# Patient Record
Sex: Male | Born: 1965
Health system: Southern US, Community
[De-identification: ages and names within clinical notes are randomized; demographics above are authoritative.]

## PROBLEM LIST (undated history)

## (undated) DIAGNOSIS — I10 Essential (primary) hypertension: Secondary | ICD-10-CM

## (undated) DIAGNOSIS — E785 Hyperlipidemia, unspecified: Secondary | ICD-10-CM

## (undated) DIAGNOSIS — I2510 Atherosclerotic heart disease of native coronary artery without angina pectoris: Secondary | ICD-10-CM

## (undated) HISTORY — PX: RETINAL DETACHMENT SURGERY: SHX105

## (undated) HISTORY — DX: Essential (primary) hypertension: I10

## (undated) HISTORY — DX: Hyperlipidemia, unspecified: E78.5

## (undated) HISTORY — PX: OTHER SURGICAL HISTORY: SHX169

## (undated) HISTORY — PX: RHINOPLASTY: SUR1284

## (undated) HISTORY — PX: TONSILLECTOMY AND ADENOIDECTOMY: SHX28

---

## 1999-02-22 ENCOUNTER — Other Ambulatory Visit: Admission: RE | Admit: 1999-02-22 | Discharge: 1999-02-22 | Payer: Self-pay | Admitting: Gastroenterology

## 2001-07-31 ENCOUNTER — Emergency Department (HOSPITAL_COMMUNITY): Admission: EM | Admit: 2001-07-31 | Discharge: 2001-07-31 | Payer: Self-pay | Admitting: Emergency Medicine

## 2001-07-31 ENCOUNTER — Encounter: Payer: Self-pay | Admitting: Emergency Medicine

## 2004-05-22 ENCOUNTER — Ambulatory Visit: Payer: Self-pay | Admitting: Pulmonary Disease

## 2004-05-31 ENCOUNTER — Ambulatory Visit: Payer: Self-pay | Admitting: Pulmonary Disease

## 2004-06-25 ENCOUNTER — Ambulatory Visit: Payer: Self-pay | Admitting: Pulmonary Disease

## 2004-08-21 ENCOUNTER — Ambulatory Visit: Payer: Self-pay | Admitting: Pulmonary Disease

## 2004-09-26 ENCOUNTER — Ambulatory Visit: Payer: Self-pay | Admitting: Pulmonary Disease

## 2004-10-16 ENCOUNTER — Ambulatory Visit: Payer: Self-pay | Admitting: Cardiology

## 2004-10-22 ENCOUNTER — Ambulatory Visit: Payer: Self-pay | Admitting: Cardiology

## 2005-01-08 ENCOUNTER — Ambulatory Visit: Payer: Self-pay | Admitting: Internal Medicine

## 2005-01-17 ENCOUNTER — Ambulatory Visit: Payer: Self-pay | Admitting: Cardiology

## 2005-04-26 ENCOUNTER — Ambulatory Visit: Payer: Self-pay | Admitting: Internal Medicine

## 2005-05-29 ENCOUNTER — Ambulatory Visit: Payer: Self-pay | Admitting: Pulmonary Disease

## 2005-10-03 ENCOUNTER — Ambulatory Visit: Payer: Self-pay | Admitting: Pulmonary Disease

## 2005-10-08 ENCOUNTER — Ambulatory Visit: Payer: Self-pay | Admitting: Pulmonary Disease

## 2005-11-07 ENCOUNTER — Ambulatory Visit: Payer: Self-pay | Admitting: Gastroenterology

## 2005-11-13 ENCOUNTER — Ambulatory Visit: Payer: Self-pay | Admitting: Gastroenterology

## 2005-11-19 ENCOUNTER — Ambulatory Visit: Payer: Self-pay | Admitting: Gastroenterology

## 2006-04-24 ENCOUNTER — Ambulatory Visit: Payer: Self-pay | Admitting: Pulmonary Disease

## 2006-04-24 LAB — CONVERTED CEMR LAB
ALT: 46 units/L — ABNORMAL HIGH (ref 0–40)
AST: 41 units/L — ABNORMAL HIGH (ref 0–37)
Albumin: 4.2 g/dL (ref 3.5–5.2)
Alkaline Phosphatase: 68 units/L (ref 39–117)
BUN: 5 mg/dL — ABNORMAL LOW (ref 6–23)
Basophils Absolute: 0 10*3/uL (ref 0.0–0.1)
Basophils Relative: 0.1 % (ref 0.0–1.0)
Bilirubin, Direct: 0.2 mg/dL (ref 0.0–0.3)
CO2: 30 meq/L (ref 19–32)
Calcium: 8.7 mg/dL (ref 8.4–10.5)
Chloride: 105 meq/L (ref 96–112)
Cholesterol: 171 mg/dL (ref 0–200)
Creatinine, Ser: 0.7 mg/dL (ref 0.4–1.5)
Direct LDL: 97.9 mg/dL
Eosinophils Absolute: 0.1 10*3/uL (ref 0.0–0.6)
Eosinophils Relative: 1 % (ref 0.0–5.0)
GFR calc Af Amer: 162 mL/min
GFR calc non Af Amer: 134 mL/min
Glucose, Bld: 97 mg/dL (ref 70–99)
HCT: 44.6 % (ref 39.0–52.0)
HDL: 38.6 mg/dL — ABNORMAL LOW (ref >39.0)
Hemoglobin: 15.9 g/dL (ref 13.0–17.0)
Lymphocytes Relative: 26.1 % (ref 12.0–46.0)
MCHC: 35.7 g/dL (ref 30.0–36.0)
MCV: 92.6 fL (ref 78.0–100.0)
Monocytes Absolute: 0.2 10*3/uL (ref 0.2–0.7)
Monocytes Relative: 4.8 % (ref 3.0–11.0)
Neutro Abs: 3.5 10*3/uL (ref 1.4–7.7)
Neutrophils Relative %: 68 % (ref 43.0–77.0)
Platelets: 142 10*3/uL — ABNORMAL LOW (ref 150–400)
Potassium: 4 meq/L (ref 3.5–5.1)
RBC: 4.81 M/uL (ref 4.22–5.81)
RDW: 11.6 % (ref 11.5–14.6)
Sodium: 141 meq/L (ref 135–145)
TSH: 0.87 microintl units/mL (ref 0.35–5.50)
Total Bilirubin: 0.7 mg/dL (ref 0.3–1.2)
Total CHOL/HDL Ratio: 4.4
Total Protein: 6.9 g/dL (ref 6.0–8.3)
Triglycerides: 367 mg/dL (ref 0–149)
VLDL: 73 mg/dL — ABNORMAL HIGH (ref 0–40)
WBC: 5.2 10*3/uL (ref 4.5–10.5)

## 2006-05-07 ENCOUNTER — Ambulatory Visit: Payer: Self-pay | Admitting: Gastroenterology

## 2006-11-03 ENCOUNTER — Ambulatory Visit: Payer: Self-pay | Admitting: Gastroenterology

## 2006-11-14 ENCOUNTER — Encounter: Payer: Self-pay | Admitting: Gastroenterology

## 2006-11-14 ENCOUNTER — Ambulatory Visit: Payer: Self-pay | Admitting: Pulmonary Disease

## 2006-11-14 ENCOUNTER — Ambulatory Visit: Payer: Self-pay | Admitting: Gastroenterology

## 2006-11-14 LAB — CONVERTED CEMR LAB
ALT: 47 units/L (ref 0–53)
AST: 36 units/L (ref 0–37)
Albumin: 4.6 g/dL (ref 3.5–5.2)
Alkaline Phosphatase: 65 units/L (ref 39–117)
Bilirubin, Direct: 0.2 mg/dL (ref 0.0–0.3)
Cholesterol: 206 mg/dL (ref 0–200)
Direct LDL: 167.2 mg/dL
HDL: 30.7 mg/dL — ABNORMAL LOW (ref >39.0)
Total Bilirubin: 1.1 mg/dL (ref 0.3–1.2)
Total CHOL/HDL Ratio: 6.7
Total Protein: 7.8 g/dL (ref 6.0–8.3)
Triglycerides: 215 mg/dL (ref 0–149)
VLDL: 43 mg/dL — ABNORMAL HIGH (ref 0–40)

## 2007-11-06 ENCOUNTER — Telehealth: Payer: Self-pay | Admitting: Pulmonary Disease

## 2007-11-11 DIAGNOSIS — I10 Essential (primary) hypertension: Secondary | ICD-10-CM | POA: Insufficient documentation

## 2007-11-11 DIAGNOSIS — E785 Hyperlipidemia, unspecified: Secondary | ICD-10-CM | POA: Insufficient documentation

## 2007-11-11 DIAGNOSIS — K219 Gastro-esophageal reflux disease without esophagitis: Secondary | ICD-10-CM | POA: Insufficient documentation

## 2007-11-11 DIAGNOSIS — F419 Anxiety disorder, unspecified: Secondary | ICD-10-CM | POA: Insufficient documentation

## 2007-11-11 DIAGNOSIS — J209 Acute bronchitis, unspecified: Secondary | ICD-10-CM | POA: Insufficient documentation

## 2007-11-11 DIAGNOSIS — F411 Generalized anxiety disorder: Secondary | ICD-10-CM | POA: Insufficient documentation

## 2007-11-12 ENCOUNTER — Ambulatory Visit: Payer: Self-pay | Admitting: Pulmonary Disease

## 2007-11-15 DIAGNOSIS — M542 Cervicalgia: Secondary | ICD-10-CM | POA: Insufficient documentation

## 2007-11-15 DIAGNOSIS — N4889 Other specified disorders of penis: Secondary | ICD-10-CM | POA: Insufficient documentation

## 2007-11-15 DIAGNOSIS — N486 Induration penis plastica: Secondary | ICD-10-CM | POA: Insufficient documentation

## 2007-11-15 DIAGNOSIS — Z86010 Personal history of colonic polyps: Secondary | ICD-10-CM | POA: Insufficient documentation

## 2007-11-15 DIAGNOSIS — D126 Benign neoplasm of colon, unspecified: Secondary | ICD-10-CM | POA: Insufficient documentation

## 2007-11-16 LAB — CONVERTED CEMR LAB
ALT: 60 units/L — ABNORMAL HIGH (ref 0–53)
AST: 36 units/L (ref 0–37)
Albumin: 4.7 g/dL (ref 3.5–5.2)
Alkaline Phosphatase: 32 units/L — ABNORMAL LOW (ref 39–117)
BUN: 9 mg/dL (ref 6–23)
Basophils Absolute: 0.1 10*3/uL (ref 0.0–0.1)
Basophils Relative: 1.4 % (ref 0.0–3.0)
Bilirubin, Direct: 0.1 mg/dL (ref 0.0–0.3)
CO2: 29 meq/L (ref 19–32)
Calcium: 9.5 mg/dL (ref 8.4–10.5)
Chloride: 104 meq/L (ref 96–112)
Cholesterol: 293 mg/dL (ref 0–200)
Creatinine, Ser: 1.1 mg/dL (ref 0.4–1.5)
Direct LDL: 187.9 mg/dL
Eosinophils Absolute: 0.1 10*3/uL (ref 0.0–0.7)
Eosinophils Relative: 1.3 % (ref 0.0–5.0)
GFR calc Af Amer: 95 mL/min
GFR calc non Af Amer: 79 mL/min
Glucose, Bld: 102 mg/dL — ABNORMAL HIGH (ref 70–99)
HCT: 45.6 % (ref 39.0–52.0)
HDL: 36.1 mg/dL — ABNORMAL LOW (ref >39.0)
Hemoglobin: 16.1 g/dL (ref 13.0–17.0)
Lymphocytes Relative: 18.5 % (ref 12.0–46.0)
MCHC: 35.3 g/dL (ref 30.0–36.0)
MCV: 96 fL (ref 78.0–100.0)
Monocytes Absolute: 0.4 10*3/uL (ref 0.1–1.0)
Monocytes Relative: 7.5 % (ref 3.0–12.0)
Neutro Abs: 4 10*3/uL (ref 1.4–7.7)
Neutrophils Relative %: 71.3 % (ref 43.0–77.0)
Platelets: 142 10*3/uL — ABNORMAL LOW (ref 150–400)
Potassium: 4.1 meq/L (ref 3.5–5.1)
RBC: 4.75 M/uL (ref 4.22–5.81)
RDW: 11.2 % — ABNORMAL LOW (ref 11.5–14.6)
Sodium: 139 meq/L (ref 135–145)
TSH: 1.14 microintl units/mL (ref 0.35–5.50)
Total Bilirubin: 1.4 mg/dL — ABNORMAL HIGH (ref 0.3–1.2)
Total CHOL/HDL Ratio: 8.1
Total Protein: 7.6 g/dL (ref 6.0–8.3)
Triglycerides: 280 mg/dL (ref 0–149)
VLDL: 56 mg/dL — ABNORMAL HIGH (ref 0–40)
WBC: 5.7 10*3/uL (ref 4.5–10.5)

## 2007-12-16 ENCOUNTER — Telehealth: Payer: Self-pay | Admitting: Pulmonary Disease

## 2009-02-01 ENCOUNTER — Telehealth: Payer: Self-pay | Admitting: Pulmonary Disease

## 2009-05-03 ENCOUNTER — Ambulatory Visit: Payer: Self-pay | Admitting: Pulmonary Disease

## 2009-05-04 ENCOUNTER — Ambulatory Visit: Payer: Self-pay | Admitting: Pulmonary Disease

## 2009-05-08 LAB — CONVERTED CEMR LAB
ALT: 41 units/L (ref 0–53)
AST: 30 units/L (ref 0–37)
Albumin: 4.3 g/dL (ref 3.5–5.2)
Alkaline Phosphatase: 57 units/L (ref 39–117)
BUN: 5 mg/dL — ABNORMAL LOW (ref 6–23)
Basophils Absolute: 0 10*3/uL (ref 0.0–0.1)
Basophils Relative: 0.8 % (ref 0.0–3.0)
Bilirubin Urine: NEGATIVE
Bilirubin, Direct: 0.2 mg/dL (ref 0.0–0.3)
CO2: 31 meq/L (ref 19–32)
Calcium: 9 mg/dL (ref 8.4–10.5)
Chloride: 105 meq/L (ref 96–112)
Cholesterol: 167 mg/dL (ref 0–200)
Creatinine, Ser: 0.8 mg/dL (ref 0.4–1.5)
Direct LDL: 86.9 mg/dL
Eosinophils Absolute: 0.1 10*3/uL (ref 0.0–0.7)
Eosinophils Relative: 1.6 % (ref 0.0–5.0)
GFR calc non Af Amer: 112.87 mL/min (ref >60)
Glucose, Bld: 97 mg/dL (ref 70–99)
HCT: 45.1 % (ref 39.0–52.0)
HDL: 39.7 mg/dL (ref >39.00)
Hemoglobin, Urine: NEGATIVE
Hemoglobin: 15.4 g/dL (ref 13.0–17.0)
Ketones, ur: NEGATIVE mg/dL
Leukocytes, UA: NEGATIVE
Lymphocytes Relative: 31 % (ref 12.0–46.0)
Lymphs Abs: 1.3 10*3/uL (ref 0.7–4.0)
MCHC: 34.1 g/dL (ref 30.0–36.0)
MCV: 97.5 fL (ref 78.0–100.0)
Monocytes Absolute: 0.4 10*3/uL (ref 0.1–1.0)
Monocytes Relative: 8.4 % (ref 3.0–12.0)
Neutro Abs: 2.5 10*3/uL (ref 1.4–7.7)
Neutrophils Relative %: 58.2 % (ref 43.0–77.0)
Nitrite: NEGATIVE
Platelets: 166 10*3/uL (ref 150.0–400.0)
Potassium: 4 meq/L (ref 3.5–5.1)
RBC: 4.62 M/uL (ref 4.22–5.81)
RDW: 11.4 % — ABNORMAL LOW (ref 11.5–14.6)
Sodium: 143 meq/L (ref 135–145)
Specific Gravity, Urine: 1.02 (ref 1.000–1.030)
TSH: 1.43 microintl units/mL (ref 0.35–5.50)
Total Bilirubin: 0.8 mg/dL (ref 0.3–1.2)
Total CHOL/HDL Ratio: 4
Total Protein, Urine: NEGATIVE mg/dL
Total Protein: 7.1 g/dL (ref 6.0–8.3)
Triglycerides: 410 mg/dL — ABNORMAL HIGH (ref 0.0–149.0)
Urine Glucose: NEGATIVE mg/dL
Urobilinogen, UA: 0.2 (ref 0.0–1.0)
VLDL: 82 mg/dL — ABNORMAL HIGH (ref 0.0–40.0)
WBC: 4.3 10*3/uL — ABNORMAL LOW (ref 4.5–10.5)
pH: 5.5 (ref 5.0–8.0)

## 2010-04-17 NOTE — Assessment & Plan Note (Signed)
Summary: cpx/fasting/apc   CC:  18 month ROV & CPX....  History of Present Illness: 45 y/o WM here for a follow up visit and CPX... he states that he is feeling well, and has no new complaints or concerns today... he does not take Flu shots & requests refills for 2011.    Current Problem List:  ASTHMATIC BRONCHITIS, ACUTE (ICD-466.0) - no recent exas and doing well- denies cough, sputum, hemoptysis, worsening dyspnea,  wheezing, chest pains, snoring, daytime hypersomnolence, etc...  HYPERTENSION (ICD-401.9) - on ASA 81mg /d,  TOPROL XL 50mg /d... BP today= 120/82, home BPs are <140 he says... denies HA, fatigue, visual changes, CP, palipit, dizziness, syncope, dyspnea, edema, etc... we discussed taking med daily & continue home monitoring...  HYPERLIPIDEMIA (ICD-272.4) - on PRAVACHOL 80mg /d now... [facility].  ~  FLP 8/08 ?on Prav40 & Antara130 showed TChol 206, TG 215, HDL 31, LDL 167  ~  FLP 8/09 ?on Prav40 showed TChol 293, TG 280, HDL 36, LDL 188... rec> incr Prav80.  ~  FLP 2/11 on Prav80 showed TChol 167, TG 410, HDL 40, LDL 87... rec> add Fenofib160.  GERD (ICD-530.81) - on PROTONIX 40mg /d...  ~  last EGD 9/07 by Dr Arlyce Dice was WNL...  COLONIC POLYPS (ICD-211.3) - colonoscopy 8/08 by Dorris Singh showed several 5mm polyps, otherw neg... bx= adenomatous w/ f/u planned 53yrs.  PEYRONIE'S DISEASE (ICD-607.89) - he saw DrDahlstedt in 1998 for poss early peyronies...  Hx of NECK PAIN (ICD-723.1) - eval by DrWeymann in 2005 for paresthesias... ?MRI's done- we never received f/u note.  ANXIETY (ICD-300.00) - on ALPRAZOLAM 0.5mg  Prn...  Health Maintenance:    ~  GI:  had colon 8/08 by Dorris Singh w/ 5mm polyp, f/u planned 29yrs...  ~  GU:  prostate exam= norm, 2+ no nodules, stool heme neg...  ~  Immunizations:  ?last tetanus shot, he refuses Flu vaccines...   Allergies: 1)  ! Codeine 2)  ! Penicillin 3)  ! Crestor (Rosuvastatin Calcium) 4)  ! Niaspan (Niacin  (Antihyperlipidemic))  Comments:  Nurse/Medical Assistant: The patient's medications and allergies were reviewed with the patient and were updated in the Medication and Allergy Lists.  Past History:  Past Medical History:  ASTHMATIC BRONCHITIS, ACUTE (ICD-466.0) HYPERTENSION (ICD-401.9) HYPERLIPIDEMIA (ICD-272.4) GERD (ICD-530.81) COLONIC POLYPS (ICD-211.3) PEYRONIE'S DISEASE (ICD-607.89) Hx of NECK PAIN (ICD-723.1) ANXIETY (ICD-300.00)  Past Surgical History: S/P vasectomy by DrTannenbaum  Family History: Reviewed history from 11/12/2007 and no changes required. mother alive age 35 father alive age 43 1 sibling alive age 60  Social History: Reviewed history from 11/12/2007 and no changes required. employed at [employer] non smoker - exposed to second hand smoke exercises 3 times per week no caffeine use etoh---social married 3 children  Review of Systems  The patient denies fever, chills, sweats, anorexia, fatigue, weakness, malaise, weight loss, sleep disorder, blurring, diplopia, eye irritation, eye discharge, vision loss, eye pain, photophobia, earache, ear discharge, tinnitus, decreased hearing, nasal congestion, nosebleeds, sore throat, hoarseness, chest pain, palpitations, syncope, dyspnea on exertion, orthopnea, PND, peripheral edema, cough, dyspnea at rest, excessive sputum, hemoptysis, wheezing, pleurisy, nausea, vomiting, diarrhea, constipation, change in bowel habits, abdominal pain, melena, hematochezia, jaundice, gas/bloating, indigestion/heartburn, dysphagia, odynophagia, dysuria, hematuria, urinary frequency, urinary hesitancy, nocturia, incontinence, back pain, joint pain, joint swelling, muscle cramps, muscle weakness, stiffness, arthritis, sciatica, restless legs, leg pain at night, leg pain with exertion, rash, itching, dryness, suspicious lesions, paralysis, paresthesias, seizures, tremors, vertigo, transient blindness, frequent falls, frequent  headaches, difficulty walking, depression,  anxiety, memory loss, confusion, cold intolerance, heat intolerance, polydipsia, polyphagia, polyuria, unusual weight change, abnormal bruising, bleeding, enlarged lymph nodes, urticaria, allergic rash, hay fever, and recurrent infections.    Vital Signs:  Patient profile:   45 year old male Height:      71 inches Weight:      175 pounds BMI:     24.50 O2 Sat:      97 % on Room air Temp:     99.1 degrees F oral Pulse rate:   88 / minute BP sitting:   120 / 82  (right arm) Cuff size:   regular  Vitals Entered By: Randell Loop CMA (May 03, 2009 9:36 AM)  O2 Sat at Rest %:  97 O2 Flow:  Room air CC: 18 month ROV & CPX... Is Patient Diabetic? No Pain Assessment Patient in pain? no      Comments no changes in meds today   Physical Exam  Additional Exam:  WD, WN, 45 y/o WM in NAD... GENERAL:  Alert & oriented; pleasant & cooperative... HEENT:  [place]/AT, EOM-wnl, PERRLA, Fundi-benign, EACs-clear, TMs-wnl, NOSE-clear, THROAT-clear & wnl. NECK:  Supple w/ full ROM; no JVD; normal carotid impulses w/o bruits; no thyromegaly or nodules palpated; no lymphadenopathy. CHEST:  Clear to P & A; without wheezes/ rales/ or rhonchi. HEART:  Regular Rhythm; without murmurs/ rubs/ or gallops. ABDOMEN:  Soft & nontender; normal bowel sounds; no organomegaly or masses detected. RECTAL:  Neg - prostate 2+ & nontender w/o nodules; stool hematest neg. EXT: without deformities or arthritic changes; no varicose veins/ venous insuffic/ or edema. NEURO:  CN's intact; motor testing normal; sensory testing normal; gait normal & balance OK. DERM:  No lesions noted; no rash etc...    CXR  Procedure date:  05/03/2009  Findings:      CHEST - 2 VIEW Comparison: 11/12/2007   Findings:  The heart size and mediastinal contours are within normal limits.  Both lungs are clear.  The visualized skeletal structures are unremarkable.   IMPRESSION: No active  cardiopulmonary disease.   Read By:  Sigurd Sos.,  M.D.   EKG  Procedure date:  05/03/2009  Findings:      Normal sinus rhythm with rate of:  82/min... Tracing is WNL w/ minor NSSTTWA... NAD... SN   MISC. Report  Procedure date:  05/04/2009  Findings:      BMP (METABOL)   Sodium                    143 mEq/L                   135-145   Potassium                 4.0 mEq/L                   3.5-5.1   Chloride                  105 mEq/L                   96-112   Carbon Dioxide            31 mEq/L                    19-32   Glucose                   97 mg/dL  70-99   BUN                  [L]  5 mg/dL                     1-61   Creatinine                0.8 mg/dL                   0.9-6.0   Calcium                   9.0 mg/dL                   4.5-40.9   GFR                       112.87 mL/min               >60  Lipid Panel (LIPID)   Cholesterol               167 mg/dL                   8-119   Triglycerides        [H]  410.0 mg/dL                 1.4-782.9   HDL                       56.21 mg/dL                 >30.86 Cholesterol LDL - Direct                             86.9 mg/dL  CBC Platelet w/Diff (CBCD)   White Cell Count     [L]  4.3 K/uL                    4.5-10.5   Red Cell Count            4.62 Mil/uL                 4.22-5.81   Hemoglobin                15.4 g/dL                   57.8-46.9   Hematocrit                45.1 %                      39.0-52.0   MCV                       97.5 fl                     78.0-100.0   Platelet Count            166.0 K/uL                  150.0-400.0   Neutrophil %              58.2 %                      43.0-77.0  Lymphocyte %              31.0 %                      12.0-46.0   Monocyte %                8.4 %                       3.0-12.0   Eosinophils%              1.6 %                       0.0-5.0   Basophils %               0.8 %    Comments:      Hepatic/Liver Function Panel (HEPATIC)    Total Bilirubin           0.8 mg/dL                   2.1-3.0   Direct Bilirubin          0.2 mg/dL                   8.6-5.7   Alkaline Phosphatase      57 U/L                      39-117   AST                       30 U/L                      0-37   ALT                       41 U/L                      0-53   Total Protein             7.1 g/dL                    8.4-6.9   Albumin                   4.3 g/dL                    6.2-9.5  TSH (TSH)   FastTSH                   1.43 uIU/mL                 0.35-5.50  UDip Only (UDIP)   Color                     YELLOW   Clarity                   CLEAR                       Clear   Specific Gravity          1.020                       1.000 - 1.030   Urine Ph  5.5                         5.0-8.0   Protein                   NEGATIVE                    Negative   Urine Glucose             NEGATIVE                    Negative   Ketones                   NEGATIVE                    Negative   Urine Bilirubin           NEGATIVE                    Negative   Blood                     NEGATIVE                    Negative   Urobilinogen              0.2                         0.0 - 1.0   Leukocyte Esterace        NEGATIVE                    Negative   Nitrite                   NEGATIVE                    Negative   Impression & Recommendations:  Problem # 1:  PHYSICAL EXAMINATION (ICD-V70.0)  Orders: EKG w/ Interpretation (93000) T-2 View CXR (71020TC) He will ret for Fasting labs...  Problem # 2:  HYPERTENSION (ICD-401.9) Controlled-  same meds. His updated medication list for this problem includes:    Toprol Xl 50 Mg Xr24h-tab (Metoprolol succinate) .Marland Kitchen... Take 1 by mouth once daily  Problem # 3:  HYPERLIPIDEMIA (ICD-272.4) Improved on Prav80 + diet-  continue same. His updated medication list for this problem includes:    Pravachol 80 Mg Tabs (Pravastatin sodium) .Marland Kitchen... Take one tablet by mouth at bedtime    Fenofibrate  160 Mg Tabs (Fenofibrate) .Marland Kitchen... Take 1 tablet by mouth once a day  Problem # 4:  GERD (ICD-530.81) GI is stable on the Protonix... His updated medication list for this problem includes:    Protonix 40 Mg Tbec (Pantoprazole sodium) .Marland Kitchen... Take 1 tablet by mouth once a day  Problem # 5:  COLONIC POLYPS (ICD-211.3) F/u colon due 2013...  Problem # 6:  OTHER MEDICAL PROBLEMS AS NOTED>>>  Complete Medication List: 1)  Toprol Xl 50 Mg Xr24h-tab (Metoprolol succinate) .... Take 1 by mouth once daily 2)  Pravachol 80 Mg Tabs (Pravastatin sodium) .... Take one tablet by mouth at bedtime 3)  Protonix 40 Mg Tbec (Pantoprazole sodium) .... Take 1 tablet by mouth once a day 4)  Multivitamins Tabs (Multiple vitamin) .... Take 1 tablet by mouth once a day 5)  Alprazolam 0.5  Mg Tabs (Alprazolam) .... Take 1/2 to 1 tablet three times a day as needed 6)  Fenofibrate 160 Mg Tabs (Fenofibrate) .... Take 1 tablet by mouth once a day  Other Orders: Prescription Created Electronically [id number])  Patient Instructions: 1)  Today we updated your med list- see below.... 2)  We refilled your meds for 2011... 3)  Today we did your follow up CXR & EKG... 4)  Please return to our lab in the AM for your FASTING blood work... 5)  then please call the "phone tree" in a few days for your lab results.Marland KitchenMarland Kitchen 6)  Be sure to follow that low cholesterol/ low fat diet.Marland KitchenMarland Kitchen 7)  Call for any questions... 8)  Please schedule a follow-up appointment in 1 year. Prescriptions: ALPRAZOLAM 0.5 MG TABS (ALPRAZOLAM) Take 1/2 to 1 tablet three times a day as needed  #100 x prn   Entered and Authorized by:   Michele Mcalpine MD   Signed by:   Michele Mcalpine MD on 05/03/2009   Method used:   Print then Give to Patient   RxID:   [id number] PROTONIX 40 MG TBEC (PANTOPRAZOLE SODIUM) Take 1 tablet by mouth once a day  #30 x prn   Entered and Authorized by:   Michele Mcalpine MD   Signed by:   Michele Mcalpine MD on 05/03/2009   Method used:    Print then Give to Patient   RxID:   [id number] PRAVACHOL 80 MG TABS (PRAVASTATIN SODIUM) take one tablet by mouth at bedtime  #30 x prn   Entered and Authorized by:   Michele Mcalpine MD   Signed by:   Michele Mcalpine MD on 05/03/2009   Method used:   Print then Give to Patient   RxID:   [id number] TOPROL XL 50 MG XR24H-TAB (METOPROLOL SUCCINATE) take 1 by mouth once daily  #30 x prn   Entered and Authorized by:   Michele Mcalpine MD   Signed by:   Michele Mcalpine MD on 05/03/2009   Method used:   Print then Give to Patient   RxID:   [id number]     CardioPerfect ECG  ID: [id number] Patient: Kathe Becton A DOB: June 19, 1965 Age: 45 Years Old Sex: Male Race: White Physician: Minnie Shi Technician: Randell Loop CMA Height: 71 Weight: 175 Status: Unconfirmed Past Medical History:   ASTHMATIC BRONCHITIS, ACUTE (ICD-466.0) HYPERTENSION (ICD-401.9) HYPERLIPIDEMIA (ICD-272.4) GERD (ICD-530.81) COLONIC POLYPS (ICD-211.3) PEYRONIE'S DISEASE (ICD-607.89) Hx of NECK PAIN (ICD-723.1) ANXIETY (ICD-300.00)   Recorded: 05/03/2009 09:55 AM P/PR: 112 ms / 155 ms - Heart rate (maximum exercise) QRS: 85 QT/QTc/QTd: 363 ms / 402 ms / 47 ms - Heart rate (maximum exercise)  P/QRS/T axis: 50 deg / 34 deg / 44 deg - Heart rate (maximum exercise)  Heartrate: 82 bpm  Interpretation:  Normal sinus rhythm with rate of:  82/min... Tracing is WNL w/ minor NSSTTWA... NAD... SN

## 2010-05-23 ENCOUNTER — Other Ambulatory Visit: Payer: Self-pay | Admitting: Pulmonary Disease

## 2010-05-23 ENCOUNTER — Encounter: Payer: Self-pay | Admitting: Pulmonary Disease

## 2010-05-23 ENCOUNTER — Encounter (INDEPENDENT_AMBULATORY_CARE_PROVIDER_SITE_OTHER): Payer: Private Health Insurance - Indemnity | Admitting: Pulmonary Disease

## 2010-05-23 ENCOUNTER — Other Ambulatory Visit: Payer: Private Health Insurance - Indemnity

## 2010-05-23 ENCOUNTER — Ambulatory Visit (INDEPENDENT_AMBULATORY_CARE_PROVIDER_SITE_OTHER)
Admission: RE | Admit: 2010-05-23 | Discharge: 2010-05-23 | Disposition: A | Discharge status: Home / Self Care | Payer: Private Health Insurance - Indemnity | Source: Ambulatory Visit | Attending: Pulmonary Disease | Admitting: Pulmonary Disease

## 2010-05-23 DIAGNOSIS — H349 Unspecified retinal vascular occlusion: Secondary | ICD-10-CM | POA: Insufficient documentation

## 2010-05-23 DIAGNOSIS — Z0000 Encounter for general adult medical examination without abnormal findings: Secondary | ICD-10-CM

## 2010-05-23 DIAGNOSIS — E785 Hyperlipidemia, unspecified: Secondary | ICD-10-CM

## 2010-05-23 LAB — CBC WITH DIFFERENTIAL/PLATELET
Basophils Absolute: 0 10*3/uL (ref 0.0–0.1)
Eosinophils Relative: 0.7 % (ref 0.0–5.0)
Lymphs Abs: 1.1 10*3/uL (ref 0.7–4.0)
Monocytes Absolute: 0.6 10*3/uL (ref 0.1–1.0)
Monocytes Relative: 8.6 % (ref 3.0–12.0)
Neutrophils Relative %: 74.3 % (ref 43.0–77.0)
Platelets: 172 10*3/uL (ref 150.0–400.0)
RDW: 11.8 % (ref 11.5–14.6)
WBC: 7 10*3/uL (ref 4.5–10.5)

## 2010-05-23 LAB — LDL CHOLESTEROL, DIRECT: Direct LDL: 149.6 mg/dL

## 2010-05-23 LAB — LIPID PANEL
Cholesterol: 211 mg/dL — ABNORMAL HIGH (ref 0–200)
Total CHOL/HDL Ratio: 7
Triglycerides: 313 mg/dL — ABNORMAL HIGH (ref 0.0–149.0)
VLDL: 62.6 mg/dL — ABNORMAL HIGH (ref 0.0–40.0)

## 2010-05-23 LAB — URINALYSIS, ROUTINE W REFLEX MICROSCOPIC
Hgb urine dipstick: NEGATIVE
Nitrite: NEGATIVE
Total Protein, Urine: NEGATIVE
Urine Glucose: NEGATIVE
pH: 6.5 (ref 5.0–8.0)

## 2010-05-23 LAB — HEPATIC FUNCTION PANEL
ALT: 73 U/L — ABNORMAL HIGH (ref 0–53)
AST: 85 U/L — ABNORMAL HIGH (ref 0–37)
Albumin: 4.7 g/dL (ref 3.5–5.2)
Alkaline Phosphatase: 41 U/L (ref 39–117)

## 2010-05-23 LAB — BASIC METABOLIC PANEL
BUN: 8 mg/dL (ref 6–23)
CO2: 27 mEq/L (ref 19–32)
Chloride: 101 mEq/L (ref 96–112)
Glucose, Bld: 139 mg/dL — ABNORMAL HIGH (ref 70–99)
Potassium: 4.7 mEq/L (ref 3.5–5.1)
Sodium: 138 mEq/L (ref 135–145)

## 2010-05-28 ENCOUNTER — Other Ambulatory Visit: Payer: Self-pay | Admitting: Pulmonary Disease

## 2010-05-28 ENCOUNTER — Encounter: Payer: Self-pay | Admitting: Pulmonary Disease

## 2010-05-28 DIAGNOSIS — R911 Solitary pulmonary nodule: Secondary | ICD-10-CM

## 2010-05-31 ENCOUNTER — Ambulatory Visit (INDEPENDENT_AMBULATORY_CARE_PROVIDER_SITE_OTHER)
Admission: RE | Admit: 2010-05-31 | Discharge: 2010-05-31 | Disposition: A | Discharge status: Home / Self Care | Payer: Private Health Insurance - Indemnity | Source: Ambulatory Visit | Attending: Pulmonary Disease | Admitting: Pulmonary Disease

## 2010-05-31 DIAGNOSIS — R911 Solitary pulmonary nodule: Secondary | ICD-10-CM

## 2010-05-31 DIAGNOSIS — J984 Other disorders of lung: Secondary | ICD-10-CM

## 2010-05-31 MED ORDER — IOHEXOL 300 MG/ML  SOLN
80.0000 mL | Freq: Once | INTRAMUSCULAR | Status: AC | PRN
  Administered 2010-05-31: 80 mL via INTRAVENOUS

## 2010-06-04 ENCOUNTER — Telehealth: Payer: Self-pay | Admitting: Pulmonary Disease

## 2010-06-04 ENCOUNTER — Other Ambulatory Visit: Payer: Self-pay | Admitting: Pulmonary Disease

## 2010-06-04 DIAGNOSIS — R911 Solitary pulmonary nodule: Secondary | ICD-10-CM

## 2010-06-05 NOTE — Assessment & Plan Note (Signed)
Summary: CPX//SH   CC:  Yearly ROV & CPX....  History of Present Illness: 45 y/o WM here for a follow up visit and CPX...   ~  May 23, 2010:  Carlos Montgomery reports an episode of blurry vision involving his right eye in 2011; he went to see his Optometrist [name] who saw a "bl clot behind my eye" & [name] who dx a ?central retinal vein occlusion (we do not have records);  treated initially w/ series of 3 injections (no benefit), then laser surg & it has stabilized; pt says "it's from HBP" but his BP has been mild & easily treated w/ simple med (on ToprolXL50 & BP last yr 120/82, this yr 132/84, & his hi reading at home was 138/90);  Omran does not have prev hx blood clots, no known hypercoag state, he is not obese, not a known diabetic (FBS=97 last yr), & an ex-smoker (quit 2000)- neg FamHx for blood clots or similar problems...  he does have a hx of a mixed hyperlipidemia on PRAV80 & FENOFIB160> while his Chol numbers have been good (TChol=167, LDL=87 last yr), his TG's have been elevated (TG 2/11 = 410) & he has struggled w/ low fat diet...  f/u fasting labs ==> pending   Current Problem List  RETINAL VEIN OCCLUSION, Right Eye (ICD-362.30) >> SEE ABOVE  ASTHMATIC BRONCHITIS, ACUTE (ICD-466.0) - no recent exac and doing well- denies cough, sputum, hemoptysis, worsening dyspnea,  wheezing, chest pains, snoring, daytime hypersomnolence, etc...  HYPERTENSION (ICD-401.9) - on ASA 81mg /d,  TOPROL XL 50mg /d... BP today= 132/84, home BPs are <140 he says... denies HA, fatigue, CP, palipit, dizziness, syncope, dyspnea, edema, etc... we discussed taking med daily & continue home monitoring...  HYPERLIPIDEMIA (ICD-272.4) - on PRAVACHOL 80mg /d & FENOFIBRATE 160mg /d... prev [facility] & INTOL to Crestor & Niaspan...  ~  FLP 8/08 ?on Prav40 & Antara130 showed TChol 206, TG 215, HDL 31, LDL 167  ~  FLP 8/09 ?on Prav40 showed TChol 293, TG 280, HDL 36, LDL 188... rec>  incr Prav80.  ~  FLP 2/11 on Prav80 showed TChol 167, TG 410, HDL 40, LDL 87... rec> add Fenofib160.  ~  FLP 3/12 on Prav80+Feno160 showed ==> pending  GERD (ICD-530.81) - on PROTONIX 40mg /d...  ~  last EGD 9/07 by Dr Arlyce Dice was WNL...  COLONIC POLYPS (ICD-211.3) - colonoscopy 8/08 by Dorris Singh showed several 5mm polyps, otherw neg... bx= adenomatous w/ f/u planned 84yrs.  PEYRONIE'S DISEASE (ICD-607.89) - he saw DrDahlstedt in 1998 for poss early peyronies...  Hx of NECK PAIN (ICD-723.1) - eval by DrWeymann in 2005 for paresthesias... ?MRI's done- we never received f/u note.  ANXIETY (ICD-300.00) - on ALPRAZOLAM 0.5mg  Prn...  Health Maintenance:    ~  GI:  had colon 8/08 by Dorris Singh w/ 5mm polyp, f/u planned 10yrs...  ~  GU:  prostate exam= norm, 2+ no nodules, stool heme neg...  ~  Immunizations:  ?last tetanus shot, he refuses Flu vaccines...   Preventive Screening-Counseling & Management  Alcohol-Tobacco     Smoking Status: quit     Year Quit: 2000  Allergies: 1)  ! Codeine 2)  ! Penicillin 3)  ! Crestor (Rosuvastatin Calcium) 4)  ! Niaspan (Niacin (Antihyperlipidemic))  Comments:  Nurse/Medical Assistant: The patient's medications and allergies were reviewed with the patient and were updated in the Medication and Allergy Lists.  Past History:  Past Medical History: RETINAL VEIN OCCLUSION (ICD-362.30) ASTHMATIC BRONCHITIS, ACUTE (ICD-466.0) HYPERTENSION (  ICD-401.9) HYPERLIPIDEMIA (ICD-272.4) GERD (ICD-530.81) COLONIC POLYPS (ICD-211.3) PEYRONIE'S DISEASE (ICD-607.89) Hx of NECK PAIN (ICD-723.1) ANXIETY (ICD-300.00)  Family History: Reviewed history from 11/12/2007 and no changes required. mother alive age 87 father alive age 15 1 sibling alive age 55  Social History: Reviewed history from 11/12/2007 and no changes required. employed at [employer] non smoker - exposed to second hand smoke exercises 3 times per week no caffeine  use etoh---social married 3 children  Review of Systems       The patient complains of blurring, vision loss, and anxiety.  The patient denies fever, chills, sweats, anorexia, fatigue, weakness, malaise, weight loss, sleep disorder, diplopia, eye irritation, eye discharge, eye pain, photophobia, earache, ear discharge, tinnitus, decreased hearing, nasal congestion, nosebleeds, sore throat, hoarseness, chest pain, palpitations, syncope, dyspnea on exertion, orthopnea, PND, peripheral edema, cough, dyspnea at rest, excessive sputum, hemoptysis, wheezing, pleurisy, nausea, vomiting, diarrhea, constipation, change in bowel habits, abdominal pain, melena, hematochezia, jaundice, gas/bloating, indigestion/heartburn, dysphagia, odynophagia, dysuria, hematuria, urinary frequency, urinary hesitancy, nocturia, incontinence, back pain, joint pain, joint swelling, muscle cramps, muscle weakness, stiffness, arthritis, sciatica, restless legs, leg pain at night, leg pain with exertion, rash, itching, dryness, suspicious lesions, paralysis, paresthesias, seizures, tremors, vertigo, transient blindness, frequent falls, frequent headaches, difficulty walking, depression, memory loss, confusion, cold intolerance, heat intolerance, polydipsia, polyphagia, polyuria, unusual weight change, abnormal bruising, bleeding, enlarged lymph nodes, urticaria, allergic rash, hay fever, and recurrent infections.    Vital Signs:  Patient profile:   45 year old male Height:      71 inches Weight:      180.38 pounds BMI:     25.25 O2 Sat:      98 % on Room air Temp:     97.5 degrees F oral Pulse rate:   83 / minute BP sitting:   132 / 84  (right arm) Cuff size:   regular  Vitals Entered By: Randell Loop CMA (May 23, 2010 10:27 AM)  O2 Sat at Rest %:  98 O2 Flow:  Room air CC: Yearly ROV & CPX... Is Patient Diabetic? No Pain Assessment Patient in pain? no      Comments no changes in meds today   Physical  Exam  Additional Exam:  WD, WN, 45 y/o WM in NAD... GENERAL:  Alert & oriented; pleasant & cooperative... HEENT:  [place]/AT, EOM-wnl, Hx retinal vein occlusion rt eye, EACs-clear, TMs-wnl, NOSE-clear, THROAT-clear & wnl. NECK:  Supple w/ full ROM; no JVD; normal carotid impulses w/o bruits; no thyromegaly or nodules palpated; no lymphadenopathy. CHEST:  Clear to P & A; without wheezes/ rales/ or rhonchi. HEART:  Regular Rhythm; without murmurs/ rubs/ or gallops. ABDOMEN:  Soft & nontender; normal bowel sounds; no organomegaly or masses detected. RECTAL:  Neg - prostate 2+ & nontender w/o nodules; stool hematest neg. EXT: without deformities or arthritic changes; no varicose veins/ venous insuffic/ or edema. NEURO:  CN's intact; motor testing normal; sensory testing normal; gait normal & balance OK. DERM:  No lesions noted; no rash etc...    Impression & Recommendations:  Problem # 1:  PHYSICAL EXAMINATION (ICD-V70.0) BP appears to be satis, we will monitor for other risk factors w/ labs ==> pending. Orders: EKG w/ Interpretation (93000) T-2 View CXR (71020TC) TLB-BMP (Basic Metabolic Panel-BMET) (80048-METABOL) TLB-Hepatic/Liver Function Pnl (80076-HEPATIC) TLB-CBC Platelet - w/Differential (85025-CBCD) TLB-Lipid Panel (80061-LIPID) TLB-TSH (Thyroid Stimulating Hormone) (84443-TSH) TLB-Sedimentation Rate (ESR) (85652-ESR) TLB-Udip w/ Micro (81001-URINE)  Problem # 2:  HYPERTENSION (ICD-401.9) BP appears to be satis  but we will have pt check BPs at home & call for any elev readings... His updated medication list for this problem includes:    Toprol Xl 50 Mg Xr24h-tab (Metoprolol succinate) .Marland Kitchen... Take 1 by mouth once daily  Problem # 3:  HYPERLIPIDEMIA (ICD-272.4) On combination therapy> FLP ==> pending, we reviewed diet + exercise... His updated medication list for this problem includes:    Pravachol 80 Mg Tabs (Pravastatin sodium) .Marland Kitchen... Take one tablet by mouth at bedtime     Fenofibrate 160 Mg Tabs (Fenofibrate) .Marland Kitchen... Take 1 tablet by mouth once a day  Problem # 4:  GERD (ICD-530.81) Stable on PPI Rx>  continue same... His updated medication list for this problem includes:    Protonix 40 Mg Tbec (Pantoprazole sodium) .Marland Kitchen... Take 1 tablet by mouth once a day  Problem # 5:  COLONIC POLYPS (ICD-211.3) F/u colon due 2013 per DrKaplan...  Problem # 6:  ANXIETY (ICD-300.00) We will refill the Alpraz... His updated medication list for this problem includes:    Alprazolam 0.5 Mg Tabs (Alprazolam) .Marland Kitchen... Take 1/2 to 1 tablet three times a day as needed  Complete Medication List: 1)  Toprol Xl 50 Mg Xr24h-tab (Metoprolol succinate) .... Take 1 by mouth once daily 2)  Pravachol 80 Mg Tabs (Pravastatin sodium) .... Take one tablet by mouth at bedtime 3)  Fenofibrate 160 Mg Tabs (Fenofibrate) .... Take 1 tablet by mouth once a day 4)  Protonix 40 Mg Tbec (Pantoprazole sodium) .... Take 1 tablet by mouth once a day 5)  Multivitamins Tabs (Multiple vitamin) .... Take 1 tablet by mouth once a day 6)  Alprazolam 0.5 Mg Tabs (Alprazolam) .... Take 1/2 to 1 tablet three times a day as needed  Patient Instructions: 1)  Today we updated your med list- see below.... 2)  We refilled your meds for 2012... 3)  Today we did your follow up CXR, EKG, & fasting blood work... 4)  please call the "phone tree" in a few days for your lab results.Marland KitchenMarland Kitchen 5)  ask DrMatthews to send records to [place] for your chart... 6)  Continue to monitor your BP at home and call for any elevation.Marland KitchenMarland Kitchen 7)  Call for any problems... 8)  Please schedule a follow-up appointment in 1 year, sooner as needed... Prescriptions: ALPRAZOLAM 0.5 MG TABS (ALPRAZOLAM) Take 1/2 to 1 tablet three times a day as needed  #100 x 6   Entered and Authorized by:   Michele Mcalpine MD   Signed by:   Michele Mcalpine MD on 05/23/2010   Method used:   Print then Give to Patient   RxID:   [id number] PROTONIX 40 MG TBEC (PANTOPRAZOLE  SODIUM) Take 1 tablet by mouth once a day  #90 x 4   Entered and Authorized by:   Michele Mcalpine MD   Signed by:   Michele Mcalpine MD on 05/23/2010   Method used:   Print then Give to Patient   RxID:   [id number] FENOFIBRATE 160 MG TABS (FENOFIBRATE) Take 1 tablet by mouth once a day  #90 x 4   Entered and Authorized by:   Michele Mcalpine MD   Signed by:   Michele Mcalpine MD on 05/23/2010   Method used:   Print then Give to Patient   RxID:   [id number] PRAVACHOL 80 MG TABS (PRAVASTATIN SODIUM) take one tablet by mouth at bedtime  #90 x 4   Entered and Authorized by:   Lonzo Cloud  Kriste Basque MD   Signed by:   Michele Mcalpine MD on 05/23/2010   Method used:   Print then Give to Patient   RxID:   [id number] TOPROL XL 50 MG XR24H-TAB (METOPROLOL SUCCINATE) take 1 by mouth once daily  #90 x 4   Entered and Authorized by:   Michele Mcalpine MD   Signed by:   Michele Mcalpine MD on 05/23/2010   Method used:   Print then Give to Patient   RxID:   [id number]    Immunization History:  Influenza Immunization History:    Influenza:  declined (05/23/2010)  Pneumovax Immunization History:    Pneumovax:  declined (05/23/2010)

## 2010-06-05 NOTE — Miscellaneous (Signed)
Summary: Orders Update   Clinical Lists Changes  Orders: Added new Referral order of Radiology Referral (Radiology) - Signed 

## 2010-06-11 ENCOUNTER — Encounter (HOSPITAL_COMMUNITY): Payer: Self-pay

## 2010-06-11 ENCOUNTER — Encounter (HOSPITAL_COMMUNITY)
Admission: RE | Admit: 2010-06-11 | Discharge: 2010-06-11 | Disposition: A | Discharge status: Home / Self Care | Payer: Private Health Insurance - Indemnity | Source: Ambulatory Visit | Attending: Pulmonary Disease | Admitting: Pulmonary Disease

## 2010-06-11 DIAGNOSIS — R911 Solitary pulmonary nodule: Secondary | ICD-10-CM

## 2010-06-11 DIAGNOSIS — J984 Other disorders of lung: Secondary | ICD-10-CM | POA: Insufficient documentation

## 2010-06-11 MED ORDER — FLUDEOXYGLUCOSE F - 18 (FDG) INJECTION
17.3000 | Freq: Once | INTRAVENOUS | Status: AC | PRN
  Administered 2010-06-11: 17.3 via INTRAVENOUS

## 2010-06-13 ENCOUNTER — Telehealth: Payer: Self-pay | Admitting: Pulmonary Disease

## 2010-06-13 DIAGNOSIS — R7989 Other specified abnormal findings of blood chemistry: Secondary | ICD-10-CM | POA: Insufficient documentation

## 2010-06-13 DIAGNOSIS — E785 Hyperlipidemia, unspecified: Secondary | ICD-10-CM

## 2010-06-13 DIAGNOSIS — R7309 Other abnormal glucose: Secondary | ICD-10-CM

## 2010-06-13 DIAGNOSIS — K760 Fatty (change of) liver, not elsewhere classified: Secondary | ICD-10-CM

## 2010-06-13 NOTE — Telephone Encounter (Signed)
Dr. Kriste Basque called and spoke with Carlos Montgomery about his xray results---Carlos Montgomery is to return to the lab on 06/26/2010 for repeat labs and keep his appt in June with cxr.  Carlos Montgomery is aware

## 2010-06-14 NOTE — Progress Notes (Signed)
Summary: speak to nurse  Phone Note Call from Patient Call back at Work Phone [phone]   Caller: Patient Call For: Deena Shaub Reason for Call: Talk to Nurse Summary of Call: Wants to speak to nurse in ref to his ct. Initial call taken by: Darletta Moll,  June 04, 2010 12:27 PM  Follow-up for Phone Call        SN has called and spoke with pt about his ct scan results and order has been placed for pt to have PET scan Randell Loop CMA  June 04, 2010 2:50 PM

## 2010-07-06 ENCOUNTER — Other Ambulatory Visit (INDEPENDENT_AMBULATORY_CARE_PROVIDER_SITE_OTHER): Payer: Private Health Insurance - Indemnity

## 2010-07-06 ENCOUNTER — Other Ambulatory Visit (INDEPENDENT_AMBULATORY_CARE_PROVIDER_SITE_OTHER): Payer: Private Health Insurance - Indemnity | Admitting: Pulmonary Disease

## 2010-07-06 DIAGNOSIS — K7689 Other specified diseases of liver: Secondary | ICD-10-CM

## 2010-07-06 DIAGNOSIS — E785 Hyperlipidemia, unspecified: Secondary | ICD-10-CM

## 2010-07-06 DIAGNOSIS — R7309 Other abnormal glucose: Secondary | ICD-10-CM

## 2010-07-06 DIAGNOSIS — K760 Fatty (change of) liver, not elsewhere classified: Secondary | ICD-10-CM

## 2010-07-06 DIAGNOSIS — R7989 Other specified abnormal findings of blood chemistry: Secondary | ICD-10-CM

## 2010-07-06 LAB — HEPATIC FUNCTION PANEL
ALT: 79 U/L — ABNORMAL HIGH (ref 0–53)
AST: 64 U/L — ABNORMAL HIGH (ref 0–37)
Alkaline Phosphatase: 35 U/L — ABNORMAL LOW (ref 39–117)
Bilirubin, Direct: 0.2 mg/dL (ref 0.0–0.3)
Total Bilirubin: 1.2 mg/dL (ref 0.3–1.2)
Total Protein: 6.7 g/dL (ref 6.0–8.3)

## 2010-07-06 LAB — BASIC METABOLIC PANEL
CO2: 28 mEq/L (ref 19–32)
Chloride: 101 mEq/L (ref 96–112)
Glucose, Bld: 111 mg/dL — ABNORMAL HIGH (ref 70–99)
Potassium: 5.2 mEq/L — ABNORMAL HIGH (ref 3.5–5.1)
Sodium: 140 mEq/L (ref 135–145)

## 2010-07-06 LAB — LIPID PANEL: Total CHOL/HDL Ratio: 7

## 2010-07-09 ENCOUNTER — Telehealth: Payer: Self-pay | Admitting: *Deleted

## 2010-07-09 DIAGNOSIS — E785 Hyperlipidemia, unspecified: Secondary | ICD-10-CM

## 2010-07-09 MED ORDER — METFORMIN HCL 500 MG PO TABS: 500.0000 mg | ORAL_TABLET | Freq: Two times a day (BID) | ORAL | Status: DC

## 2010-07-10 NOTE — Telephone Encounter (Signed)
Called and lmomtcb for pt to make him aware per SN to start on the metformin that has already been sent to the pharmacy and that i put in an order for referral to the lipid clinic.

## 2010-07-11 NOTE — Telephone Encounter (Signed)
Spoke with rhonda and she stated that libby made the appt for the lipid clinic and the pt is aware of appt on 4-26 and pt may reschedule appt if not able to keep this appt.

## 2010-07-12 ENCOUNTER — Ambulatory Visit: Payer: Private Health Insurance - Indemnity

## 2010-07-19 ENCOUNTER — Ambulatory Visit (INDEPENDENT_AMBULATORY_CARE_PROVIDER_SITE_OTHER): Payer: Private Health Insurance - Indemnity

## 2010-07-19 VITALS — Wt 176.8 lb

## 2010-07-19 DIAGNOSIS — E785 Hyperlipidemia, unspecified: Secondary | ICD-10-CM

## 2010-07-19 MED ORDER — PRAVASTATIN SODIUM 80 MG PO TABS: 80.0000 mg | ORAL_TABLET | Freq: Every evening | ORAL | Status: DC

## 2010-07-19 NOTE — Patient Instructions (Signed)
1.) Stop taking fenofibrate. 2.) Start taking pravachol 80 mg daily at bedtime. 3.) Continue healthy diet eating fresh fruits and vegetables and lean meats. 4.) Continue exercise plan of briskly walking 30 minutes every day at lunch. 5.) Keep up the good work!

## 2010-07-19 NOTE — Assessment & Plan Note (Signed)
Hyperlipidemia uncontrolled and deteriorated. Goal LDL<70. Current lipid panel: TC 245, TG 348, HDL 32.7, LDL 175.5. Liver enzymes elevated off of pravastatin: AST 64, ALT 79. Discontinue fenofibrate since liver enzymes consistently elevated since starting. Restart pravastatin 80 mg qhs, likely he will need a more potent statin to get LDL to goal, but will restart pravastatin at this time due to intolerances. Potentially Zetia will be an option. Continue fish oil and cinnamon. TGs elevated and may increase off of fenofibrate. Improved BG control with metformin or additional medications may improve TGs. Encouraged patient to continue to adhere current diet and to continue walking daily. Recheck lipids and LFTs in 6 weeks. Return to clinic in 6 weeks.

## 2010-07-19 NOTE — Progress Notes (Signed)
Pleasant 45 y.o. M presents for initial Lipid Visit. Pt referred for elevated lipid panel and liver enzymes. He had been on pravachol previously with fenofibrate added about 1 year ago. Since that time his liver enzymes have increased. Pravastatin was discontinued 1 month ago; however, on follow-up lipid enzymes remained elevated. Pt is currently on fenofibrate, fish oil 3 caps daily, and cinnamon 3 caps daily. He has a history of intolerance to rosuvastain and simvastain due to muscle related issues. Today he has no complaints of shortness of breath, chest pain, or muscle pains or weakness.  Pt reports adherence to medication, as well as diet and exercise regimen. 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 Dietary review reveals: no fried foods, pork, and beverages of only water and unsweetened tea Breakfast: oatmeal, cereal, smoothies with fresh fruit and non-fat yogurt Lunch: fish (2x/week), chicken, [place] and vegetables Dinner: chicken, [place], or steamed veggies (red meat 1x/wk)  Of note, pt most recent A1C of 8.4%. He reports being told he was "pre-diabetic" and was recently started on metformin.  Current Outpatient Prescriptions  Medication Sig Dispense Refill  . ALPRAZolam (XANAX) 0.5 MG tablet Take 0.5-1 tablets by mouth Three times daily as needed.      . Cinnamon 500 MG TABS Take 3 tablets by mouth daily.        . fenofibrate 160 MG tablet Take 160 mg by mouth Daily.      . fish oil-omega-3 fatty acids 1000 MG capsule Take 3 g by mouth daily.        . metFORMIN (GLUCOPHAGE) 500 MG tablet Take 1 tablet (500 mg total) by mouth 2 (two) times daily with a meal.  60 tablet  11  . metoprolol (TOPROL-XL) 50 MG 24 hr tablet Take 50 mg by mouth Daily.      . pantoprazole (PROTONIX) 40 MG tablet Take 40 mg by mouth Daily.

## 2010-08-03 NOTE — Assessment & Plan Note (Signed)
[facility]                         GASTROENTEROLOGY OFFICE NOTE   NAME:Carlos Montgomery, Carlos Montgomery                      MRN:          [id number]  DATE:05/07/2006                            DOB:          [birth year]    PROBLEM:  Abdominal pain with diarrhea.   Mr. Carlos Montgomery has returned for reevaluation. He continues to complain of  subxiphoid pain. The pain is relieved if he presses his sternum. He  suffers from pyrosis which is well controlled with PPIs. When he has  discontinued them his pyrosis has returned. He is also complaining of  crampy lower abdominal pain associated with diarrhea. He has 3-4 water  stools a day accompanied by urgency and occasional incontinence. There  is no history of melena or hematochezia. He does not awaken at night to  defecate. The diarrhea has not been affected by his medications.   PHYSICAL EXAMINATION:  VITAL SIGNS:  Pulse 88, blood pressure 130/80,  weight 166.  ABDOMEN:  Without masses, tenderness or organomegaly.   IMPRESSION:  1. Upper epigastric pain. This may be abdominal wall pain or pain from      his chest skeleton.  2. Gastroesophageal reflux disease.  3. Diarrhea. This most likely is irritable bowel syndrome thought      inflammatory bowel disease ought to be ruled out.   RECOMMENDATIONS:  1. Trial of Levbid 0.375 mg twice a day.  2. Colonoscopy. I will take random biopsies if the exam is normal.  3. Consider small bowel enterography depending on #2.  4. Continue Protonix.     Barbette Hair. Arlyce Dice, MD,FACG  Electronically Signed    RDK/MedQ  DD: 05/07/2006  DT: 05/07/2006  Job #: [id number]

## 2010-08-03 NOTE — Assessment & Plan Note (Signed)
[facility]                           GASTROENTEROLOGY OFFICE NOTE   NAME:Carlos Montgomery, Carlos Montgomery                      MRN:          [id number]  DATE:11/07/2005                            DOB:          [birth year]    PROBLEM:  Abdominal pain.   In brief, Carlos Montgomery is a 45 year old, white male referred through the  courtesy of Dr. Kriste Basque for evaluation.  For several months, he has been  complaining of upper abdominal pressure-like discomfort.  It is slightly  relieved with eating.  It is also relieved with passing flatus or  eructation.  He has been on Nexium for years for reflux.  He denies nausea,  vomiting, change in bowel habits, melena or hematochezia.  He is on no  gastric irritants including nonsteroidals.   PAST MEDICAL HISTORY:  Pertinent for hypertension.   FAMILY HISTORY:  Noncontributory.   MEDICATIONS:  Toprol XL, Pravastatin and Nexium.   ALLERGIES:  CODEINE AND PENICILLIN.   SOCIAL HISTORY:  He does not smoke.  He drinks on weekends only, but not to  excess.  He is married and works as a Psychologist, occupational.   REVIEW OF SYSTEMS:  Positive for fatigue.   PHYSICAL EXAMINATION:  VITAL SIGNS:  Pulse 76, blood pressure 118/88, weight  163.  HEENT:  EOMI. PERRLA. Sclerae are anicteric.  Conjunctivae are pink.  NECK:  Supple without thyromegaly, adenopathy or carotid bruits.  CHEST:  Clear to auscultation and percussion without adventitious sounds.  CARDIAC:  Regular rhythm; normal S1 S2.  There are no murmurs, gallops or  rubs.  ABDOMEN:  Bowel sounds are normoactive.  Abdomen is soft, non-tender and non-  distended.  There are no abdominal masses, tenderness, splenic enlargement  or hepatomegaly.  EXTREMITIES:  Full range of motion.  No cyanosis, clubbing or edema.  RECTAL:  Deferred.   IMPRESSION:  Nonspecific upper abdominal discomfort.  This could be due to  nonulcer dyspepsia, perhaps Helicobacter pylori infection.  Finally,  Nexium  can also cause abdominal discomfort.   RECOMMENDATION:  1. Upper endoscopy.  2. If no abnormalities are seen, I will hold his Nexium.                                   Barbette Hair. Arlyce Dice, MD, [facility]   RDK/MedQ  DD:  11/07/2005  DT:  11/07/2005  Job #:  [id number]   cc:   Lonzo Cloud. Kriste Basque, MD

## 2010-08-09 ENCOUNTER — Other Ambulatory Visit: Payer: Self-pay | Admitting: Pulmonary Disease

## 2010-09-03 ENCOUNTER — Other Ambulatory Visit: Payer: Self-pay | Admitting: Pulmonary Disease

## 2010-09-03 ENCOUNTER — Other Ambulatory Visit (INDEPENDENT_AMBULATORY_CARE_PROVIDER_SITE_OTHER): Payer: Managed Care, Other (non HMO) | Admitting: *Deleted

## 2010-09-03 ENCOUNTER — Ambulatory Visit: Payer: Managed Care, Other (non HMO)

## 2010-09-03 DIAGNOSIS — E785 Hyperlipidemia, unspecified: Secondary | ICD-10-CM

## 2010-09-03 LAB — HEPATIC FUNCTION PANEL
Albumin: 4.8 g/dL (ref 3.5–5.2)
Alkaline Phosphatase: 58 U/L (ref 39–117)
Bilirubin, Direct: 0.2 mg/dL (ref 0.0–0.3)
Total Bilirubin: 0.9 mg/dL (ref 0.3–1.2)

## 2010-09-03 LAB — LIPID PANEL
Cholesterol: 211 mg/dL — ABNORMAL HIGH (ref 0–200)
Total CHOL/HDL Ratio: 5
VLDL: 63.2 mg/dL — ABNORMAL HIGH (ref 0.0–40.0)

## 2010-09-04 ENCOUNTER — Telehealth: Payer: Self-pay | Admitting: Pulmonary Disease

## 2010-09-04 DIAGNOSIS — K760 Fatty (change of) liver, not elsewhere classified: Secondary | ICD-10-CM

## 2010-09-04 DIAGNOSIS — I10 Essential (primary) hypertension: Secondary | ICD-10-CM

## 2010-09-04 DIAGNOSIS — R7309 Other abnormal glucose: Secondary | ICD-10-CM

## 2010-09-04 NOTE — Telephone Encounter (Signed)
lmomtcb for pt 

## 2010-09-04 NOTE — Telephone Encounter (Signed)
Called and spoke with pt and he stated that he can come to the appt on Thursday afternoon at 3:30.  He will come in Thursday morning for blood work and cxr and orders are in the computer.

## 2010-09-05 ENCOUNTER — Other Ambulatory Visit: Payer: Private Health Insurance - Indemnity | Admitting: *Deleted

## 2010-09-06 ENCOUNTER — Other Ambulatory Visit (INDEPENDENT_AMBULATORY_CARE_PROVIDER_SITE_OTHER): Payer: Managed Care, Other (non HMO)

## 2010-09-06 ENCOUNTER — Ambulatory Visit: Payer: Private Health Insurance - Indemnity

## 2010-09-06 ENCOUNTER — Encounter: Payer: Self-pay | Admitting: Pulmonary Disease

## 2010-09-06 ENCOUNTER — Ambulatory Visit (INDEPENDENT_AMBULATORY_CARE_PROVIDER_SITE_OTHER): Payer: Managed Care, Other (non HMO) | Admitting: Pulmonary Disease

## 2010-09-06 ENCOUNTER — Ambulatory Visit (INDEPENDENT_AMBULATORY_CARE_PROVIDER_SITE_OTHER)
Admission: RE | Admit: 2010-09-06 | Discharge: 2010-09-06 | Disposition: A | Discharge status: Home / Self Care | Payer: Managed Care, Other (non HMO) | Source: Ambulatory Visit | Attending: Pulmonary Disease | Admitting: Pulmonary Disease

## 2010-09-06 DIAGNOSIS — J984 Other disorders of lung: Secondary | ICD-10-CM

## 2010-09-06 DIAGNOSIS — R7309 Other abnormal glucose: Secondary | ICD-10-CM

## 2010-09-06 DIAGNOSIS — K760 Fatty (change of) liver, not elsewhere classified: Secondary | ICD-10-CM

## 2010-09-06 DIAGNOSIS — K219 Gastro-esophageal reflux disease without esophagitis: Secondary | ICD-10-CM

## 2010-09-06 DIAGNOSIS — E785 Hyperlipidemia, unspecified: Secondary | ICD-10-CM

## 2010-09-06 DIAGNOSIS — R911 Solitary pulmonary nodule: Secondary | ICD-10-CM

## 2010-09-06 DIAGNOSIS — E119 Type 2 diabetes mellitus without complications: Secondary | ICD-10-CM | POA: Insufficient documentation

## 2010-09-06 DIAGNOSIS — I10 Essential (primary) hypertension: Secondary | ICD-10-CM

## 2010-09-06 DIAGNOSIS — K7689 Other specified diseases of liver: Secondary | ICD-10-CM

## 2010-09-06 DIAGNOSIS — F411 Generalized anxiety disorder: Secondary | ICD-10-CM

## 2010-09-06 DIAGNOSIS — D126 Benign neoplasm of colon, unspecified: Secondary | ICD-10-CM

## 2010-09-06 HISTORY — DX: Solitary pulmonary nodule: R91.1

## 2010-09-06 LAB — BASIC METABOLIC PANEL
Chloride: 105 mEq/L (ref 96–112)
GFR: 142.5 mL/min (ref >60.00)
Glucose, Bld: 110 mg/dL — ABNORMAL HIGH (ref 70–99)
Potassium: 4.3 mEq/L (ref 3.5–5.1)
Sodium: 142 mEq/L (ref 135–145)

## 2010-09-06 MED ORDER — LOSARTAN POTASSIUM 100 MG PO TABS: 100.0000 mg | ORAL_TABLET | Freq: Every day | ORAL | Status: DC

## 2010-09-06 NOTE — Progress Notes (Signed)
Subjective:    Patient ID: Carlos Montgomery, male    DOB: [birth year], 45 y.o.   MRN: [id number]  HPI 45 y/o WM here for a follow up visit & review of multiple medical issues>  ~  May 23, 2010:  Carlos Montgomery reports an episode of blurry vision involving his right eye in 2011; he went to see his Optometrist Carlos Montgomery who saw a "bl clot behind my eye" & sent him to Carlos Montgomery at [facility] who dx a ?central retinal vein occlusion (we do not have records);  treated initially w/ series of 3 injections (no benefit), then Laser Rx (84 shots he says) & it has stabilized; pt says "it's from HBP" but his BP has been mild & easily treated w/ simple meds (on ToprolXL50 & BP last yr 120/82, this yr 132/84, & his hi reading at home was 138/90);  [name] does not have prev hx blood clots, no known hypercoag state, he is not obese, not a known diabetic (FBS=97 last yr), & an ex-smoker (quit 2000)- neg FamHx for blood clots or similar problems...  he does have a hx of a mixed hyperlipidemia on PRAV80 & FENOFIB160> while his Chol numbers have been good (TChol=167, LDL=87 last yr), his TG's have been elevated (TG 2/11 = 410) & he has struggled w/ low fat diet...    Here for CPX w/ CXR, EKG, Fasting blood work>> see below...  ~  September 06, 2010:  466mo ROV & review of what amounts to seemingly unrelated multisystem problems>>    Central Retinal Vein Occlusion> we don't have records from Optometrist or [facility] DrMathews; he reports no change in his vision (states right eye's left lower quadrant field is out, & depth perception is affected); he has f/u appt sched for 7/12...    Pulm Nodule> routine CXR 3/12 showed showed new nodular opac on left side;  CTChest 3/12 showed 14mm spiculated density LUL, neg mediastinal adenopathy, no other lesions identified (?tiny density in RML);  PETscan 3/12 showed 13mm LUL nodule w/ low metabolic activ, no hypermetabolic LNs, low metabolic activ in nodular density RML & ground glass area RLL (?infectious/  inflamm etiology);  We decided to wait & recheck 40mo= today & CXR no longer shows the LUL nodule, CXR is clear;  He remains asymptomatic> no CP, cough, sputum, hemoptysis, SOB, etc;  We discussed proceeding w/ 466mo f/u CT...    HBP> He notes BP up when stressed but usually 130s/ 80s at home;  Today BP=150/90 & we discussed adding Losartan 100mg  to his regimen; he will continue to monitor at home.    Diabetes> Newly diagnosed 3/12 CPX w/ FBS 139 & subseq A1c=8.4; he notes pos FamHx DM in one of his uncles (mother's brother); we discussed low carb diet & started Metform500 Bid; he has lost 5# down to 175# (BMI 24-25); Follow up labs today showed BS=110, A1c=7.4; rec to continue same med + diet & exercise...    Lipids> Long hx difficult lipid dilemma; Intol Crestor & Niaspan in past; Chol values intermittently improved on Prav 40-80mg  over the years & tol well but no sustained improvement, and TGs have been persistently elev despite Rx w/ Fenofibrate/ Antara; He was referred to [facility] w/ lifestyle mod education + herbal rx w/ cinnamon, milk thistle, & FishOil; See flow sheet below.    Abn LFT's> likely hepatic steatosis based on CTChest report 3/12 showing diffuse hep steatosis & norm adrenals below the diaph;  Can't rule out contibution from Statin medication (LFT's  didn't improve substantially off Prav80 4/12, but they did get worse back on Prev80 6/12)...    <<< For now rec adding Losartan100 to the ToprolXL50; Continue Metformin500Bid + diet/ exercise; Continue lifestyle mod but STOP the Prav80 & leave off Fenofib; Absolutely no Etoh or hepatotoxins & f/u LFTs> next step is further GI eval to confirm the dx & consider liver bx; Proceed w/ f/u CTChest to recheck nodule >>>   Current Problem List  RETINAL VEIN OCCLUSION, Right Eye (ICD-362.30) >> SEE ABOVE ~  6/12:  we don't have records from Optometrist or [facility] DrMathews; he reports no change in his vision (states right eye's left lower quadrant field is out,  & depth perception is affected); he has f/u appt sched for 7/12...  ASTHMATIC BRONCHITIS, ACUTE (ICD-466.0) - no recent exac and doing well- denies cough, sputum, hemoptysis, worsening dyspnea, wheezing, chest pains, snoring, daytime hypersomnolence, etc...  PULMONARY NODULE >> SEE ABOVE ~  3/12:  routine CXR showed showed new nodular opac on left side;   ~  CTChest 3/12 showed 14mm spiculated density LUL, neg mediastinal adenopathy, no other lesions identified (?tiny density in RML);   ~  PETscan 3/12 showed 13mm LUL nodule w/ low metabolic activ, no hypermetabolic LNs, low metabolic activ in nodular density RML & ground glass area RLL (?infectious/ inflamm etiology);  We decided to wait & recheck in 44mo... ~  6/12:  f/u CXR no longer shows the LUL nodule, CXR is clear;  He remains asymptomatic> no CP, cough, sputum, hemoptysis, SOB, etc;   ~  f/u CTChest 6/12 ==> pending  HYPERTENSION (ICD-401.9) - on ASA 81mg /d,  TOPROL XL 50mg /d... BP today= 150/90, home BPs are all 130s/ 80s he says... denies HA, fatigue, CP, palipit, dizziness, syncope, dyspnea, edema, etc... we discussed taking med daily & continue home monitoring... ~  6/12:  He notes BP up when stressed but usually 130s/ 80s at home; Today BP=150/90 & we discussed adding LOSARTAN 100mg  to his regimen; he will continue to monitor at home.  HYPERLIPIDEMIA (ICD-272.4) - on PRAVACHOL 80mg /d & FENOFIBRATE 160mg /d... Years ago he was followed in the Lipid Clinic & INTOL to Crestor & Niaspan... ~  FLP 8/08 ?on Prav40 & Antara130 showed TChol 206, TG 215, HDL 31, LDL 167 ~  FLP 8/09 ?on Prav40 showed TChol 293, TG 280, HDL 36, LDL 188... rec> incr Prav80. ~  FLP 2/11 on Prav80 showed TChol 167, TG 410, HDL 40, LDL 87... rec> add Fenofib160. ~  FLP 3/12 on Prav80+Feno160 showed TChol 211, TG 313, HDL 30, LDL 150... But LFTs abn w/ SGOT=85, SGPT=73; Rec to stop Prav80. ~  FLP 4/12 on Feno160 showed TChol 245, TG 348, HDL 33, LDL 176... LFTs showed  SGOT=64, SGPT=79; Sent to [facility] & they stopped Feno160, restarted Prav80. ~  FLP 6/12 on Prav80 showed TChol 211, TG 316, HDL 44, LDL 131... But LFTs worse SGOT=101, SGPT=179; Rec to stop all meds, use diet & exercise for now.  DIABETES MELLITUS >> on METFORMIN 500mg  Bid + diet rx... ~  3/12:  Newly diagnosed 3/12 CPX w/ FBS 139 & subseq A1c=8.4; he notes pos FamHx DM in one of his uncles (mother's brother); Rec low carb diet & start Metform500 Bid;  ~  6/12:  he has lost 5# down to 175# (BMI 24-25); Follow up labs today showed BS=110, A1c=7.4; rec to continue same med + diet & exercise...  GERD (ICD-530.81) - on PROTONIX 40mg /d... ~  last EGD 9/07 by Dr  Arlyce Dice was WNL...  COLONIC POLYPS (ICD-211.3) - colonoscopy 8/08 by Dorris Singh showed several 5mm polyps, otherw neg... bx= adenomatous w/ f/u planned 68yrs.  ABNORMAL LFTs/ HEPATIC STEATOSIS >>  likely hepatic steatosis based on CTChest report 3/12 showing diffuse hep steatosis & norm adrenals below the diaph;  Can't rule out contibution from Statin medication (LFT's didn't improve substantially off Prav80 4/12, but they did get worse back on Prev80 6/12)...  PEYRONIE'S DISEASE (ICD-607.89) - he saw DrDahlstedt in 1998 for poss early Peyronies...  Hx of NECK PAIN (ICD-723.1) - eval by DrWeymann in 2005 for paresthesias... ?MRI's done- we never received f/u note.  ANXIETY (ICD-300.00) - on ALPRAZOLAM 0.5mg  Prn...  Health Maintenance:   ~  GI:  had colon 8/08 by Dorris Singh w/ 5mm polyp, f/u planned 40yrs... ~  GU:  prostate exam= norm, 2+ no nodules, stool heme neg... ~  Immunizations:  ?last tetanus shot, he refuses Flu vaccines...   No past surgical history on file.   Outpatient Encounter Prescriptions as of 09/06/2010  Medication Sig Dispense Refill  . ALPRAZolam (XANAX) 0.5 MG tablet Take 0.5-1 tablets by mouth Three times daily as needed.      . Cinnamon 500 MG TABS Take 3 tablets by mouth daily.        . fish oil-omega-3 fatty acids 1000  MG capsule Take 3 g by mouth daily.        . metFORMIN (GLUCOPHAGE) 500 MG tablet Take 1 tablet (500 mg total) by mouth 2 (two) times daily with a meal.  60 tablet  11  . metoprolol (TOPROL-XL) 50 MG 24 hr tablet Take 50 mg by mouth Daily.      . pantoprazole (PROTONIX) 40 MG tablet Take 40 mg by mouth Daily.      Marland Kitchen DISCONTD: pravastatin (PRAVACHOL) 80 MG tablet Take 1 tablet (80 mg total) by mouth every evening.  ==> stopped today due to ABN LFTs...    . losartan (COZAAR) 100 MG tablet Take 1 tablet (100 mg total) by mouth daily.  30 tablet  11  . DISCONTD: fenofibrate 160 MG tablet TAKE 1 TABLET BY MOUTH ONCE A DAY  ==> stopped by [facility] as INEFFECTIVE...      Allergies  Allergen Reactions  . Codeine   . Niacin     REACTION: pt states INTOL to Niaspan  . Penicillins   . Rosuvastatin     REACTION: pt states INTOL to Crestor    Review of Systems        The patient complains of blurring, vision loss, and anxiety.  The patient denies fever, chills, sweats, anorexia, fatigue, weakness, malaise, weight loss, sleep disorder, diplopia, eye irritation, eye discharge, eye pain, photophobia, earache, ear discharge, tinnitus, decreased hearing, nasal congestion, nosebleeds, sore throat, hoarseness, chest pain, palpitations, syncope, dyspnea on exertion, orthopnea, PND, peripheral edema, cough, dyspnea at rest, excessive sputum, hemoptysis, wheezing, pleurisy, nausea, vomiting, diarrhea, constipation, change in bowel habits, abdominal pain, melena, hematochezia, jaundice, gas/bloating, indigestion/heartburn, dysphagia, odynophagia, dysuria, hematuria, urinary frequency, urinary hesitancy, nocturia, incontinence, back pain, joint pain, joint swelling, muscle cramps, muscle weakness, stiffness, arthritis, sciatica, restless legs, leg pain at night, leg pain with exertion, rash, itching, dryness, suspicious lesions, paralysis, paresthesias, seizures, tremors, vertigo, transient blindness, frequent falls, frequent  headaches, difficulty walking, depression, memory loss, confusion, cold intolerance, heat intolerance, polydipsia, polyphagia, polyuria, unusual weight change, abnormal bruising, bleeding, enlarged lymph nodes, urticaria, allergic rash, hay fever, and recurrent infections.     Objective:   Physical Exam  WD, WN, 45 y/o WM in NAD... GENERAL:  Alert & oriented; pleasant & cooperative... HEENT:  [place]/AT, EOM-wnl, Hx retinal vein occlusion rt eye, EACs-clear, TMs-wnl, NOSE-clear, THROAT-clear & wnl. NECK:  Supple w/ full ROM; no JVD; normal carotid impulses w/o bruits; no thyromegaly or nodules palpated; no lymphadenopathy. CHEST:  Clear to P & A; without wheezes/ rales/ or rhonchi. HEART:  Regular Rhythm; without murmurs/ rubs/ or gallops. ABDOMEN:  Soft & nontender; normal bowel sounds; no organomegaly or masses detected. (RECTAL:  Neg - prostate 2+ & nontender w/o nodules; stool hematest neg) EXT: without deformities or arthritic changes; no varicose veins/ venous insuffic/ or edema. NEURO:  CN's intact; motor testing normal; sensory testing normal; gait normal & balance OK. DERM:  No lesions noted; no rash etc...   Assessment & Plan:   RETINAL VEIN OCCLUSION>  we don't have records from Optometrist or [facility] DrMathews; he reports no change in his vision (states right eye's left lower quadrant field is out, & depth perception is affected); he has f/u appt sched for 7/12...  PULM NODULE>  CXR no longer shows the LUL nodule, CXR is clear;  He remains asymptomatic> no CP, cough, sputum, hemoptysis, SOB, etc;  We discussed proceeding w/ 60mo f/u CT ==> pending...  HBP>  He notes BP up when stressed but usually 130s/ 80s at home;  Today BP=150/90 & we discussed adding Losartan 100mg  to his regimen; he will continue to monitor at home.  LIPIDS>  Long hx difficult lipid dilemma;  We decided to stop both meds & observe for now on Diet/ Exercise alone;  We will re-address this prob when other issues  are resolved & LFTs back to baseline...  DM>  Follow up labs today showed BS=110, A1c=7.4; rec to continue same med + diet & exercise...  LFTs>  likely hepatic steatosis based on CTChest report 3/12 showing diffuse hep steatosis & norm adrenals below the diaph;  Can't rule out contibution from Statin medication (LFT's didn't improve substantially off Prav80 4/12, but they did get worse back on Prev80 6/12)... We decided to stop all lipid lowering meds for now see if these numbers improve w/ diet/ exercise/ off meds; if not then we will refer to GI for formal Liver eval & poss bx...  Other medical problems as noted.Marland KitchenMarland Kitchen

## 2010-09-06 NOTE — Patient Instructions (Signed)
Today we updated your meds in EPIC>    We decided to STOP the Pravastatin & Fenofibrate for now; try to improve your Lipids via diet & exercise; you need a good low fat diet & try to lose another 5# or more; we will recheck your fasting blood work & liver enzymes on return...    We decided to add LOSARTAN 100mg /d for your BP & to help protect your kidneys from damage due to the diabetes; continue to monitor your BP at home...  We will arrange for a follow up CT scan of your chest so that we can compare it directly w/ the 3/12 scan to see if there has been any change in the nodule... We will call you w/ this report when available...  Let's plan another follow up visit in 3 months.Marland KitchenMarland Kitchen

## 2010-09-07 ENCOUNTER — Other Ambulatory Visit: Payer: Self-pay | Admitting: Pulmonary Disease

## 2010-09-07 DIAGNOSIS — R911 Solitary pulmonary nodule: Secondary | ICD-10-CM

## 2010-09-17 ENCOUNTER — Ambulatory Visit (INDEPENDENT_AMBULATORY_CARE_PROVIDER_SITE_OTHER)
Admission: RE | Admit: 2010-09-17 | Discharge: 2010-09-17 | Disposition: A | Discharge status: Home / Self Care | Payer: Managed Care, Other (non HMO) | Source: Ambulatory Visit | Attending: Pulmonary Disease | Admitting: Pulmonary Disease

## 2010-09-17 DIAGNOSIS — R911 Solitary pulmonary nodule: Secondary | ICD-10-CM

## 2010-09-17 DIAGNOSIS — J984 Other disorders of lung: Secondary | ICD-10-CM

## 2010-09-17 MED ORDER — IOHEXOL 300 MG/ML  SOLN
80.0000 mL | Freq: Once | INTRAMUSCULAR | Status: AC | PRN
  Administered 2010-09-17: 80 mL via INTRAVENOUS

## 2010-10-17 ENCOUNTER — Encounter (INDEPENDENT_AMBULATORY_CARE_PROVIDER_SITE_OTHER): Payer: Managed Care, Other (non HMO) | Admitting: Ophthalmology

## 2010-12-05 ENCOUNTER — Ambulatory Visit: Payer: Managed Care, Other (non HMO) | Admitting: Pulmonary Disease

## 2010-12-28 ENCOUNTER — Telehealth: Payer: Self-pay | Admitting: Pulmonary Disease

## 2010-12-28 MED ORDER — ALPRAZOLAM 0.5 MG PO TABS: ORAL_TABLET | ORAL | Status: DC

## 2010-12-28 NOTE — Telephone Encounter (Signed)
TP, please advise if okay to refill alprazolam for this pt. He was last seen 09/06/10 and has appt with SN pending for 01/04/11. Thanks!

## 2010-12-28 NOTE — Telephone Encounter (Signed)
Yes that is fine for 1 refill only

## 2010-12-28 NOTE — Telephone Encounter (Signed)
Rx was called to pharm with no refills

## 2011-01-04 ENCOUNTER — Ambulatory Visit: Payer: Managed Care, Other (non HMO) | Admitting: Pulmonary Disease

## 2011-02-01 ENCOUNTER — Other Ambulatory Visit (INDEPENDENT_AMBULATORY_CARE_PROVIDER_SITE_OTHER): Payer: Managed Care, Other (non HMO)

## 2011-02-01 ENCOUNTER — Other Ambulatory Visit: Payer: Self-pay | Admitting: Pulmonary Disease

## 2011-02-01 DIAGNOSIS — E119 Type 2 diabetes mellitus without complications: Secondary | ICD-10-CM

## 2011-02-01 LAB — BASIC METABOLIC PANEL
Calcium: 9.4 mg/dL (ref 8.4–10.5)
GFR: 86.52 mL/min (ref >60.00)
Glucose, Bld: 80 mg/dL (ref 70–99)
Potassium: 4.5 mEq/L (ref 3.5–5.1)
Sodium: 140 mEq/L (ref 135–145)

## 2011-02-05 ENCOUNTER — Ambulatory Visit (INDEPENDENT_AMBULATORY_CARE_PROVIDER_SITE_OTHER): Payer: Managed Care, Other (non HMO) | Admitting: Pulmonary Disease

## 2011-02-05 ENCOUNTER — Encounter: Payer: Self-pay | Admitting: Pulmonary Disease

## 2011-02-05 DIAGNOSIS — E785 Hyperlipidemia, unspecified: Secondary | ICD-10-CM

## 2011-02-05 DIAGNOSIS — R7989 Other specified abnormal findings of blood chemistry: Secondary | ICD-10-CM

## 2011-02-05 DIAGNOSIS — H349 Unspecified retinal vascular occlusion: Secondary | ICD-10-CM

## 2011-02-05 DIAGNOSIS — E119 Type 2 diabetes mellitus without complications: Secondary | ICD-10-CM

## 2011-02-05 DIAGNOSIS — I10 Essential (primary) hypertension: Secondary | ICD-10-CM

## 2011-02-05 LAB — LIPID PANEL
HDL: 33.2 mg/dL — ABNORMAL LOW (ref >39.00)
Total CHOL/HDL Ratio: 7
VLDL: 62.8 mg/dL — ABNORMAL HIGH (ref 0.0–40.0)

## 2011-02-05 LAB — HEPATIC FUNCTION PANEL
AST: 70 U/L — ABNORMAL HIGH (ref 0–37)
Alkaline Phosphatase: 27 U/L — ABNORMAL LOW (ref 39–117)
Bilirubin, Direct: 0.1 mg/dL (ref 0.0–0.3)
Total Bilirubin: 0.5 mg/dL (ref 0.3–1.2)

## 2011-02-05 NOTE — Patient Instructions (Signed)
Today we updated your med list in our EPIC system...    Continue your current medications the same...  We are running your follow up fasting blood work...    Please call the PHONE TREE in a few days for your results...    Dial [id number] & when prompted enter your patient number followed by the # symbol...    Your patient number is:  [id number]#  We will plan on rechecking everything including your CXR w/ your physical in 2013...  Call for any problems or questions.Marland KitchenMarland Kitchen

## 2011-02-06 LAB — LDL CHOLESTEROL, DIRECT: Direct LDL: 170.1 mg/dL

## 2011-02-12 ENCOUNTER — Encounter: Payer: Self-pay | Admitting: Pulmonary Disease

## 2011-02-12 NOTE — Progress Notes (Signed)
Subjective:    Patient ID: Carlos Montgomery, male    DOB: [birth year], 45 y.o.   MRN: [id number]  HPI 45 y/o WM here for a follow up visit & review of multiple medical issues>  ~  May 23, 2010:  Carlos Montgomery reports an episode of blurry vision involving his right eye in 2011; he went to see his Optometrist [name] who saw a "bl clot behind my eye" & sent him to [name] at [facility] who dx a ?central retinal vein occlusion (we do not have records);  treated initially w/ series of 3 injections (no benefit), then Laser Rx (84 shots he says) & it has stabilized; pt says "it's from HBP" but his BP has been mild & easily treated w/ simple meds (on ToprolXL50 & BP last yr 120/82, this yr 132/84, & his hi reading at home was 138/90);  Carlos Montgomery does not have prev hx blood clots, no known hypercoag state, he is not obese, not a known diabetic (FBS=97 last yr), & an ex-smoker (quit 2000)- neg FamHx for blood clots or similar problems...  he does have a hx of a mixed hyperlipidemia on PRAV80 & FENOFIB160> while his Chol numbers have been good (TChol=167, LDL=87 last yr), his TG's have been elevated (TG 2/11 = 410) & he has struggled w/ low fat diet...    Here for CPX w/ CXR, EKG, Fasting blood work>> see below...  ~  September 06, 2010:  77mo ROV & review of what amounts to seemingly unrelated multisystem problems>>    Central Retinal Vein Occlusion> we don't have records from Optometrist or [facility] DrMathews; he reports no change in his vision (states right eye's left lower quadrant field is out, & depth perception is affected); he has f/u appt sched for 7/12...    Pulm Nodule> routine CXR 3/12 showed showed new nodular opac on left side;  CTChest 3/12 showed 14mm spiculated density LUL, neg mediastinal adenopathy, no other lesions identified (?tiny density in RML);  PETscan 3/12 showed 13mm LUL nodule w/ low metabolic activ, no hypermetabolic LNs, low metabolic activ in nodular density RML & ground glass area RLL (?infectious/  inflamm etiology);  We decided to wait & recheck 21mo= today & CXR no longer shows the LUL nodule, CXR is clear;  He remains asymptomatic> no CP, cough, sputum, hemoptysis, SOB, etc;  We discussed proceeding w/ 77mo f/u CT...    HBP> He notes BP up when stressed but usually 130s/ 80s at home;  Today BP=150/90 & we discussed adding Losartan 100mg  to his regimen; he will continue to monitor at home.    Diabetes> Newly diagnosed 3/12 CPX w/ FBS 139 & subseq A1c=8.4; he notes pos FamHx DM in one of his uncles (mother's brother); we discussed low carb diet & started Metform500 Bid; he has lost 5# down to 175# (BMI 24-25); Follow up labs today showed BS=110, A1c=7.4; rec to continue same med + diet & exercise...    Lipids> Long hx difficult lipid dilemma; Intol Crestor & Niaspan in past; Chol values intermittently improved on Prav 40-80mg  over the years & tol well but no sustained improvement, and TGs have been persistently elev despite Rx w/ Fenofibrate/ Antara; He was referred to [facility] w/ lifestyle mod education + herbal rx w/ cinnamon, milk thistle, & FishOil; See flow sheet below.    Abn LFT's> likely hepatic steatosis based on CTChest report 3/12 showing diffuse hep steatosis & norm adrenals below the diaph;  Can't rule out contibution from Statin medication (LFT's  didn't improve substantially off Prav80 4/12, but they did get worse back on Prev80 6/12)...    <<< For now rec adding Losartan100 to the ToprolXL50; Continue Metformin500Bid + diet/ exercise; Continue lifestyle mod but STOP the Prav80 & leave off Fenofib; Absolutely no Etoh or hepatotoxins & f/u LFTs> next step is further GI eval to confirm the dx & consider liver bx; Proceed w/ f/u CTChest to recheck nodule==> LUL nodule resolved, no other nodules seen, fatty change in liver...  ~  February 05, 2011:  624mo ROV & Carlos Montgomery reports doing very well w/o new complaints or concerns; denies CP, palpit, SOB, cough/phlegm, etc; BP controlled on meds, BS is normal on  Metformin, FLP however is worse off statins and LFTs remain elev although improved from 624mo ago...  We discussed diet again & will try Fibrate monotherapy for now (ANTARA 130mg /d), f/u labs, then consider very low dose statin after that;  We reviewed hepatic steatosis & need for very low fat diet> he will also need GI liver eval for completeness...          Problem List:   RETINAL VEIN OCCLUSION, Right Eye (ICD-362.30) >> SEE ABOVE ~  6/12:  we don't have records from Optometrist or [facility] DrMathews; he reports no change in his vision (states right eye's left lower quadrant field is out, & depth perception is affected)... ~  11/12:  He continues to f/u w/ [facility] & reports no change in his vision (he estimates he can see about 1/2 out of the right eye)...  ASTHMATIC BRONCHITIS, ACUTE (ICD-466.0) - no recent exac and doing well- denies cough, sputum, hemoptysis, worsening dyspnea, wheezing, chest pains, snoring, daytime hypersomnolence, etc...  PULMONARY NODULE >> SEE ABOVE ~  3/12:  routine CXR showed showed new nodular opac on left side;   ~  CTChest 3/12 showed 14mm spiculated density LUL, neg mediastinal adenopathy, no other lesions identified (?tiny density in RML);   ~  PETscan 3/12 showed 13mm LUL nodule w/ low metabolic activ, no hypermetabolic LNs, low metabolic activ in nodular density RML & ground glass area RLL (?infectious/ inflamm etiology);  We decided to wait & recheck in 24mo... ~  6/12:  f/u CXR no longer shows the LUL nodule, CXR is clear;  He remains asymptomatic> no CP, cough, sputum, hemoptysis, SOB, etc;   ~  f/u CTChest 6/12 ==> negative, nodule resolved, no lesions seen, fatty change noted in liver...  HYPERTENSION (ICD-401.9) - on ASA 81mg /d,  TOPROL XL 50mg /d, & LOSARTAN 100mg /d added 6/12... BP today= 122/82, home BPs are all 130s/ 80s he says... denies HA, fatigue, CP, palipit, dizziness, syncope, dyspnea, edema, etc... we discussed taking med daily & continue home  monitoring... ~  6/12:  He notes BP up when stressed but usually 130s/ 80s at home; Today BP=150/90 & we discussed adding LOSARTAN 100mg  to his regimen; he will continue to monitor at home. ~  11/12:  BP= 122/82 & feeling well, asymptomatic...  HYPERLIPIDEMIA (ICD-272.4) - prev on Pravachol 80mg /d & Fenofibrate 160mg /d... Years ago he was [facility] & INTOL to Crestor & Niaspan... ~  FLP 8/08 ?on Prav40 & Antara130 showed TChol 206, TG 215, HDL 31, LDL 167 ~  FLP 8/09 ?on Prav40 showed TChol 293, TG 280, HDL 36, LDL 188... rec> incr Prav80. ~  FLP 2/11 on Prav80 showed TChol 167, TG 410, HDL 40, LDL 87... rec> add Fenofib160. ~  FLP 3/12 on Prav80+Feno160 showed TChol 211, TG 313, HDL 30,  LDL 150... But LFTs abn w/ SGOT=85, SGPT=73; Rec to stop Prav80. ~  FLP 4/12 on Feno160 showed TChol 245, TG 348, HDL 33, LDL 176... LFTs showed SGOT=64, SGPT=79; Sent to [facility] & they stopped Feno160, restarted Prav80. ~  FLP 6/12 on Prav80 showed TChol 211, TG 316, HDL 44, LDL 131... But LFTs worse SGOT=101, SGPT=179; Rec to stop all meds, use diet & exercise for now. ~  FLP 11/12 on diet alone showed TChol 232, TG 314, HDL 33, LDL 170... LFT's improved by still abn; REC to try ANTARA 130mg /d monotherapy for now.  DIABETES MELLITUS >> on METFORMIN 500mg  Bid + diet rx... ~  3/12:  Newly diagnosed 3/12 CPX w/ FBS 139 & subseq A1c=8.4; he notes pos FamHx DM in one of his uncles (mother's brother); Rec low carb diet & start Metform500 Bid... ~  6/12:  he has lost 5# down to 175# (BMI 24-25); Follow up labs today showed BS=110, A1c=7.4; rec to continue same med + diet & exercise... ~  Labs 11/12 on Metform500Bid showed BS= 80, A1c not done...  GERD (ICD-530.81) - on PROTONIX 40mg /d... ~  last EGD 9/07 by Dr Arlyce Dice was WNL...  COLONIC POLYPS (ICD-211.3) - colonoscopy 8/08 by Dorris Singh showed several 5mm polyps, otherw neg... bx= adenomatous w/ f/u planned 82yrs.  ABNORMAL LFTs/ HEPATIC STEATOSIS >>   likely hepatic steatosis based on CTChest report 3/12 showing diffuse hep steatosis & norm adrenals below the diaph;  Can't rule out contibution from Statin medication (LFT's didn't improve substantially off Prav80 4/12, but they did get worse back on Prev80 6/12, & subseq improved off the statin 11/12)... He will need GI eval for completeness to r/o other potential caused of incr LFTs... ~  See lab cumulative summary sheets for the numbers...  PEYRONIE'S DISEASE (ICD-607.89) - he saw DrDahlstedt in 1998 for poss early Peyronies...  Hx of NECK PAIN (ICD-723.1) - eval by DrWeymann in 2005 for paresthesias... ?MRI's done- we never received f/u note.  ANXIETY (ICD-300.00) - on ALPRAZOLAM 0.5mg  Prn...  Health Maintenance:   ~  GI:  had colon 8/08 by Dorris Singh w/ 5mm polyp, f/u planned 60yrs... ~  GU:  prostate exam= norm, 2+ no nodules, stool heme neg... ~  Immunizations:  ?last tetanus shot, he refuses Flu vaccines...   No past surgical history on file.   Outpatient Encounter Prescriptions as of 02/05/2011  Medication Sig Dispense Refill  . ALPRAZolam (XANAX) 0.5 MG tablet 1/2 to 1 tablet three times daily as needed  90 tablet  0  . Cinnamon 500 MG TABS Take 3 tablets by mouth daily.        . fish oil-omega-3 fatty acids 1000 MG capsule Take 3 g by mouth daily.        Marland Kitchen losartan (COZAAR) 100 MG tablet Take 1 tablet (100 mg total) by mouth daily.  30 tablet  11  . metFORMIN (GLUCOPHAGE) 500 MG tablet Take 1 tablet (500 mg total) by mouth 2 (two) times daily with a meal.  60 tablet  11  . metoprolol (TOPROL-XL) 50 MG 24 hr tablet Take 50 mg by mouth Daily.      . pantoprazole (PROTONIX) 40 MG tablet Take 40 mg by mouth Daily.        Allergies  Allergen Reactions  . Codeine   . Niacin     REACTION: pt states INTOL to Niaspan  . Penicillins   . Rosuvastatin     REACTION: pt states INTOL to Crestor  Current Medications, Allergies, Past Medical History, Past Surgical History, Family  History, and Social History were reviewed in [employer] record.    Review of Systems        The patient complains of blurring, vision loss, and anxiety.  The patient denies fever, chills, sweats, anorexia, fatigue, weakness, malaise, weight loss, sleep disorder, diplopia, eye irritation, eye discharge, eye pain, photophobia, earache, ear discharge, tinnitus, decreased hearing, nasal congestion, nosebleeds, sore throat, hoarseness, chest pain, palpitations, syncope, dyspnea on exertion, orthopnea, PND, peripheral edema, cough, dyspnea at rest, excessive sputum, hemoptysis, wheezing, pleurisy, nausea, vomiting, diarrhea, constipation, change in bowel habits, abdominal pain, melena, hematochezia, jaundice, gas/bloating, indigestion/heartburn, dysphagia, odynophagia, dysuria, hematuria, urinary frequency, urinary hesitancy, nocturia, incontinence, back pain, joint pain, joint swelling, muscle cramps, muscle weakness, stiffness, arthritis, sciatica, restless legs, leg pain at night, leg pain with exertion, rash, itching, dryness, suspicious lesions, paralysis, paresthesias, seizures, tremors, vertigo, transient blindness, frequent falls, frequent headaches, difficulty walking, depression, memory loss, confusion, cold intolerance, heat intolerance, polydipsia, polyphagia, polyuria, unusual weight change, abnormal bruising, bleeding, enlarged lymph nodes, urticaria, allergic rash, hay fever, and recurrent infections.     Objective:   Physical Exam    WD, WN, 45 y/o WM in NAD... GENERAL:  Alert & oriented; pleasant & cooperative... HEENT:  [place]/AT, EOM-wnl, Hx retinal vein occlusion rt eye, EACs-clear, TMs-wnl, NOSE-clear, THROAT-clear & wnl. NECK:  Supple w/ full ROM; no JVD; normal carotid impulses w/o bruits; no thyromegaly or nodules palpated; no lymphadenopathy. CHEST:  Clear to P & A; without wheezes/ rales/ or rhonchi. HEART:  Regular Rhythm; without murmurs/ rubs/ or  gallops. ABDOMEN:  Soft & nontender; normal bowel sounds; no organomegaly or masses detected. (RECTAL:  Neg - prostate 2+ & nontender w/o nodules; stool hematest neg) EXT: without deformities or arthritic changes; no varicose veins/ venous insuffic/ or edema. NEURO:  CN's intact; motor testing normal; sensory testing normal; gait normal & balance OK. DERM:  No lesions noted; no rash etc...  RADIOLOGY DATA:  Reviewed in the EPIC EMR & discussed w/ the patient...  LABORATORY DATA:  Reviewed in the EPIC EMR & discussed w/ the patient...   Assessment & Plan:   RETINAL VEIN OCCLUSION>  we don't have records from Optometrist or [facility] DrMathews; he reports no change in his vision (states right eye's left lower quadrant field is out, & depth perception is affected); he continues to f/u w/ [facility]...  PULM NODULE>  CXR no longer shows the LUL nodule, CXR is clear & CT confirms resolution of nodule, no residual abn seen;  He remains asymptomatic> no CP, cough, sputum, hemoptysis, SOB, etc;  We will continue to follow his CXRs...  HBP>  He notes BP up when stressed but usually 130s/ 80s at home;  Today BP=122/82 on ToprolXL50 & Losartan100; he will continue to monitor at home.  LIPIDS>  Long hx difficult lipid dilemma;  We stopped both meds & on Diet/ Exercise alone his FLP is worse & LFTs are sl better;  We decided to try ANTARA monotherapy + his best diet efforts w/ f/u in 3-4 months...  DM>  Follow up labs today showed BS=80;  rec to continue same med + diet & exercise...  LFTs>  likely hepatic steatosis based on CTChest report 3/12 showing diffuse hep steatosis & norm adrenals below the diaph;  Can't rule out contibution from Statin medication (LFT's didn't improve substantially off Prav80 4/12, but they did get worse back on Prev80 6/12 & then improved some  off the statin 11/12)... We will need to refer to GI for formal Liver eval & poss bx...  Other medical problems as noted.Marland KitchenMarland Kitchen

## 2011-02-13 ENCOUNTER — Other Ambulatory Visit: Payer: Self-pay | Admitting: *Deleted

## 2011-02-13 MED ORDER — FENOFIBRATE MICRONIZED 130 MG PO CAPS: 130.0000 mg | ORAL_CAPSULE | Freq: Every day | ORAL | Status: DC

## 2011-02-18 ENCOUNTER — Telehealth: Payer: Self-pay | Admitting: Pulmonary Disease

## 2011-02-18 NOTE — Telephone Encounter (Signed)
Called and spoke with pt's wife.  Gave her a list of pt's recent updated med list per his last OV with SN on 02/05/11.  And informed her that per the lab results, SN wanted to try pt on Antara (fenofibrate) 130mg  d/t FLP and samples were left at front desk, along with rx sent to pharmacy.  Informed wife that both Lestine Mount is the brand name and fenofibrate is the generic and either of these are ok for pt to take as long as it was for 130mg .  Wife verbalized understanding and denied any questions and stated she would relay message to pt.

## 2011-05-27 ENCOUNTER — Telehealth: Payer: Self-pay | Admitting: Pulmonary Disease

## 2011-05-27 DIAGNOSIS — R7989 Other specified abnormal findings of blood chemistry: Secondary | ICD-10-CM

## 2011-05-27 DIAGNOSIS — Z0000 Encounter for general adult medical examination without abnormal findings: Secondary | ICD-10-CM

## 2011-05-27 DIAGNOSIS — E785 Hyperlipidemia, unspecified: Secondary | ICD-10-CM

## 2011-05-27 NOTE — Telephone Encounter (Signed)
LMTCB

## 2011-05-28 MED ORDER — METOPROLOL SUCCINATE ER 50 MG PO TB24: 50.0000 mg | ORAL_TABLET | Freq: Every day | ORAL | Status: DC

## 2011-05-28 MED ORDER — PANTOPRAZOLE SODIUM 40 MG PO TBEC: 40.0000 mg | DELAYED_RELEASE_TABLET | Freq: Every day | ORAL | Status: DC

## 2011-05-28 MED ORDER — METFORMIN HCL 500 MG PO TABS: 500.0000 mg | ORAL_TABLET | Freq: Two times a day (BID) | ORAL | Status: DC

## 2011-05-28 NOTE — Telephone Encounter (Signed)
Pt aware we have sent a 90 day supply for the Metformin, Metoprolol and Pantoprazole to [facility] on [address]. I will forward msg to Marliss Czar so she can enter labs that pt will need. Pt wants to come the week prior to his appt on 07/08/11.

## 2011-05-28 NOTE — Telephone Encounter (Signed)
Pt would like a 90 day Rx if possible.  Pt wanted to ensure that his appt on 4/22 will not mess up his sched for refilling his meds, though.  Pt requests these called into [facility] on [address].

## 2011-05-28 NOTE — Telephone Encounter (Signed)
Per SN order FLP, Liver, BMET, CBC w/ diff, TSH, Hgb A1C. Carron Curie, CMA

## 2011-05-28 NOTE — Telephone Encounter (Signed)
Called and spoke with pt.  Pt aware labs in computer for the week prior to his pending appt.

## 2011-05-28 NOTE — Telephone Encounter (Signed)
ATC pt at # listed above and was informed I had the wrong #.  Called pt's home # and reached pt's wife who informed me cell # was incorrect for pt.  Correct cell # is [phone].  LMOM for pt TCB to see if he wanted 90 day rx for his meds or 30 day and to make sure he wanted to pick these prescriptions up.

## 2011-05-29 ENCOUNTER — Ambulatory Visit: Payer: Managed Care, Other (non HMO) | Admitting: Pulmonary Disease

## 2011-07-08 ENCOUNTER — Ambulatory Visit: Payer: Managed Care, Other (non HMO) | Admitting: Pulmonary Disease

## 2011-08-01 ENCOUNTER — Other Ambulatory Visit: Payer: Self-pay | Admitting: *Deleted

## 2011-08-01 MED ORDER — ALPRAZOLAM 0.5 MG PO TABS: ORAL_TABLET | ORAL | Status: DC

## 2011-08-21 ENCOUNTER — Other Ambulatory Visit (INDEPENDENT_AMBULATORY_CARE_PROVIDER_SITE_OTHER): Payer: Managed Care, Other (non HMO)

## 2011-08-21 DIAGNOSIS — E785 Hyperlipidemia, unspecified: Secondary | ICD-10-CM

## 2011-08-21 DIAGNOSIS — IMO0001 Reserved for inherently not codable concepts without codable children: Secondary | ICD-10-CM

## 2011-08-21 DIAGNOSIS — R7989 Other specified abnormal findings of blood chemistry: Secondary | ICD-10-CM

## 2011-08-21 DIAGNOSIS — Z0000 Encounter for general adult medical examination without abnormal findings: Secondary | ICD-10-CM

## 2011-08-21 LAB — CBC WITH DIFFERENTIAL/PLATELET
Basophils Absolute: 0 10*3/uL (ref 0.0–0.1)
Eosinophils Relative: 1.4 % (ref 0.0–5.0)
HCT: 46.6 % (ref 39.0–52.0)
Hemoglobin: 15.9 g/dL (ref 13.0–17.0)
Lymphocytes Relative: 24.3 % (ref 12.0–46.0)
Lymphs Abs: 1.5 10*3/uL (ref 0.7–4.0)
Monocytes Relative: 9.3 % (ref 3.0–12.0)
Neutro Abs: 3.9 10*3/uL (ref 1.4–7.7)
RDW: 12.3 % (ref 11.5–14.6)
WBC: 6 10*3/uL (ref 4.5–10.5)

## 2011-08-21 LAB — TSH: TSH: 1.89 u[IU]/mL (ref 0.35–5.50)

## 2011-08-21 LAB — HEPATIC FUNCTION PANEL
Alkaline Phosphatase: 35 U/L — ABNORMAL LOW (ref 39–117)
Bilirubin, Direct: 0.1 mg/dL (ref 0.0–0.3)
Total Bilirubin: 1.1 mg/dL (ref 0.3–1.2)
Total Protein: 7.6 g/dL (ref 6.0–8.3)

## 2011-08-21 LAB — LDL CHOLESTEROL, DIRECT: Direct LDL: 156.2 mg/dL

## 2011-08-21 LAB — BASIC METABOLIC PANEL
Calcium: 9.6 mg/dL (ref 8.4–10.5)
GFR: 78.96 mL/min (ref >60.00)
Potassium: 5 mEq/L (ref 3.5–5.1)
Sodium: 141 mEq/L (ref 135–145)

## 2011-08-21 LAB — LIPID PANEL
HDL: 36.7 mg/dL — ABNORMAL LOW (ref >39.00)
Total CHOL/HDL Ratio: 6
Triglycerides: 290 mg/dL — ABNORMAL HIGH (ref 0.0–149.0)

## 2011-08-22 ENCOUNTER — Ambulatory Visit (INDEPENDENT_AMBULATORY_CARE_PROVIDER_SITE_OTHER): Payer: Managed Care, Other (non HMO) | Admitting: Pulmonary Disease

## 2011-08-22 ENCOUNTER — Ambulatory Visit (INDEPENDENT_AMBULATORY_CARE_PROVIDER_SITE_OTHER)
Admission: RE | Admit: 2011-08-22 | Discharge: 2011-08-22 | Disposition: A | Discharge status: Home / Self Care | Payer: Managed Care, Other (non HMO) | Source: Ambulatory Visit | Attending: Pulmonary Disease | Admitting: Pulmonary Disease

## 2011-08-22 ENCOUNTER — Encounter: Payer: Self-pay | Admitting: Pulmonary Disease

## 2011-08-22 VITALS — BP 142/94 | HR 90 | Temp 97.1°F | Ht 71.0 in | Wt 173.8 lb

## 2011-08-22 DIAGNOSIS — I10 Essential (primary) hypertension: Secondary | ICD-10-CM

## 2011-08-22 DIAGNOSIS — H349 Unspecified retinal vascular occlusion: Secondary | ICD-10-CM

## 2011-08-22 DIAGNOSIS — K219 Gastro-esophageal reflux disease without esophagitis: Secondary | ICD-10-CM

## 2011-08-22 DIAGNOSIS — K7689 Other specified diseases of liver: Secondary | ICD-10-CM

## 2011-08-22 DIAGNOSIS — K760 Fatty (change of) liver, not elsewhere classified: Secondary | ICD-10-CM

## 2011-08-22 DIAGNOSIS — D126 Benign neoplasm of colon, unspecified: Secondary | ICD-10-CM

## 2011-08-22 DIAGNOSIS — F411 Generalized anxiety disorder: Secondary | ICD-10-CM

## 2011-08-22 DIAGNOSIS — E785 Hyperlipidemia, unspecified: Secondary | ICD-10-CM

## 2011-08-22 DIAGNOSIS — E119 Type 2 diabetes mellitus without complications: Secondary | ICD-10-CM

## 2011-08-22 NOTE — Patient Instructions (Signed)
Today we updated your med list in our EPIC system...    Continue your current medications the same...    Continue your good effort at diet management- low carb, low fat...  Today we did your follow up CXR...    We will call you w/ the results this week...  You should here for DrKaplan later this summer for a follow up colonoscopy at your convenience  Call for any questions...  Let's plan a follow up visit w/ repeat labs in 6 months.Marland KitchenMarland Kitchen

## 2011-08-24 ENCOUNTER — Encounter: Payer: Self-pay | Admitting: Pulmonary Disease

## 2011-08-24 NOTE — Progress Notes (Signed)
Subjective:    Patient ID: Carlos Montgomery, male    DOB: [birth year], 46 y.o.   MRN: [id number]  HPI 46 y/o WM here for a follow up visit & review of multiple medical issues>  ~  May 23, 2010:  Carlos Montgomery reports an episode of blurry vision involving his right eye in 2011; he went to see his Optometrist Carlos Montgomery who saw a "bl clot behind my eye" & sent him to Carlos Montgomery at [facility] who dx a ?central retinal vein occlusion (we do not have records);  treated initially w/ series of 3 injections (no benefit), then Laser Rx (84 shots he says) & it has stabilized; pt says "it's from HBP" but his BP has been mild & easily treated w/ simple meds (on ToprolXL50 & BP last yr 120/82, this yr 132/84, & his hi reading at home was 138/90);  Carlos Montgomery does not have prev hx blood clots, no known hypercoag state, he is not obese, not a known diabetic (FBS=97 last yr), & an ex-smoker (quit 2000)- neg FamHx for blood clots or similar problems...  he does have a hx of a mixed hyperlipidemia on PRAV80 & FENOFIB160> while his Chol numbers have been good (TChol=167, LDL=87 last yr), his TG's have been elevated (TG 2/11 = 410) & he has struggled w/ low fat diet...    Here for CPX w/ CXR, EKG, Fasting blood work>> see below...  ~  September 06, 2010:  77mo ROV & review of what amounts to seemingly unrelated multisystem problems>>    Central Retinal Vein Occlusion> we don't have records from Optometrist or Carlos Montgomery; he reports no change in his vision (states right eye's left lower quadrant field is out, & depth perception is affected); he has f/u appt sched for 7/12...    Pulm Nodule> routine CXR 3/12 showed showed new nodular opac on left side;  CTChest 3/12 showed 14mm spiculated density LUL, neg mediastinal adenopathy, no other lesions identified (?tiny density in RML);  PETscan 3/12 showed 13mm LUL nodule w/ low metabolic activ, no hypermetabolic LNs, low metabolic activ in nodular density RML & ground glass area RLL (?infectious/  inflamm etiology);  We decided to wait & recheck 21mo= today & CXR no longer shows the LUL nodule, CXR is clear;  He remains asymptomatic> no CP, cough, sputum, hemoptysis, SOB, etc;  We discussed proceeding w/ 77mo f/u CT...    HBP> He notes BP up when stressed but usually 130s/ 80s at home;  Today BP=150/90 & we discussed adding Losartan 100mg  to his regimen; he will continue to monitor at home.    Diabetes> Newly diagnosed 3/12 CPX w/ FBS 139 & subseq A1c=8.4; he notes pos FamHx DM in one of his uncles (mother's brother); we discussed low carb diet & started Metform500 Bid; he has lost 5# down to 175# (BMI 24-25); Follow up labs today showed BS=110, A1c=7.4; rec to continue same med + diet & exercise...    Lipids> Long hx difficult lipid dilemma; Intol Crestor & Niaspan in past; Chol values intermittently improved on Prav 40-80mg  over the years & tol well but no sustained improvement, and TGs have been persistently elev despite Rx w/ Fenofibrate/ Antara; He was referred to [facility] w/ lifestyle mod education + herbal rx w/ cinnamon, milk thistle, & FishOil; See flow sheet below.    Abn LFT's> likely hepatic steatosis based on CTChest report 3/12 showing diffuse hep steatosis & norm adrenals below the diaph;  Can't rule out contibution from Statin medication (LFT's  didn't improve substantially off Prav80 4/12, but they did get worse back on Prev80 6/12)...    <<< For now rec adding Losartan100 to the ToprolXL50; Continue Metformin500Bid + diet/ exercise; Continue lifestyle mod but STOP the Prav80 & leave off Fenofib; Absolutely no Etoh or hepatotoxins & f/u LFTs> next step is further GI eval to confirm the dx & consider liver bx; Proceed w/ f/u CTChest to recheck nodule==> LUL nodule resolved, no other nodules seen, fatty change in liver...  ~  February 05, 2011:  37mo ROV & Carlos Montgomery reports doing very well w/o new complaints or concerns; denies CP, palpit, SOB, cough/phlegm, etc; BP controlled on meds, BS is normal on  Metformin, FLP however is worse off statins and LFTs remain elev although improved from 37mo ago...  We discussed diet again & will try Fibrate monotherapy for now (ANTARA 130mg /d), f/u labs, then consider very low dose statin after that;  We reviewed hepatic steatosis & need for very low fat diet> he will also need GI liver eval for completeness...  ~  August 22, 2011:  419mo ROV> Carlos Montgomery says he is doing well w/o new complaints or concerns; his vision is unchanged & he is now seeing Carlos Montgomery & pt reports another laser rx but no diff in his vision which is frustrating for him; he denies resp symptoms and today's CXR is clear, no nodules seen; BP controlled on Metop & Losartan but pt didn't bring list of meds or bottles to review;  FLP sl better on Fenofibrate but not at goals and LFTs still elev from his steatosis & we reviewed diet, exercise etc...    We reviewed prob list, meds, xrays and labs> see below>> CXR 6/13 showed normal heart size, clear lungs, NAD... LABS 6/13:  FLP- not at goals despite meds & Feno160;  Chems- BS=97 A1c=6.7 LFTs elev;  CBC- wnl;  TSH=1.89 I called Pharm- [facility] ([facility])> not regular w/ his Feno130 & last filled #30 on 06/30/11; similar for Metformin500- got #60 on 05/28/11 then #30 picked up 08/19/11...          Problem List:   RETINAL VEIN OCCLUSION, Right Eye (ICD-362.30) >> SEE ABOVE ~  6/12:  we don't have records from Optometrist or Carlos Montgomery; he reports no change in his vision (states right eye's left lower quadrant field is out, & depth perception is affected)... ~  11/12:  He continues to f/u w/ Carlos & reports no change in his vision (he estimates he can see about 1/2 out of the right eye)... ~  6/13: he tells me he is now seeing Carlos Montgomery & had another Laser treatment w/o any change in his vision; we do not have Ophthal notes.  ASTHMATIC BRONCHITIS, ACUTE (ICD-466.0) - no recent exac and doing well- denies cough, sputum, hemoptysis, worsening dyspnea, wheezing, chest  pains, snoring, daytime hypersomnolence, etc... No meds required.  PULMONARY NODULE >> SEE ABOVE ~  3/12:  routine CXR showed showed new nodular opac on left side>>  ~  CTChest 3/12 showed 14mm spiculated density LUL, neg mediastinal adenopathy, no other lesions identified (?tiny density in RML);   ~  PETscan 3/12 showed 13mm LUL nodule w/ low metabolic activ, no hypermetabolic LNs, low metabolic activ in nodular density RML & ground glass area RLL (?infectious/ inflamm etiology);  We decided to wait & recheck in 19mo... ~  6/12:  f/u CXR no longer shows the LUL nodule, CXR is clear;  He remains asymptomatic> no CP, cough, sputum, hemoptysis, SOB, etc;   ~  f/u CTChest 6/12 ==> negative, nodule resolved, no lesions seen, fatty change noted in liver... ~  CXR 6/13 showed normal heart size, clear lungs, no nodules seen...  HYPERTENSION (ICD-401.9) - on ASA 81mg /d,  TOPROL XL 50mg /d, & LOSARTAN 100mg /d added 6/12...  ~  6/12:  He notes BP up when stressed but usually 130s/ 80s at home; Today BP=150/90 & we discussed adding LOSARTAN 100mg  to his regimen; he will continue to monitor at home. ~  11/12:  BP= 122/82 & feeling well, asymptomatic- denies HA, fatigue, CP, palipit, dizziness, syncope, dyspnea, edema, etc. ~  6/13:  BP= 142/94 but he notes better at home & didn't take meds today; he remains asymptomatic.  HYPERLIPIDEMIA (ICD-272.4) - prev on Pravachol 80mg /d & Fenofibrate 160mg /d... Years ago he was [facility] & INTOL to Crestor & Niaspan... ~  FLP 8/08 ?on Prav40 & Antara130 showed TChol 206, TG 215, HDL 31, LDL 167 ~  FLP 8/09 ?on Prav40 showed TChol 293, TG 280, HDL 36, LDL 188... rec> incr Prav80. ~  FLP 2/11 on Prav80 showed TChol 167, TG 410, HDL 40, LDL 87... rec> add Fenofib160. ~  FLP 3/12 on Prav80+Feno160 showed TChol 211, TG 313, HDL 30, LDL 150... But LFTs abn w/ SGOT=85, SGPT=73; Rec to stop Prav80. ~  FLP 4/12 on Feno160 showed TChol 245, TG 348, HDL 33, LDL  176... LFTs showed SGOT=64, SGPT=79; Sent to [facility] & they stopped Feno160, restarted Prav80. ~  FLP 6/12 on Prav80 showed TChol 211, TG 316, HDL 44, LDL 131... But LFTs worse SGOT=101, SGPT=179; Rec to stop all meds, use diet & exercise for now. ~  FLP 11/12 on diet alone showed TChol 232, TG 314, HDL 33, LDL 170... LFT's improved by still abn; REC to try ANTARA 130mg /d monotherapy (ch to [id number]). ~  FLP 6/13 on Feno130 showed TChol 230, TG 290, HDL 37, LDL 156... Pharm confirms not taking meds regularly.  DIABETES MELLITUS >> on METFORMIN 500mg  Bid + diet rx... ~  3/12:  Newly diagnosed 3/12 CPX w/ FBS 139 & subseq A1c=8.4; he notes pos FamHx DM in one of his uncles (mother's brother); Rec low carb diet & start Metform500 Bid... ~  6/12:  he has lost 5# down to 175# (BMI 24-25); Follow up labs today showed BS=110, A1c=7.4; rec to continue same med + diet & exercise... ~  Labs 11/12 on Metform500Bid showed BS= 80, A1c not done... ~  Labs 6/13 on Metform500Bid showed BS=97, A1c=6.7.Marland KitchenMarland Kitchen Improved, continue same...  GERD (ICD-530.81) - on PROTONIX 40mg /d... ~  last EGD 9/07 by Dr Arlyce Dice was WNL...  COLONIC POLYPS (ICD-211.3) - colonoscopy 8/08 by Carlos Montgomery showed several 5mm polyps, otherw neg... bx= adenomatous w/ f/u planned 76yrs.  ABNORMAL LFTs/ HEPATIC STEATOSIS >>  likely hepatic steatosis based on CTChest report 3/12 showing diffuse hep steatosis & norm adrenals below the diaph;  Can't rule out contibution from Statin medication (LFT's didn't improve substantially off Prav80 4/12, but they did get worse back on Prev80 6/12, & subseq improved off the statin 11/12)... He will need GI eval for completeness to r/o other potential caused of incr LFTs... ~  See lab cumulative summary sheets for the numbers...  PEYRONIE'S DISEASE (ICD-607.89) - he saw Carlos Montgomery in 1998 for poss early Peyronies...  Hx of NECK PAIN (ICD-723.1) - eval by Carlos Montgomery in 2005 for paresthesias... ?MRI's done- we never received  f/u note.  ANXIETY (ICD-300.00) - on ALPRAZOLAM 0.5mg  Prn...  Health Maintenance:   ~  GI:  had colon 8/08 by Carlos Montgomery w/ 5mm polyp, f/u planned 31yrs... ~  GU:  prostate exam= norm, 2+ no nodules, stool heme neg... ~  Immunizations:  ?last tetanus shot, he refuses Flu vaccines...   No past surgical history on file.   Outpatient Encounter Prescriptions as of 08/22/2011  Medication Sig Dispense Refill  . ALPRAZolam (XANAX) 0.5 MG tablet 1/2 to 1 tablet three times daily as needed  90 tablet  5  . aspirin 81 MG tablet Take 2 tablets daily      . Cinnamon 500 MG TABS Take 3 tablets by mouth daily.        . fenofibrate micronized (ANTARA) 130 MG capsule Take 1 capsule (130 mg total) by mouth daily before breakfast.  30 capsule  11  . fish oil-omega-3 fatty acids 1000 MG capsule Take 3 g by mouth daily.        Marland Kitchen losartan (COZAAR) 100 MG tablet Take 1 tablet (100 mg total) by mouth daily.  30 tablet  11  . magnesium oxide (MAG-OX) 400 MG tablet Take 400 mg by mouth daily.      . metFORMIN (GLUCOPHAGE) 500 MG tablet Take 1 tablet (500 mg total) by mouth 2 (two) times daily with a meal.  90 tablet  0  . metoprolol succinate (TOPROL-XL) 50 MG 24 hr tablet Take 1 tablet (50 mg total) by mouth daily.  90 tablet  0  . pantoprazole (PROTONIX) 40 MG tablet Take 1 tablet (40 mg total) by mouth daily.  90 tablet  0    Allergies  Allergen Reactions  . Codeine   . Niacin     REACTION: pt states INTOL to Niaspan  . Penicillins   . Rosuvastatin     REACTION: pt states INTOL to Crestor    Current Medications, Allergies, Past Medical History, Past Surgical History, Family History, and Social History were reviewed in [employer] record.    Review of Systems        The patient complains of blurring, vision loss, and anxiety.  The patient denies fever, chills, sweats, anorexia, fatigue, weakness, malaise, weight loss, sleep disorder, diplopia, eye irritation, eye discharge, eye  pain, photophobia, earache, ear discharge, tinnitus, decreased hearing, nasal congestion, nosebleeds, sore throat, hoarseness, chest pain, palpitations, syncope, dyspnea on exertion, orthopnea, PND, peripheral edema, cough, dyspnea at rest, excessive sputum, hemoptysis, wheezing, pleurisy, nausea, vomiting, diarrhea, constipation, change in bowel habits, abdominal pain, melena, hematochezia, jaundice, gas/bloating, indigestion/heartburn, dysphagia, odynophagia, dysuria, hematuria, urinary frequency, urinary hesitancy, nocturia, incontinence, back pain, joint pain, joint swelling, muscle cramps, muscle weakness, stiffness, arthritis, sciatica, restless legs, leg pain at night, leg pain with exertion, rash, itching, dryness, suspicious lesions, paralysis, paresthesias, seizures, tremors, vertigo, transient blindness, frequent falls, frequent headaches, difficulty walking, depression, memory loss, confusion, cold intolerance, heat intolerance, polydipsia, polyphagia, polyuria, unusual weight change, abnormal bruising, bleeding, enlarged lymph nodes, urticaria, allergic rash, hay fever, and recurrent infections.     Objective:   Physical Exam    WD, WN, 46 y/o WM in NAD... GENERAL:  Alert & oriented; pleasant & cooperative... HEENT:  [place]/AT, EOM-wnl, Hx retinal vein occlusion rt eye, EACs-clear, TMs-wnl, NOSE-clear, THROAT-clear & wnl. NECK:  Supple w/ full ROM; no JVD; normal carotid impulses w/o bruits; no thyromegaly or nodules palpated; no lymphadenopathy. CHEST:  Clear to P & A; without wheezes/ rales/ or rhonchi. HEART:  Regular Rhythm; without murmurs/ rubs/ or gallops. ABDOMEN:  Soft & nontender; normal bowel sounds; no organomegaly or  masses detected. (RECTAL:  Neg - prostate 2+ & nontender w/o nodules; stool hematest neg) EXT: without deformities or arthritic changes; no varicose veins/ venous insuffic/ or edema. NEURO:  CN's intact; motor testing normal; sensory testing normal; gait normal &  balance OK. DERM:  No lesions noted; no rash etc...  RADIOLOGY DATA:  Reviewed in the EPIC EMR & discussed w/ the patient...  LABORATORY DATA:  Reviewed in the EPIC EMR & discussed w/ the patient...   Assessment & Plan:   RETINAL VEIN OCCLUSION>  we don't have records from Ophthal; he reports no change in his vision (states right eye's left lower quadrant field is out, & depth perception is affected); he continues to f/u w/ Carlos Montgomery now...  Hx of PULM NODULE>  CXR no longer shows the LUL nodule, CXR is clear & CT confirms resolution of nodule, no residual abn seen;  He remains asymptomatic> no CP, cough, sputum, hemoptysis, SOB, etc;  We will continue to follow his CXRs...  HBP>  He notes BP up when stressed but usually 130s/ 80s at home;  Today BP=140/90 on ToprolXL50 & Losartan100; he will continue to monitor at home.  LIPIDS>  Long hx difficult lipid dilemma;  We stopped both meds & on Diet/ Exercise alone his FLP is worse & LFTs are sl better;  We decided to try ANTARA monotherapy + his best diet efforts w/ f/u in 3-4 months> Pharm confirms that he is not filling Rx regularly- consider Mar[facility]...  DM>  Follow up labs today showed A1c improved at 6.7;  rec to continue same med + diet & exercise...  LFTs>  likely hepatic steatosis based on CTChest report 3/12 showing diffuse hep steatosis & norm adrenals below the diaph;  Can't rule out contibution from Statin medication (LFT's didn't improve substantially off Prav80 4/12, but they did get worse back on Prev80 6/12 & then improved some off the statin 11/12)... We will need to refer to GI for formal Liver eval & poss bx...  Other medical problems as noted...   Patient's Medications  New Prescriptions   No medications on file  Previous Medications   ALPRAZOLAM (XANAX) 0.5 MG TABLET    1/2 to 1 tablet three times daily as needed   ASPIRIN 81 MG TABLET    Take 2 tablets daily   CINNAMON 500 MG TABS    Take 3 tablets by mouth daily.       FENOFIBRATE MICRONIZED (ANTARA) 130 MG CAPSULE    Take 1 capsule (130 mg total) by mouth daily before breakfast.   FISH OIL-OMEGA-3 FATTY ACIDS 1000 MG CAPSULE    Take 3 g by mouth daily.     LOSARTAN (COZAAR) 100 MG TABLET    Take 1 tablet (100 mg total) by mouth daily.   MAGNESIUM OXIDE (MAG-OX) 400 MG TABLET    Take 400 mg by mouth daily.   METFORMIN (GLUCOPHAGE) 500 MG TABLET    Take 1 tablet (500 mg total) by mouth 2 (two) times daily with a meal.   METOPROLOL SUCCINATE (TOPROL-XL) 50 MG 24 HR TABLET    Take 1 tablet (50 mg total) by mouth daily.   PANTOPRAZOLE (PROTONIX) 40 MG TABLET    Take 1 tablet (40 mg total) by mouth daily.  Modified Medications   No medications on file  Discontinued Medications   No medications on file

## 2011-09-06 ENCOUNTER — Other Ambulatory Visit: Payer: Self-pay | Admitting: Pulmonary Disease

## 2011-10-01 ENCOUNTER — Other Ambulatory Visit: Payer: Self-pay | Admitting: Pulmonary Disease

## 2011-10-06 ENCOUNTER — Other Ambulatory Visit: Payer: Self-pay | Admitting: Pulmonary Disease

## 2011-10-18 ENCOUNTER — Other Ambulatory Visit: Payer: Self-pay | Admitting: Pulmonary Disease

## 2012-02-11 ENCOUNTER — Other Ambulatory Visit: Payer: Self-pay | Admitting: Pulmonary Disease

## 2012-02-17 ENCOUNTER — Telehealth: Payer: Self-pay | Admitting: Pulmonary Disease

## 2012-02-17 NOTE — Telephone Encounter (Signed)
Last OV 08/22/11. Please advise thanks

## 2012-02-18 ENCOUNTER — Other Ambulatory Visit: Payer: Self-pay | Admitting: Pulmonary Disease

## 2012-02-18 DIAGNOSIS — E785 Hyperlipidemia, unspecified: Secondary | ICD-10-CM

## 2012-02-18 DIAGNOSIS — D126 Benign neoplasm of colon, unspecified: Secondary | ICD-10-CM

## 2012-02-18 DIAGNOSIS — E119 Type 2 diabetes mellitus without complications: Secondary | ICD-10-CM

## 2012-02-18 DIAGNOSIS — I10 Essential (primary) hypertension: Secondary | ICD-10-CM

## 2012-02-18 DIAGNOSIS — R7989 Other specified abnormal findings of blood chemistry: Secondary | ICD-10-CM

## 2012-02-18 NOTE — Telephone Encounter (Signed)
Pt returned call.  Call him back @ [phone]. Leanora Ivanoff

## 2012-02-18 NOTE — Telephone Encounter (Signed)
Called, spoke with pt.  He is aware labs are in and pls come fasting for these.  He verbalized understanding and voiced no further questions/concerns at this time.

## 2012-02-18 NOTE — Telephone Encounter (Signed)
Called # provided above - [email or web address] 

## 2012-02-18 NOTE — Telephone Encounter (Signed)
Lab orders have been placed for the pt.  He will need to come in fasting for these.  thanks

## 2012-02-24 ENCOUNTER — Other Ambulatory Visit (INDEPENDENT_AMBULATORY_CARE_PROVIDER_SITE_OTHER): Payer: Managed Care, Other (non HMO)

## 2012-02-24 DIAGNOSIS — R7989 Other specified abnormal findings of blood chemistry: Secondary | ICD-10-CM

## 2012-02-24 DIAGNOSIS — E119 Type 2 diabetes mellitus without complications: Secondary | ICD-10-CM

## 2012-02-24 DIAGNOSIS — I10 Essential (primary) hypertension: Secondary | ICD-10-CM

## 2012-02-24 LAB — HEPATIC FUNCTION PANEL
ALT: 119 U/L — ABNORMAL HIGH (ref 0–53)
AST: 76 U/L — ABNORMAL HIGH (ref 0–37)
Albumin: 4.5 g/dL (ref 3.5–5.2)
Alkaline Phosphatase: 36 U/L — ABNORMAL LOW (ref 39–117)
Bilirubin, Direct: 0.1 mg/dL (ref 0.0–0.3)
Total Protein: 7.5 g/dL (ref 6.0–8.3)

## 2012-02-24 LAB — BASIC METABOLIC PANEL
BUN: 13 mg/dL (ref 6–23)
Calcium: 9.1 mg/dL (ref 8.4–10.5)
Creatinine, Ser: 1 mg/dL (ref 0.4–1.5)
GFR: 84.15 mL/min (ref >60.00)

## 2012-02-24 LAB — LIPID PANEL: Cholesterol: 208 mg/dL — ABNORMAL HIGH (ref 0–200)

## 2012-02-24 LAB — LDL CHOLESTEROL, DIRECT: Direct LDL: 143.5 mg/dL

## 2012-02-24 LAB — HEMOGLOBIN A1C: Hgb A1c MFr Bld: 8.6 % — ABNORMAL HIGH (ref 4.6–6.5)

## 2012-02-25 ENCOUNTER — Encounter: Payer: Self-pay | Admitting: *Deleted

## 2012-02-26 ENCOUNTER — Ambulatory Visit (INDEPENDENT_AMBULATORY_CARE_PROVIDER_SITE_OTHER): Payer: Managed Care, Other (non HMO) | Admitting: Pulmonary Disease

## 2012-02-26 ENCOUNTER — Encounter: Payer: Self-pay | Admitting: Pulmonary Disease

## 2012-02-26 VITALS — BP 142/102 | HR 83 | Temp 98.5°F | Ht 71.0 in | Wt 184.0 lb

## 2012-02-26 DIAGNOSIS — E119 Type 2 diabetes mellitus without complications: Secondary | ICD-10-CM

## 2012-02-26 DIAGNOSIS — H349 Unspecified retinal vascular occlusion: Secondary | ICD-10-CM

## 2012-02-26 DIAGNOSIS — K760 Fatty (change of) liver, not elsewhere classified: Secondary | ICD-10-CM

## 2012-02-26 DIAGNOSIS — K219 Gastro-esophageal reflux disease without esophagitis: Secondary | ICD-10-CM

## 2012-02-26 DIAGNOSIS — D126 Benign neoplasm of colon, unspecified: Secondary | ICD-10-CM

## 2012-02-26 DIAGNOSIS — K7689 Other specified diseases of liver: Secondary | ICD-10-CM

## 2012-02-26 DIAGNOSIS — E785 Hyperlipidemia, unspecified: Secondary | ICD-10-CM

## 2012-02-26 DIAGNOSIS — I10 Essential (primary) hypertension: Secondary | ICD-10-CM

## 2012-02-26 MED ORDER — LOSARTAN POTASSIUM 100 MG PO TABS: 100.0000 mg | ORAL_TABLET | Freq: Every day | ORAL | Status: DC

## 2012-02-26 MED ORDER — ALPRAZOLAM 0.5 MG PO TABS: ORAL_TABLET | ORAL | Status: DC

## 2012-02-26 MED ORDER — FENOFIBRATE 160 MG PO TABS: 160.0000 mg | ORAL_TABLET | Freq: Every day | ORAL | Status: DC

## 2012-02-26 MED ORDER — PANTOPRAZOLE SODIUM 40 MG PO TBEC: 40.0000 mg | DELAYED_RELEASE_TABLET | Freq: Every day | ORAL | Status: DC

## 2012-02-26 MED ORDER — METFORMIN HCL 500 MG PO TABS: 500.0000 mg | ORAL_TABLET | Freq: Two times a day (BID) | ORAL | Status: DC

## 2012-02-26 MED ORDER — SAXAGLIPTIN HCL 5 MG PO TABS: 5.0000 mg | ORAL_TABLET | Freq: Every day | ORAL | Status: DC

## 2012-02-26 MED ORDER — AMLODIPINE BESYLATE 5 MG PO TABS: 5.0000 mg | ORAL_TABLET | Freq: Every day | ORAL | Status: DC

## 2012-02-26 NOTE — Patient Instructions (Addendum)
Today we updated your med list in our EPIC system...     For your Lipids>> you need a low chol/ low fat diet 7 weight reduction...    We decided to change to FENOFIBRATE 160mg /d...  For your DM>> you need a low carb/ no sweets diet...    Continue the Metformin 500mg  twice daily at breakfast & dinner...    Add the new ONGLYZA 5mg  one tab daily...  For your BP>> remember to elim salt from your diet...    Continue the Metoprolol 50mg  one daily, and the Losartan 100mg  one tab daily...    Add the new AMLODIPINE 5mg  one tab daily...  Let's plan a recheck in 3-4 months to check your progress & weight reduction.Marland KitchenMarland Kitchen

## 2012-02-26 NOTE — Progress Notes (Signed)
Subjective:    Patient ID: Carlos Montgomery, male    DOB: [birth year], 46 y.o.   MRN: [id number]  HPI 46 y/o WM here for a follow up visit & review of multiple medical issues>  ~  May 23, 2010:  Carlos Montgomery reports an episode of blurry vision involving his right eye in 2011; he went to see his Optometrist [name] who saw a "bl clot behind my eye" & sent him to [name] at [facility] who dx a ?central retinal vein occlusion (we do not have records);  treated initially w/ series of 3 injections (no benefit), then Laser Rx (84 shots he says) & it has stabilized; pt says "it's from HBP" but his BP has been mild & easily treated w/ simple meds (on ToprolXL50 & BP last yr 120/82, this yr 132/84, & his hi reading at home was 138/90);  Carlos Montgomery does not have prev hx blood clots, no known hypercoag state, he is not obese, not a known diabetic (FBS=97 last yr), & an ex-smoker (quit 2000)- neg FamHx for blood clots or similar problems...  he does have a hx of a mixed hyperlipidemia on PRAV80 & FENOFIB160> while his Chol numbers have been good (TChol=167, LDL=87 last yr), his TG's have been elevated (TG 2/11 = 410) & he has struggled w/ low fat diet...    Here for CPX w/ CXR, EKG, Fasting blood work>> see below...  ~  September 06, 2010:  70mo ROV & review of what amounts to seemingly unrelated multisystem problems>>    Central Retinal Vein Occlusion> we don't have records from Optometrist or [facility] DrMathews; he reports no change in his vision (states right eye's left lower quadrant field is out, & depth perception is affected); he has f/u appt sched for 7/12...    Pulm Nodule> routine CXR 3/12 showed showed new nodular opac on left side;  CTChest 3/12 showed 14mm spiculated density LUL, neg mediastinal adenopathy, no other lesions identified (?tiny density in RML);  PETscan 3/12 showed 13mm LUL nodule w/ low metabolic activ, no hypermetabolic LNs, low metabolic activ in nodular density RML & ground glass area RLL (?infectious/  inflamm etiology);  We decided to wait & recheck 64mo= today & CXR no longer shows the LUL nodule, CXR is clear;  He remains asymptomatic> no CP, cough, sputum, hemoptysis, SOB, etc;  We discussed proceeding w/ 70mo f/u CT...    HBP> He notes BP up when stressed but usually 130s/ 80s at home;  Today BP=150/90 & we discussed adding Losartan 100mg  to his regimen; he will continue to monitor at home.    Diabetes> Newly diagnosed 3/12 CPX w/ FBS 139 & subseq A1c=8.4; he notes pos FamHx DM in one of his uncles (mother's brother); we discussed low carb diet & started Metform500 Bid; he has lost 5# down to 175# (BMI 24-25); Follow up labs today showed BS=110, A1c=7.4; rec to continue same med + diet & exercise...    Lipids> Long hx difficult lipid dilemma; Intol Crestor & Niaspan in past; Chol values intermittently improved on Prav 40-80mg  over the years & tol well but no sustained improvement, and TGs have been persistently elev despite Rx w/ Fenofibrate/ Antara; He was referred to [facility] w/ lifestyle mod education + herbal rx w/ cinnamon, milk thistle, & FishOil; See flow sheet below.    Abn LFT's> likely hepatic steatosis based on CTChest report 3/12 showing diffuse hep steatosis & norm adrenals below the diaph;  Can't rule out contibution from Statin medication (LFT's  didn't improve substantially off Prav80 4/12, but they did get worse back on Prev80 6/12)...    <<< For now rec adding Losartan100 to the ToprolXL50; Continue Metformin500Bid + diet/ exercise; Continue lifestyle mod but STOP the Prav80 & leave off Fenofib; Absolutely no Etoh or hepatotoxins & f/u LFTs> next step is further GI eval to confirm the dx & consider liver bx; Proceed w/ f/u CTChest to recheck nodule==> LUL nodule resolved, no other nodules seen, fatty change in liver...  ~  February 05, 2011:  88mo ROV & Carlos Montgomery reports doing very well w/o new complaints or concerns; denies CP, palpit, SOB, cough/phlegm, etc; BP controlled on meds, BS is normal on  Metformin, FLP however is worse off statins and LFTs remain elev although improved from 88mo ago...  We discussed diet again & will try Fibrate monotherapy for now (ANTARA 130mg /d), f/u labs, then consider very low dose statin after that;  We reviewed hepatic steatosis & need for very low fat diet> he will also need GI liver eval for completeness...  ~  August 22, 2011:  67mo ROV> Carlos Montgomery says he is doing well w/o new complaints or concerns; his vision is unchanged & he is now seeing [name] & pt reports another laser rx but no diff in his vision which is frustrating for him; he denies resp symptoms and today's CXR is clear, no nodules seen; BP controlled on Metop & Losartan but pt didn't bring list of meds or bottles to review;  FLP sl better on Fenofibrate but not at goals and LFTs still elev from his steatosis & we reviewed diet, exercise etc...    We reviewed prob list, meds, xrays and labs> see below>> CXR 6/13 showed normal heart size, clear lungs, NAD... LABS 6/13:  FLP- not at goals despite meds & Feno160;  Chems- BS=97 A1c=6.7 LFTs elev;  CBC- wnl;  TSH=1.89 I called Pharm- [facility])> not regular w/ his Feno130 & last filled #30 on 06/30/11; similar for Metformin500- got #60 on 05/28/11 then #30 picked up 08/19/11...  ~  February 26, 2012:  67mo ROV & Carlos Montgomery says he's doing well but concerned he is here over the holidays & notes that he's gained 10#;  He tells me that he is taking his meds regularly but control is worse (see below) & a call to his Pharm ([facility] [address]) confirms that his is NOT regular w/ his refills; he notes DOE w/ activity & we discussed poss cardiac eval soon...  We reviewed the following problems for today's office visit>>    Retinal Vein Occlusion> followed by [name]- we do not have recent notes; pt states that vision in right eye is the same, no change...    AB, HxPulmNodule> no resp exac, breathing is good; LUL nodule resolved on CXR/ CT in 2012; last CXR 6/13 was  clear...    HBP> on MetopER50 (refilled #30 11/26, 10/19, 9/4) & Losar100 (refilled #30 11/26, 9/4); he says taking daily- BP=142/100 & he denies HA/ CP/ palpit/ SOB/ edema; we decided to add Amlodipine5mg /d...    Hyperlipid> on Antara130 (refilled #30 11/26, 10/19, 7/16); FLP shows TChol 208, TG 326, HDL 27, LDL 144; we reviewed low chol/ low fat diet, wt reduction strategies, and decided to increase Fenofibrate 160mg /d...    DM> on Metform500Bid (refilled #60 11/26, 10/19, 9/4); Labs show BS=132, A1c=8.6; re reviewed low carb, no sweets diet & decided to add Onglyza5mg /d...    GI- GERD, Polyps> on Protonix40; last colonoscopy by drKaplan was 8/08  w/ several adenomatous polyps removed; f/u planned 9yrs & due 8/13- we will call GI...    Abn LFTs, Hepatic steatosis> he has gained 10# to 184# today & LFTs remain abn w/ SGOT=76, SGPT=119; CT Chest 3/12 revealed hep steatosis, he knows to elim alcohol & the importance of weight reduction...    Anxiety> on Alprazolam 0.5mg  prn... We reviewed prob list, meds, xrays and labs> see below for updates >>  LABS 12/13:  FLP- not at goals on Feno130;  Chems- BS=132 A1c=8.6 LFTs=abn           Problem List:   RETINAL VEIN OCCLUSION, Right Eye (ICD-362.30) >> SEE ABOVE ~  6/12:  we don't have records from Optometrist or [facility] DrMathews; he reports no change in his vision (states right eye's left lower quadrant field is out, & depth perception is affected)... ~  11/12:  He continues to f/u w/ [facility] & reports no change in his vision (he estimates he can see about 1/2 out of the right eye)... ~  6/13 & 12/13: he tells me he is now seeing [name] & had another Laser treatment w/o any change in his vision; we do not have Ophthal notes.  ASTHMATIC BRONCHITIS, ACUTE (ICD-466.0) - no recent exac and doing well- denies cough, sputum, hemoptysis, worsening dyspnea, wheezing, chest pains, snoring, daytime hypersomnolence, etc... No meds required.  PULMONARY NODULE >> SEE  ABOVE ~  3/12:  routine CXR showed showed new nodular opac on left side>>  ~  CTChest 3/12 showed 14mm spiculated density LUL, neg mediastinal adenopathy, no other lesions identified (?tiny density in RML);   ~  PETscan 3/12 showed 13mm LUL nodule w/ low metabolic activ, no hypermetabolic LNs, low metabolic activ in nodular density RML & ground glass area RLL (?infectious/ inflamm etiology);  We decided to wait & recheck in 40mo... ~  6/12:  f/u CXR no longer shows the LUL nodule, CXR is clear;  He remains asymptomatic> no CP, cough, sputum, hemoptysis, SOB, etc;   ~  f/u CTChest 6/12 ==> negative, nodule resolved, no lesions seen, fatty change noted in liver... ~  CXR 6/13 showed normal heart size, clear lungs, no nodules seen...  HYPERTENSION (ICD-401.9) - on ASA 81mg /d,  TOPROL XL 50mg /d, & LOSARTAN 100mg /d added 6/12...  ~  6/12:  He notes BP up when stressed but usually 130s/ 80s at home; Today BP=150/90 & we discussed adding LOSARTAN 100mg  to his regimen; he will continue to monitor at home. ~  11/12:  BP= 122/82 & feeling well, asymptomatic- denies HA, fatigue, CP, palipit, dizziness, syncope, dyspnea, edema, etc. ~  6/13:  BP= 142/94 but he notes better at home & didn't take meds today; he remains asymptomatic. ~  12/13:  BP= 140/100 & he says taking meds regularly but Pharm confirms NOT> on MetopER50 (refilled #30 11/26, 10/19, 9/4) & Losar100 (refilled #30 11/26, 9/4); he denies HA/ CP/ palpit/ SOB/ edema; we decided to add Amlodipine5mg /d...  HYPERLIPIDEMIA (ICD-272.4) - prev on Pravachol 80mg /d & Fenofibrate 160mg /d... Years ago he was [facility] & INTOL to Crestor & Niaspan... ~  FLP 8/08 ?on Prav40 & Antara130 showed TChol 206, TG 215, HDL 31, LDL 167 ~  FLP 8/09 ?on Prav40 showed TChol 293, TG 280, HDL 36, LDL 188... rec> incr Prav80. ~  FLP 2/11 on Prav80 showed TChol 167, TG 410, HDL 40, LDL 87... rec> add Fenofib160. ~  FLP 3/12 on Prav80+Feno160 showed TChol 211,  TG 313, HDL 30, LDL  150... But LFTs abn w/ SGOT=85, SGPT=73; Rec to stop Prav80. ~  FLP 4/12 on Feno160 showed TChol 245, TG 348, HDL 33, LDL 176... LFTs showed SGOT=64, SGPT=79; [facility] to [facility] & they stopped Feno160, restarted Prav80. ~  FLP 6/12 on Prav80 showed TChol 211, TG 316, HDL 44, LDL 131... But LFTs worse SGOT=101, SGPT=179; Rec to stop all meds, use diet & exercise for now. ~  FLP 11/12 on diet alone showed TChol 232, TG 314, HDL 33, LDL 170... LFT's improved by still abn; REC to try ANTARA 130mg /d monotherapy (ch to [id number]). ~  FLP 6/13 on Feno130 showed TChol 230, TG 290, HDL 37, LDL 156... Pharm confirms not taking meds regularly. ~  FLP 12/13 on Feno130 showed TChol 208, TG 326, HDL 27, LDL 144... Pharm confirms refills #30 11/26, 10/19, 7/16; Rec to incr to Feno160 + diet...  DIABETES MELLITUS >> on METFORMIN 500mg  Bid + diet rx... ~  3/12:  Newly diagnosed 3/12 CPX w/ FBS 139 & subseq A1c=8.4; he notes pos FamHx DM in one of his uncles (mother's brother); Rec low carb diet & start Metform500 Bid... ~  6/12:  he has lost 5# down to 175# (BMI 24-25); Follow up labs today showed BS=110, A1c=7.4; rec to continue same med + diet & exercise... ~  Labs 11/12 on Metform500Bid showed BS= 80, A1c not done... ~  Labs 6/13 on Metform500Bid showed BS=97, A1c=6.7.Marland KitchenMarland Kitchen Improved, continue same... ~  Labs 12/13 on Metform500Bid (refilled #60 11/26, 10/19, 9/4) showed BS=132, A1c=8.6; Rec to take daily & add ONGLYZA5...  GERD (ICD-530.81) - on PROTONIX 40mg /d... ~  last EGD 9/07 by Dr Arlyce Dice was WNL...  COLONIC POLYPS (ICD-211.3) - colonoscopy 8/08 by Dorris Singh showed several 5mm polyps, otherw neg... bx= adenomatous w/ f/u planned 41yrs. ~  12/13:  He did not receive letter from GI regarding time for f/u colonoscopy; we will refer chart to DrKaplan...  ABNORMAL LFTs/ HEPATIC STEATOSIS >>  likely hepatic steatosis based on CTChest report 3/12 showing diffuse hep steatosis & norm adrenals below the  diaph;  Can't rule out contibution from Statin medication (LFT's didn't improve substantially off Prav80 4/12, but they did get worse back on Prev80 6/12, & subseq improved off the statin 11/12)... He will need GI eval for completeness to r/o other potential caused of incr LFTs... ~  See lab cumulative summary sheets for the numbers...  PEYRONIE'S DISEASE (ICD-607.89) - he saw DrDahlstedt in 1998 for poss early Peyronies...  Hx of NECK PAIN (ICD-723.1) - eval by DrWeymann in 2005 for paresthesias... ?MRI's done- we never received f/u note.  ANXIETY (ICD-300.00) - on ALPRAZOLAM 0.5mg  Prn...  Health Maintenance:   ~  GI:  had colon 8/08 by Dorris Singh w/ 5mm polyp, f/u planned 50yrs... ~  GU:  prostate exam= norm, 2+ no nodules, stool heme neg... ~  Immunizations:  ?last tetanus shot, he refuses Flu vaccines...   No past surgical history on file.   Outpatient Encounter Prescriptions as of 02/26/2012  Medication Sig Dispense Refill  . ALPRAZolam (XANAX) 0.5 MG tablet Take 1/2 to 1 tablet by mouth three times daily as needed  90 tablet  5  . amLODipine (NORVASC) 5 MG tablet Take 1 tablet (5 mg total) by mouth daily.  90 tablet  3  . aspirin 81 MG tablet Take 2 tablets daily      . Cinnamon 500 MG TABS Take 3 tablets by mouth daily.        . fenofibrate 160  MG tablet Take 1 tablet (160 mg total) by mouth daily.  90 tablet  3  . fish oil-omega-3 fatty acids 1000 MG capsule Take 3 g by mouth daily.        Marland Kitchen losartan (COZAAR) 100 MG tablet Take 1 tablet (100 mg total) by mouth daily.  90 tablet  3  . magnesium oxide (MAG-OX) 400 MG tablet Take 400 mg by mouth daily.      . metFORMIN (GLUCOPHAGE) 500 MG tablet Take 1 tablet (500 mg total) by mouth 2 (two) times daily with a meal.  180 tablet  3  . metoprolol succinate (TOPROL-XL) 50 MG 24 hr tablet TAKE 1 TABLET (50 MG TOTAL) BY MOUTH DAILY.  90 tablet  5  . pantoprazole (PROTONIX) 40 MG tablet Take 1 tablet (40 mg total) by mouth daily.  90 tablet   3  . saxagliptin HCl (ONGLYZA) 5 MG TABS tablet Take 1 tablet (5 mg total) by mouth daily.  90 tablet  3  . [DISCONTINUED] ALPRAZolam (XANAX) 0.5 MG tablet TAKE 1/2 TO 1 TABLET 3 TIMES A DAY AS NEEDED  90 tablet  0  . [DISCONTINUED] amLODipine (NORVASC) 5 MG tablet Take 5 mg by mouth daily.      . [DISCONTINUED] fenofibrate 160 MG tablet Take 160 mg by mouth daily.      . [DISCONTINUED] losartan (COZAAR) 100 MG tablet TAKE 1 TABLET BY MOUTH EVERY DAY  30 tablet  6  . [DISCONTINUED] metFORMIN (GLUCOPHAGE) 500 MG tablet TAKE 1 TABLET (500 MG TOTAL) BY MOUTH 2 (TWO) TIMES DAILY WITH A MEAL.  60 tablet  11  . [DISCONTINUED] pantoprazole (PROTONIX) 40 MG tablet TAKE 1 TABLET (40 MG TOTAL) BY MOUTH DAILY.  90 tablet  0  . [DISCONTINUED] saxagliptin HCl (ONGLYZA) 5 MG TABS tablet Take 5 mg by mouth daily.      . [DISCONTINUED] fenofibrate micronized (ANTARA) 130 MG capsule Take 1 capsule (130 mg total) by mouth daily before breakfast.  30 capsule  11    Allergies  Allergen Reactions  . Codeine   . Niacin     REACTION: pt states INTOL to Niaspan  . Penicillins   . Rosuvastatin     REACTION: pt states INTOL to Crestor    Current Medications, Allergies, Past Medical History, Past Surgical History, Family History, and Social History were reviewed in [employer] record.    Review of Systems        The patient complains of blurring, vision loss, and anxiety.  The patient denies fever, chills, sweats, anorexia, fatigue, weakness, malaise, weight loss, sleep disorder, diplopia, eye irritation, eye discharge, eye pain, photophobia, earache, ear discharge, tinnitus, decreased hearing, nasal congestion, nosebleeds, sore throat, hoarseness, chest pain, palpitations, syncope, dyspnea on exertion, orthopnea, PND, peripheral edema, cough, dyspnea at rest, excessive sputum, hemoptysis, wheezing, pleurisy, nausea, vomiting, diarrhea, constipation, change in bowel habits, abdominal pain,  melena, hematochezia, jaundice, gas/bloating, indigestion/heartburn, dysphagia, odynophagia, dysuria, hematuria, urinary frequency, urinary hesitancy, nocturia, incontinence, back pain, joint pain, joint swelling, muscle cramps, muscle weakness, stiffness, arthritis, sciatica, restless legs, leg pain at night, leg pain with exertion, rash, itching, dryness, suspicious lesions, paralysis, paresthesias, seizures, tremors, vertigo, transient blindness, frequent falls, frequent headaches, difficulty walking, depression, memory loss, confusion, cold intolerance, heat intolerance, polydipsia, polyphagia, polyuria, unusual weight change, abnormal bruising, bleeding, enlarged lymph nodes, urticaria, allergic rash, hay fever, and recurrent infections.     Objective:   Physical Exam    WD, WN, 46 y/o  WM in NAD... GENERAL:  Alert & oriented; pleasant & cooperative... HEENT:  [place]/AT, EOM-wnl, Hx retinal vein occlusion rt eye, EACs-clear, TMs-wnl, NOSE-clear, THROAT-clear & wnl. NECK:  Supple w/ full ROM; no JVD; normal carotid impulses w/o bruits; no thyromegaly or nodules palpated; no lymphadenopathy. CHEST:  Clear to P & A; without wheezes/ rales/ or rhonchi. HEART:  Regular Rhythm; without murmurs/ rubs/ or gallops. ABDOMEN:  Soft & nontender; normal bowel sounds; no organomegaly or masses detected. (RECTAL:  Neg - prostate 2+ & nontender w/o nodules; stool hematest neg) EXT: without deformities or arthritic changes; no varicose veins/ venous insuffic/ or edema. NEURO:  CN's intact; motor testing normal; sensory testing normal; gait normal & balance OK. DERM:  No lesions noted; no rash etc...  RADIOLOGY DATA:  Reviewed in the EPIC EMR & discussed w/ the patient...  LABORATORY DATA:  Reviewed in the EPIC EMR & discussed w/ the patient...   Assessment & Plan:    RETINAL VEIN OCCLUSION>  we don't have records from Ophthal; he reports no change in his vision (states right eye's left lower quadrant field  is out, & depth perception is affected); he continues to f/u w/ [name] now...  Hx of PULM NODULE>  CXR no longer shows the LUL nodule, CXR is clear & CT 6/12 confirms resolution of nodule, no residual abn seen;  He remains asymptomatic> no CP, cough, sputum, hemoptysis, SOB, etc;  We will continue to follow his CXRs...  HBP>  He notes BP up when stressed but usually 130s/ 80s at home;  Today BP=140/100 on ToprolXL50 & Losartan100; we reviewed the importance of taking the meds every day!  We decided to add AMLODIPINE 5mg /d...  LIPIDS>  Long hx difficult lipid dilemma;  We stopped both meds & on Diet/ Exercise alone his FLP is worse & LFTs are sl better;  We decided to try ANTARA monotherapy + his best diet efforts w/ f/u in 3-4 months> Pharm confirms that he is not filling Rx regularly;  We reviewed low chol/ low fat diet & wt reduction;  REC- increase Feno back to 160mg /d...  DM>  Follow up labs today showed A1c back up to 8.6 on Metform500Bid; Pharm again confirms irreg refills> discussed diet, exercise, wt reduction, and we will add ONGLYZA5...  LFTs>  likely hepatic steatosis based on CTChest report 3/12 showing diffuse hep steatosis & norm adrenals below the diaph;  Can't rule out contibution from Statin medication (LFT's didn't improve substantially off Prav80 4/12, but they did get worse back on Prev80 6/12 & then improved some off the statin 11/12)... We will need to refer to GI for formal Liver eval & poss bx...  Other medical problems as noted...   Patient's Medications  New Prescriptions   No medications on file  Previous Medications   ASPIRIN 81 MG TABLET    Take 2 tablets daily   CINNAMON 500 MG TABS    Take 3 tablets by mouth daily.     FISH OIL-OMEGA-3 FATTY ACIDS 1000 MG CAPSULE    Take 3 g by mouth daily.     MAGNESIUM OXIDE (MAG-OX) 400 MG TABLET    Take 400 mg by mouth daily.   METOPROLOL SUCCINATE (TOPROL-XL) 50 MG 24 HR TABLET    TAKE 1 TABLET (50 MG TOTAL) BY MOUTH  DAILY.  Modified Medications   Modified Medication Previous Medication   ALPRAZOLAM (XANAX) 0.5 MG TABLET ALPRAZolam (XANAX) 0.5 MG tablet      Take 1/2 to 1  tablet by mouth three times daily as needed    TAKE 1/2 TO 1 TABLET 3 TIMES A DAY AS NEEDED   AMLODIPINE (NORVASC) 5 MG TABLET amLODipine (NORVASC) 5 MG tablet      Take 1 tablet (5 mg total) by mouth daily.    Take 5 mg by mouth daily.   FENOFIBRATE 160 MG TABLET fenofibrate 160 MG tablet      Take 1 tablet (160 mg total) by mouth daily.    Take 160 mg by mouth daily.   LOSARTAN (COZAAR) 100 MG TABLET losartan (COZAAR) 100 MG tablet      Take 1 tablet (100 mg total) by mouth daily.    TAKE 1 TABLET BY MOUTH EVERY DAY   METFORMIN (GLUCOPHAGE) 500 MG TABLET metFORMIN (GLUCOPHAGE) 500 MG tablet      Take 1 tablet (500 mg total) by mouth 2 (two) times daily with a meal.    TAKE 1 TABLET (500 MG TOTAL) BY MOUTH 2 (TWO) TIMES DAILY WITH A MEAL.   PANTOPRAZOLE (PROTONIX) 40 MG TABLET pantoprazole (PROTONIX) 40 MG tablet      Take 1 tablet (40 mg total) by mouth daily.    TAKE 1 TABLET (40 MG TOTAL) BY MOUTH DAILY.   SAXAGLIPTIN HCL (ONGLYZA) 5 MG TABS TABLET saxagliptin HCl (ONGLYZA) 5 MG TABS tablet      Take 1 tablet (5 mg total) by mouth daily.    Take 5 mg by mouth daily.  Discontinued Medications   FENOFIBRATE MICRONIZED (ANTARA) 130 MG CAPSULE    Take 1 capsule (130 mg total) by mouth daily before breakfast.

## 2012-02-26 NOTE — Addendum Note (Signed)
Addended by: Marcellus Scott on: 02/26/2012 02:20 PM   Modules accepted: Orders

## 2012-03-15 ENCOUNTER — Other Ambulatory Visit: Payer: Self-pay | Admitting: Pulmonary Disease

## 2012-03-25 ENCOUNTER — Encounter: Payer: Self-pay | Admitting: Gastroenterology

## 2012-04-08 ENCOUNTER — Other Ambulatory Visit: Payer: Self-pay | Admitting: Pulmonary Disease

## 2012-05-07 ENCOUNTER — Encounter: Payer: Managed Care, Other (non HMO) | Admitting: Gastroenterology

## 2012-06-04 ENCOUNTER — Telehealth: Payer: Self-pay | Admitting: Pulmonary Disease

## 2012-06-04 NOTE — Telephone Encounter (Signed)
ATC patient, no answer LMOMTCB 

## 2012-06-05 MED ORDER — GLIMEPIRIDE 1 MG PO TABS: 1.0000 mg | ORAL_TABLET | Freq: Every day | ORAL | Status: DC

## 2012-06-05 NOTE — Telephone Encounter (Signed)
I spoke with the pt and he states onglyza will be $200. He states his plan covers any generics so he is requesting any generic alternative for the onglyza. Please advise. Carron Curie, CMA Allergies  Allergen Reactions  . Codeine   . Niacin     REACTION: pt states INTOL to Niaspan  . Penicillins   . Rosuvastatin     REACTION: pt states INTOL to Crestor

## 2012-06-05 NOTE — Telephone Encounter (Signed)
Per SN---  1.  On metformin 500 mg bid---take this medication regularly  twice daily 2.  No generic for the onglyza and no others in that class 3.  rec change the glimepiride 1 mg po every morning and take this every day #30  With 5 refills.  Called and spoke with pt and he is aware of SN recs and requested that the glimepiride be sent in to the pharmacy.  This has been done and nothing further is needed.

## 2012-07-01 ENCOUNTER — Ambulatory Visit: Payer: Managed Care, Other (non HMO) | Admitting: Pulmonary Disease

## 2012-07-02 ENCOUNTER — Telehealth: Payer: Self-pay | Admitting: Pulmonary Disease

## 2012-07-02 DIAGNOSIS — D126 Benign neoplasm of colon, unspecified: Secondary | ICD-10-CM

## 2012-07-02 DIAGNOSIS — E119 Type 2 diabetes mellitus without complications: Secondary | ICD-10-CM

## 2012-07-02 DIAGNOSIS — F411 Generalized anxiety disorder: Secondary | ICD-10-CM

## 2012-07-02 DIAGNOSIS — E785 Hyperlipidemia, unspecified: Secondary | ICD-10-CM

## 2012-07-02 DIAGNOSIS — I10 Essential (primary) hypertension: Secondary | ICD-10-CM

## 2012-07-02 NOTE — Telephone Encounter (Signed)
Called and spoke with pt and he is aware that we will follow up with SN on Monday for what labs he may need to do prior to his ov.  Will call pt back on Monday.  SN please advise of labs that pt may need.  Thanks  Allergies  Allergen Reactions  . Codeine   . Niacin     REACTION: pt states INTOL to Niaspan  . Penicillins   . Rosuvastatin     REACTION: pt states INTOL to Crestor

## 2012-07-06 ENCOUNTER — Encounter: Payer: Self-pay | Admitting: Pulmonary Disease

## 2012-07-06 ENCOUNTER — Encounter: Payer: Self-pay | Admitting: *Deleted

## 2012-07-06 NOTE — Telephone Encounter (Signed)
Per SN---ok for pt to come in 1 week prior to OV in may.  Called and spoke with pt and he is aware of labs 1 week prior to may 28 appt with SN.  Pt is aware and nothing further is needed.

## 2012-08-05 ENCOUNTER — Other Ambulatory Visit (INDEPENDENT_AMBULATORY_CARE_PROVIDER_SITE_OTHER): Payer: Managed Care, Other (non HMO)

## 2012-08-05 DIAGNOSIS — E119 Type 2 diabetes mellitus without complications: Secondary | ICD-10-CM

## 2012-08-05 DIAGNOSIS — D126 Benign neoplasm of colon, unspecified: Secondary | ICD-10-CM

## 2012-08-05 DIAGNOSIS — E785 Hyperlipidemia, unspecified: Secondary | ICD-10-CM

## 2012-08-05 DIAGNOSIS — F411 Generalized anxiety disorder: Secondary | ICD-10-CM

## 2012-08-05 DIAGNOSIS — I10 Essential (primary) hypertension: Secondary | ICD-10-CM

## 2012-08-05 LAB — CBC WITH DIFFERENTIAL/PLATELET
Basophils Absolute: 0.1 10*3/uL (ref 0.0–0.1)
Eosinophils Relative: 1.3 % (ref 0.0–5.0)
HCT: 43.9 % (ref 39.0–52.0)
Hemoglobin: 15.1 g/dL (ref 13.0–17.0)
Lymphocytes Relative: 24.2 % (ref 12.0–46.0)
Lymphs Abs: 1.6 10*3/uL (ref 0.7–4.0)
Monocytes Relative: 8.6 % (ref 3.0–12.0)
Neutro Abs: 4.2 10*3/uL (ref 1.4–7.7)
Platelets: 186 10*3/uL (ref 150.0–400.0)
WBC: 6.4 10*3/uL (ref 4.5–10.5)

## 2012-08-05 LAB — HEPATIC FUNCTION PANEL
Albumin: 4.6 g/dL (ref 3.5–5.2)
Total Protein: 8 g/dL (ref 6.0–8.3)

## 2012-08-05 LAB — LIPID PANEL
Cholesterol: 272 mg/dL — ABNORMAL HIGH (ref 0–200)
HDL: 28.6 mg/dL — ABNORMAL LOW (ref >39.00)
VLDL: 62.2 mg/dL — ABNORMAL HIGH (ref 0.0–40.0)

## 2012-08-05 LAB — BASIC METABOLIC PANEL
BUN: 18 mg/dL (ref 6–23)
CO2: 27 mEq/L (ref 19–32)
Calcium: 9.9 mg/dL (ref 8.4–10.5)
GFR: 65.21 mL/min (ref >60.00)
Glucose, Bld: 80 mg/dL (ref 70–99)

## 2012-08-05 LAB — TSH: TSH: 1.21 u[IU]/mL (ref 0.35–5.50)

## 2012-08-05 LAB — LDL CHOLESTEROL, DIRECT: Direct LDL: 184.4 mg/dL

## 2012-08-12 ENCOUNTER — Encounter: Payer: Self-pay | Admitting: Pulmonary Disease

## 2012-08-12 ENCOUNTER — Ambulatory Visit (INDEPENDENT_AMBULATORY_CARE_PROVIDER_SITE_OTHER): Payer: Managed Care, Other (non HMO) | Admitting: Pulmonary Disease

## 2012-08-12 VITALS — BP 138/82 | HR 94 | Temp 99.2°F | Ht 71.0 in | Wt 185.8 lb

## 2012-08-12 DIAGNOSIS — K7689 Other specified diseases of liver: Secondary | ICD-10-CM

## 2012-08-12 DIAGNOSIS — D126 Benign neoplasm of colon, unspecified: Secondary | ICD-10-CM

## 2012-08-12 DIAGNOSIS — K760 Fatty (change of) liver, not elsewhere classified: Secondary | ICD-10-CM

## 2012-08-12 DIAGNOSIS — F411 Generalized anxiety disorder: Secondary | ICD-10-CM

## 2012-08-12 DIAGNOSIS — H349 Unspecified retinal vascular occlusion: Secondary | ICD-10-CM

## 2012-08-12 DIAGNOSIS — I10 Essential (primary) hypertension: Secondary | ICD-10-CM

## 2012-08-12 DIAGNOSIS — E785 Hyperlipidemia, unspecified: Secondary | ICD-10-CM

## 2012-08-12 DIAGNOSIS — E119 Type 2 diabetes mellitus without complications: Secondary | ICD-10-CM

## 2012-08-12 MED ORDER — ATORVASTATIN CALCIUM 10 MG PO TABS: 10.0000 mg | ORAL_TABLET | Freq: Every day | ORAL | Status: DC

## 2012-08-12 MED ORDER — METOPROLOL SUCCINATE ER 100 MG PO TB24: 100.0000 mg | ORAL_TABLET | Freq: Every day | ORAL | Status: DC

## 2012-08-12 NOTE — Progress Notes (Signed)
Subjective:    Patient ID: Carlos Montgomery, male    DOB: 15-Nov-1965, 47 y.o.   MRN: [id number]  HPI 47 y/o WM here for a follow up visit & review of multiple medical issues>  ~  February 05, 2011:  79mo ROV & [name] reports doing very well w/o new complaints or concerns; denies CP, palpit, SOB, cough/phlegm, etc; BP controlled on meds, BS is normal on Metformin, FLP however is worse off statins and LFTs remain elev although improved from 79mo ago...  We discussed diet again & will try Fibrate monotherapy for now (ANTARA 130mg /d), f/u labs, then consider very low dose statin after that;  We reviewed hepatic steatosis & need for very low fat diet> he will also need GI liver eval for completeness...  ~  August 22, 2011:  57mo ROV> [name] says he is doing well w/o new complaints or concerns; his vision is unchanged & he is now seeing [name] & pt reports another laser rx but no diff in his vision which is frustrating for him; he denies resp symptoms and today's CXR is clear, no nodules seen; BP controlled on Metop & Losartan but pt didn't bring list of meds or bottles to review;  FLP sl better on Fenofibrate but not at goals and LFTs still elev from his steatosis & we reviewed diet, exercise etc...    We reviewed prob list, meds, xrays and labs> see below>> CXR 6/13 showed normal heart size, clear lungs, NAD... LABS 6/13:  FLP- not at goals despite meds & Feno160;  Chems- BS=97 A1c=6.7 LFTs elev;  CBC- wnl;  TSH=1.89 I called [facility]- [facility] not regular w/ his Feno130 & last filled #30 on 06/30/11; similar for Metformin500- got #60 on 05/28/11 then #30 picked up 08/19/11...  ~  February 26, 2012:  57mo ROV & [name] says he's doing well but concerned he is here over the holidays & notes that he's gained 10#;  He tells me that he is taking his meds regularly but control is worse (see below) & a call to his Pharm ([facility] [address]) confirms that his is NOT regular w/ his refills; he notes DOE w/ activity & we  discussed poss cardiac eval soon...  We reviewed the following problems for today's office visit>>    Retinal Vein Occlusion> followed by [name]- we do not have recent notes; pt states that vision in right eye is the same, no change...    AB, HxPulmNodule> no resp exac, breathing is good; LUL nodule resolved on CXR/ CT in 2012; last CXR 6/13 was clear...    HBP> on MetopER50 (refilled #30 11/26, 10/19, 9/4) & Losar100 (refilled #30 11/26, 9/4); he says taking daily- BP=142/100 & he denies HA/ CP/ palpit/ SOB/ edema; we decided to add Amlodipine5mg /d...    Hyperlipid> on Antara130 (refilled #30 11/26, 10/19, 7/16); FLP shows TChol 208, TG 326, HDL 27, LDL 144; we reviewed low chol/ low fat diet, wt reduction strategies, and decided to increase Fenofibrate 160mg /d...    DM> on Metform500Bid (refilled #60 11/26, 10/19, 9/4); Labs show BS=132, A1c=8.6; re reviewed low carb, no sweets diet & decided to add Onglyza5mg /d...    GI- GERD, Polyps> on Protonix40; last colonoscopy by drKaplan was 8/08 w/ several adenomatous polyps removed; f/u planned 68yrs & due 8/13- we will call GI...    Abn LFTs, Hepatic steatosis> he has gained 10# to 184# today & LFTs remain abn w/ SGOT=76, SGPT=119; CT Chest 3/12 revealed hep steatosis, he knows to elim  alcohol & the importance of weight reduction...    Anxiety> on Alprazolam 0.5mg  prn... We reviewed prob list, meds, xrays and labs> see below for updates >> he declines the 2013 flu vaccine 7 declines the Pneumovax LABS 12/13:  FLP- not at goals on Feno130;  Chems- BS=132 A1c=8.6 LFTs=abn   ~  Aug 12, 2012:  5-13mo ROV & [name] reports that he's been feeling well overall w/o new complaints or concerns; he tells me that he is unable to eat salads because it gives him diarrhea; also notes that caffeine interacts w/ his Metformin; he states his BPs at home have been elev & as hi as 170/100 in the PMs;     Retinal Vein Occlusion> on ASA81; followed by [name]- we do not have  recent notes; pt states that vision in right eye seems to come & go, he is due for a follow up visit...    AB, HxPulmNodule> no resp exac, breathing is good; LUL nodule resolved on CXR/ CT in 2012; last CXR 6/13 was clear...    HBP> on MetopER50, Amlodipine5, & Losar100; he says taking all 3 daily- BP=138/82 & he denies HA/ CP/ palpit/ SOB/ edema; he notes BP elev at home on occas up to 170/100 & we decided to INCR MetopER100 & take meds regularly!    Hyperlipid> on Antara130 (Feno160 too $$ now) & FishOil; FLP 5/14 shows TChol 272, TG 311, HDL 29, LDL 184; Rec to add ATORVA10 (watch LFTs)- we reviewed low chol/ low fat diet, wt reduction strategies...    DM> on Metform500Bid, Glimep1mg /d, off Ongly5 too $$; Labs 5/14 shows BS=80, A1c=7.8; Rec- continue same & we reviewed low glycemic index, low carb, no sweets diet...    GI- GERD, Polyps> on Protonix40; last colonoscopy by DrKaplan was 8/08 w/ several adenomatous polyps removed; f/u planned 39yrs & due 8/13- we will call GI for f/u...    Abn LFTs, Hepatic steatosis> he has gained 2# to 186# today & LFTs remain abn w/ SGOT=66, SGPT=80 but sl improved serially; CT Chest 3/12 revealed hep steatosis, he knows to elim alcohol & the importance of weight reduction...    Anxiety> on Alprazolam 0.5mg  prn... We reviewed prob list, meds, xrays and labs> see below for updates >>  LABS 5/14:  FLP- not at goals w/ LDL=184 on Antara130;  Chems- ok w/ BS=80 A1c=7.8 Cr=1.3 sl incr GOT/GPT;  CBC- wnl;  TSH=1.21...           Problem List:   RETINAL VEIN OCCLUSION, Right Eye (ICD-362.30) >> SEE ABOVE ~  6/12:  we don't have records from Optometrist or [facility] DrMathews; he reports no change in his vision (states right eye's left lower quadrant field is out, & depth perception is affected)... ~  11/12:  He continues to f/u w/ [facility] & reports no change in his vision (he estimates he can see about 1/2 out of the right eye)... ~  6/13 & 12/13: he tells me he is now seeing  [name] & had another Laser treatment w/o any change in his vision; we do not have Ophthal notes. ~  He maintains regular f/u w/ Ophthalmology & is reminded to request notes to [place] to review...  ASTHMATIC BRONCHITIS, ACUTE (ICD-466.0) - no recent exac and doing well- denies cough, sputum, hemoptysis, worsening dyspnea, wheezing, chest pains, snoring, daytime hypersomnolence, etc... No meds required.  PULMONARY NODULE >> SEE ABOVE ~  3/12:  routine CXR showed showed new nodular opac on left side>>  ~  CTChest 3/12 showed 14mm spiculated density LUL, neg mediastinal adenopathy, no other lesions identified (?tiny density in RML);   ~  PETscan 3/12 showed 13mm LUL nodule w/ low metabolic activ, no hypermetabolic LNs, low metabolic activ in nodular density RML & ground glass area RLL (?infectious/ inflamm etiology);  We decided to wait & recheck in 23mo... ~  6/12:  f/u CXR no longer shows the LUL nodule, CXR is clear;  He remains asymptomatic> no CP, cough, sputum, hemoptysis, SOB, etc;   ~  f/u CTChest 7/12 ==> negative, nodule resolved, no lesions seen, fatty change noted in liver... ~  CXR 6/13 showed normal heart size, clear lungs, no nodules seen...  HYPERTENSION (ICD-401.9) >>  ~  on ASA 81mg /d,  TOPROL XL 50mg /d, & LOSARTAN 100mg /d added 6/12...  ~  6/12:  He notes BP up when stressed but usually 130s/ 80s at home; Today BP=150/90 & we discussed adding LOSARTAN 100mg  to his regimen; he will continue to monitor at home. ~  11/12:  BP= 122/82 & feeling well, asymptomatic- denies HA, fatigue, CP, palipit, dizziness, syncope, dyspnea, edema, etc. ~  6/13:  BP= 142/94 but he notes better at home & didn't take meds today; he remains asymptomatic. ~  12/13:  BP= 140/100 & he says taking meds regularly but Pharm confirms NOT> on MetopER50 (refilled #30 11/26, 10/19, 9/4) & Losar100 (refilled #30 11/26, 9/4); he denies HA/ CP/ palpit/ SOB/ edema; we decided to add Amlodipine5mg /d... ~  5/14: on  MetopER50, Amlodipine5, & Losar100; he says taking all 3 daily- BP=138/82 & he denies HA/ CP/ palpit/ SOB/ edema; he notes BP elev at home on occas up to 170/100 & we decided to INCR MetopER100 & take meds regularly!  HYPERLIPIDEMIA (ICD-272.4) - prev on Pravachol 80mg /d & Fenofibrate 160mg /d... Years ago he was [facility] & INTOL to Crestor & Niaspan... ~  FLP 8/08 ?on Prav40 & Antara130 showed TChol 206, TG 215, HDL 31, LDL 167 ~  FLP 8/09 ?on Prav40 showed TChol 293, TG 280, HDL 36, LDL 188... rec> incr Prav80. ~  FLP 2/11 on Prav80 showed TChol 167, TG 410, HDL 40, LDL 87... rec> add Fenofib160. ~  FLP 3/12 on Prav80+Feno160 showed TChol 211, TG 313, HDL 30, LDL 150... But LFTs abn w/ SGOT=85, SGPT=73; Rec to stop Prav80. ~  FLP 4/12 on Feno160 showed TChol 245, TG 348, HDL 33, LDL 176... LFTs showed SGOT=64, SGPT=79; Sent to [facility] & they stopped Feno160, restarted Prav80. ~  FLP 6/12 on Prav80 showed TChol 211, TG 316, HDL 44, LDL 131... But LFTs worse SGOT=101, SGPT=179; Rec to stop all meds, use diet & exercise for now. ~  FLP 11/12 on diet alone showed TChol 232, TG 314, HDL 33, LDL 170... LFT's improved by still abn; REC to try ANTARA 130mg /d monotherapy (ch to [id number]). ~  FLP 6/13 on Feno130 showed TChol 230, TG 290, HDL 37, LDL 156... Pharm confirms not taking meds regularly. ~  FLP 12/13 on Feno130 showed TChol 208, TG 326, HDL 27, LDL 144... Pharm confirms refills #30 11/26, 10/19, 7/16; Rec to incr to Feno160 + diet... ~  5/14: on Antara130 (Feno160 too $$ now) & FishOil; FLP 5/14 shows TChol 272, TG 311, HDL 29, LDL 184; Rec to add ATORVA10 (watch LFTs)- we reviewed low chol/ low fat diet, wt reduction strategies.  DIABETES MELLITUS >>  ~  on METFORMIN 500mg  Bid + diet rx... ~  3/12:  Newly diagnosed 3/12 CPX  w/ FBS 139 & subseq A1c=8.4; he notes pos FamHx DM in one of his uncles (mother's brother); Rec low carb diet & start Metform500 Bid... ~  6/12:  he has lost 5#  down to 175# (BMI 24-25); Follow up labs today showed BS=110, A1c=7.4; rec to continue same med + diet & exercise... ~  Labs 11/12 on Metform500Bid showed BS= 80, A1c not done... ~  Labs 6/13 on Metform500Bid showed BS=97, A1c=6.7.Marland KitchenMarland Kitchen Improved, continue same... ~  Labs 12/13 on Metform500Bid (refilled #60 11/26, 10/19, 9/4) showed BS=132, A1c=8.6; Rec to take daily & add ONGLYZA5... ~  5/14: on Metform500Bid, Glimep1mg /d, off Ongly5 too $$; Labs 5/14 shows BS=80, A1c=7.8; Rec- continue same & we reviewed low glycemic index, low carb, no sweets diet.  GERD (ICD-530.81) - on PROTONIX 40mg /d... ~  last EGD 9/07 by Dr Arlyce Dice was WNL...  COLONIC POLYPS (ICD-211.3) - colonoscopy 8/08 by Dorris Singh showed several 5mm polyps, otherw neg... bx= adenomatous w/ f/u planned 60yrs. ~  12/13:  He did not receive letter from GI regarding time for f/u colonoscopy; we will refer chart to DrKaplan... ~  5/14: hasn't had f/u colonoscopy yet- we will send reminder to GI...  ABNORMAL LFTs/ HEPATIC STEATOSIS >>  likely hepatic steatosis based on CTChest report 3/12 showing diffuse hep steatosis & norm adrenals below the diaph;  Can't rule out contibution from Statin medication (LFT's didn't improve substantially off Prav80 4/12, but they did get worse back on Prev80 6/12, & subseq improved off the statin 11/12)... He will need GI eval for completeness to r/o other potential caused of incr LFTs... ~  See lab cumulative summary sheets for the numbers...  PEYRONIE'S DISEASE (ICD-607.89) - he saw DrDahlstedt in 1998 for poss early Peyronies...  Hx of NECK PAIN (ICD-723.1) - eval by DrWeymann in 2005 for paresthesias... ?MRI's done- we never received f/u note.  ANXIETY (ICD-300.00) - on ALPRAZOLAM 0.5mg  Prn...  Health Maintenance:   ~  GI:  had colon 8/08 by Dorris Singh w/ 5mm polyp, f/u planned 77yrs... ~  GU:  prostate exam= norm, 2+ no nodules, stool heme neg... ~  Immunizations:  ?last tetanus shot, he refuses Flu  vaccines...   History reviewed. No pertinent past surgical history.   Outpatient Encounter Prescriptions as of 08/12/2012  Medication Sig Dispense Refill  . ALPRAZolam (XANAX) 0.5 MG tablet Take 1/2 to 1 tablet by mouth three times daily as needed  90 tablet  5  . amLODipine (NORVASC) 5 MG tablet Take 1 tablet (5 mg total) by mouth daily.  90 tablet  3  . aspirin 81 MG tablet Take 2 tablets daily      . Cinnamon 500 MG TABS Take 3 tablets by mouth daily.        . fenofibrate 160 MG tablet Take 1 tablet (160 mg total) by mouth daily.  90 tablet  3  . fenofibrate micronized (ANTARA) 130 MG capsule TAKE 1 CAPSULE (130 MG TOTAL) BY MOUTH DAILY BEFORE BREAKFAST.  30 capsule  5  . fish oil-omega-3 fatty acids 1000 MG capsule Take 3 g by mouth daily.        Marland Kitchen glimepiride (AMARYL) 1 MG tablet Take 1 tablet (1 mg total) by mouth daily before breakfast.  30 tablet  11  . losartan (COZAAR) 100 MG tablet Take 1 tablet (100 mg total) by mouth daily.  90 tablet  3  . magnesium oxide (MAG-OX) 400 MG tablet Take 400 mg by mouth daily.      Marland Kitchen  metFORMIN (GLUCOPHAGE) 500 MG tablet Take 1 tablet (500 mg total) by mouth 2 (two) times daily with a meal.  180 tablet  3  . metoprolol succinate (TOPROL-XL) 50 MG 24 hr tablet TAKE 1 TABLET (50 MG TOTAL) BY MOUTH DAILY.  90 tablet  5  . pantoprazole (PROTONIX) 40 MG tablet Take 1 tablet (40 mg total) by mouth daily.  90 tablet  3  . pantoprazole (PROTONIX) 40 MG tablet TAKE 1 TABLET (40 MG TOTAL) BY MOUTH DAILY.  90 tablet  3   No facility-administered encounter medications on file as of 08/12/2012.    Allergies  Allergen Reactions  . Codeine   . Niacin     REACTION: pt states INTOL to Niaspan  . Penicillins   . Rosuvastatin     REACTION: pt states INTOL to Crestor    Current Medications, Allergies, Past Medical History, Past Surgical History, Family History, and Social History were reviewed in [employer] record.    Review of  Systems        The patient complains of blurring, vision loss, and anxiety.  The patient denies fever, chills, sweats, anorexia, fatigue, weakness, malaise, weight loss, sleep disorder, diplopia, eye irritation, eye discharge, eye pain, photophobia, earache, ear discharge, tinnitus, decreased hearing, nasal congestion, nosebleeds, sore throat, hoarseness, chest pain, palpitations, syncope, dyspnea on exertion, orthopnea, PND, peripheral edema, cough, dyspnea at rest, excessive sputum, hemoptysis, wheezing, pleurisy, nausea, vomiting, diarrhea, constipation, change in bowel habits, abdominal pain, melena, hematochezia, jaundice, gas/bloating, indigestion/heartburn, dysphagia, odynophagia, dysuria, hematuria, urinary frequency, urinary hesitancy, nocturia, incontinence, back pain, joint pain, joint swelling, muscle cramps, muscle weakness, stiffness, arthritis, sciatica, restless legs, leg pain at night, leg pain with exertion, rash, itching, dryness, suspicious lesions, paralysis, paresthesias, seizures, tremors, vertigo, transient blindness, frequent falls, frequent headaches, difficulty walking, depression, memory loss, confusion, cold intolerance, heat intolerance, polydipsia, polyphagia, polyuria, unusual weight change, abnormal bruising, bleeding, enlarged lymph nodes, urticaria, allergic rash, hay fever, and recurrent infections.     Objective:   Physical Exam    WD, WN, 47 y/o WM in NAD... GENERAL:  Alert & oriented; pleasant & cooperative... HEENT:  [place]/AT, EOM-wnl, Hx retinal vein occlusion rt eye, EACs-clear, TMs-wnl, NOSE-clear, THROAT-clear & wnl. NECK:  Supple w/ full ROM; no JVD; normal carotid impulses w/o bruits; no thyromegaly or nodules palpated; no lymphadenopathy. CHEST:  Clear to P & A; without wheezes/ rales/ or rhonchi. HEART:  Regular Rhythm; without murmurs/ rubs/ or gallops. ABDOMEN:  Soft & nontender; normal bowel sounds; no organomegaly or masses detected. (RECTAL:  Neg -  prostate 2+ & nontender w/o nodules; stool hematest neg) EXT: without deformities or arthritic changes; no varicose veins/ venous insuffic/ or edema. NEURO:  CN's intact; motor testing normal; sensory testing normal; gait normal & balance OK. DERM:  No lesions noted; no rash etc...  RADIOLOGY DATA:  Reviewed in the EPIC EMR & discussed w/ the patient...  LABORATORY DATA:  Reviewed in the EPIC EMR & discussed w/ the patient...   Assessment & Plan:    RETINAL VEIN OCCLUSION>  we don't have records from Ophthal; he reports wax & wane pattern to his his vision (states right eye's left lower quadrant field is out, & depth perception is affected); he continues to f/u w/ [name] now...  Hx of PULM NODULE>  CXR no longer shows the LUL nodule, CXR is clear & CT 6/12 confirms resolution of nodule, no residual abn seen;  He remains asymptomatic> no CP, cough, sputum, hemoptysis,  SOB, etc;  We will continue to follow his CXRs...  HBP>  He notes BP elev at home despite 3 meds- aske to be sure to take them every day!  We decided to incr the MetopER to 100mg /d 7 keep the Losartan100 & Amlodip5 the same for now...  LIPIDS>  Long hx difficult lipid dilemma;  We stopped both meds & on Diet/ Exercise alone his FLP was worse & LFTs were sl better;  We decided to try ANTARA monotherapy + his best diet efforts> Pharm confirmed that he is not filling Rx regularly;  We reviewed low chol/ low fat diet & wt reduction;  Feno160 has become too expensive;  FLP 5/14 is way off & we rec adding ATORVA10 w/ careful f/u labs...  DM>  Follow up labs today showed A1c improved to 7.8 on Metform500Bid & Glimep1; continue same + diet, get wt down...  LFTs>  likely hepatic steatosis based on CTChest report 3/12 showing diffuse hep steatosis & norm adrenals below the diaph;  Can't rule out contibution from Statin medication (LFT's didn't improve substantially off Prav80 4/12, but they did get worse back on Prev80 6/12 & then  improved some off the statin 11/12)... Continue to observe...  Other medical problems as noted...   Patient's Medications  New Prescriptions   ATORVASTATIN (LIPITOR) 10 MG TABLET    Take 1 tablet (10 mg total) by mouth daily.   METOPROLOL SUCCINATE (TOPROL-XL) 100 MG 24 HR TABLET    Take 1 tablet (100 mg total) by mouth daily. Take with or immediately following a meal.  Previous Medications   ALPRAZOLAM (XANAX) 0.5 MG TABLET    Take 1/2 to 1 tablet by mouth three times daily as needed   AMLODIPINE (NORVASC) 5 MG TABLET    Take 1 tablet (5 mg total) by mouth daily.   ASPIRIN 81 MG TABLET    Take 2 tablets daily   CINNAMON 500 MG TABS    Take 3 tablets by mouth daily.     FENOFIBRATE MICRONIZED (ANTARA) 130 MG CAPSULE    TAKE 1 CAPSULE (130 MG TOTAL) BY MOUTH DAILY BEFORE BREAKFAST.   FISH OIL-OMEGA-3 FATTY ACIDS 1000 MG CAPSULE    Take 3 g by mouth daily.     GLIMEPIRIDE (AMARYL) 1 MG TABLET    Take 1 tablet (1 mg total) by mouth daily before breakfast.   LOSARTAN (COZAAR) 100 MG TABLET    Take 1 tablet (100 mg total) by mouth daily.   MAGNESIUM OXIDE (MAG-OX) 400 MG TABLET    Take 400 mg by mouth daily.   METFORMIN (GLUCOPHAGE) 500 MG TABLET    Take 1 tablet (500 mg total) by mouth 2 (two) times daily with a meal.   PANTOPRAZOLE (PROTONIX) 40 MG TABLET    Take 1 tablet (40 mg total) by mouth daily.  Modified Medications   No medications on file  Discontinued Medications   FENOFIBRATE 160 MG TABLET    Take 1 tablet (160 mg total) by mouth daily.   METOPROLOL SUCCINATE (TOPROL-XL) 50 MG 24 HR TABLET    TAKE 1 TABLET (50 MG TOTAL) BY MOUTH DAILY.   PANTOPRAZOLE (PROTONIX) 40 MG TABLET    TAKE 1 TABLET (40 MG TOTAL) BY MOUTH DAILY.

## 2012-08-12 NOTE — Patient Instructions (Addendum)
Today we updated your med list in our EPIC system...  For your BP- we need sl better control>>    Let's increase the METOPROLOLER from 50mg /d to 100mg /d    Continue the LOSARTAN100 & AMLODIPINE5...    For your DM>>    Continue the Metformin500mg  twice daily & the Glimepiride1mg  in the AM...    Be sure to embrace the LOW CARB diet (look up foods w/ a low GLYCEMIC INDEX)...    Work on weight reduction to help this and your fatty liver disease...  For your Lipids>>    Let's continue the Antara (Fennofibrate 130) as the most reasonably priced alternative...    Add a low dose statin- let's try generic ATORVASTATIN 10mg  daily...  Call for any questions...  Let's plan a brief follow up w/ FASTING labs in about 3 months.Marland KitchenMarland Kitchen

## 2012-08-18 ENCOUNTER — Other Ambulatory Visit: Payer: Self-pay | Admitting: Pulmonary Disease

## 2012-08-18 DIAGNOSIS — D126 Benign neoplasm of colon, unspecified: Secondary | ICD-10-CM

## 2012-08-18 DIAGNOSIS — K760 Fatty (change of) liver, not elsewhere classified: Secondary | ICD-10-CM

## 2012-08-26 ENCOUNTER — Other Ambulatory Visit: Payer: Self-pay | Admitting: Pulmonary Disease

## 2012-09-07 ENCOUNTER — Other Ambulatory Visit: Payer: Self-pay | Admitting: Pulmonary Disease

## 2012-10-16 ENCOUNTER — Other Ambulatory Visit: Payer: Self-pay | Admitting: Pulmonary Disease

## 2012-10-27 ENCOUNTER — Other Ambulatory Visit: Payer: Self-pay | Admitting: Pulmonary Disease

## 2012-11-03 ENCOUNTER — Encounter: Payer: Self-pay | Admitting: Gastroenterology

## 2012-11-11 ENCOUNTER — Ambulatory Visit: Payer: Managed Care, Other (non HMO) | Admitting: Pulmonary Disease

## 2012-11-14 IMAGING — CR DG CHEST 2V
2 series · 2 of 2 positions shown · non-contrast
Comparison: May 23, 2010 and PET CT dated June 11, 2010

CLINICAL DATA: Follow-up chest x-ray nodule

CHEST - 2 VIEW

[view not recorded (1 of 2)]
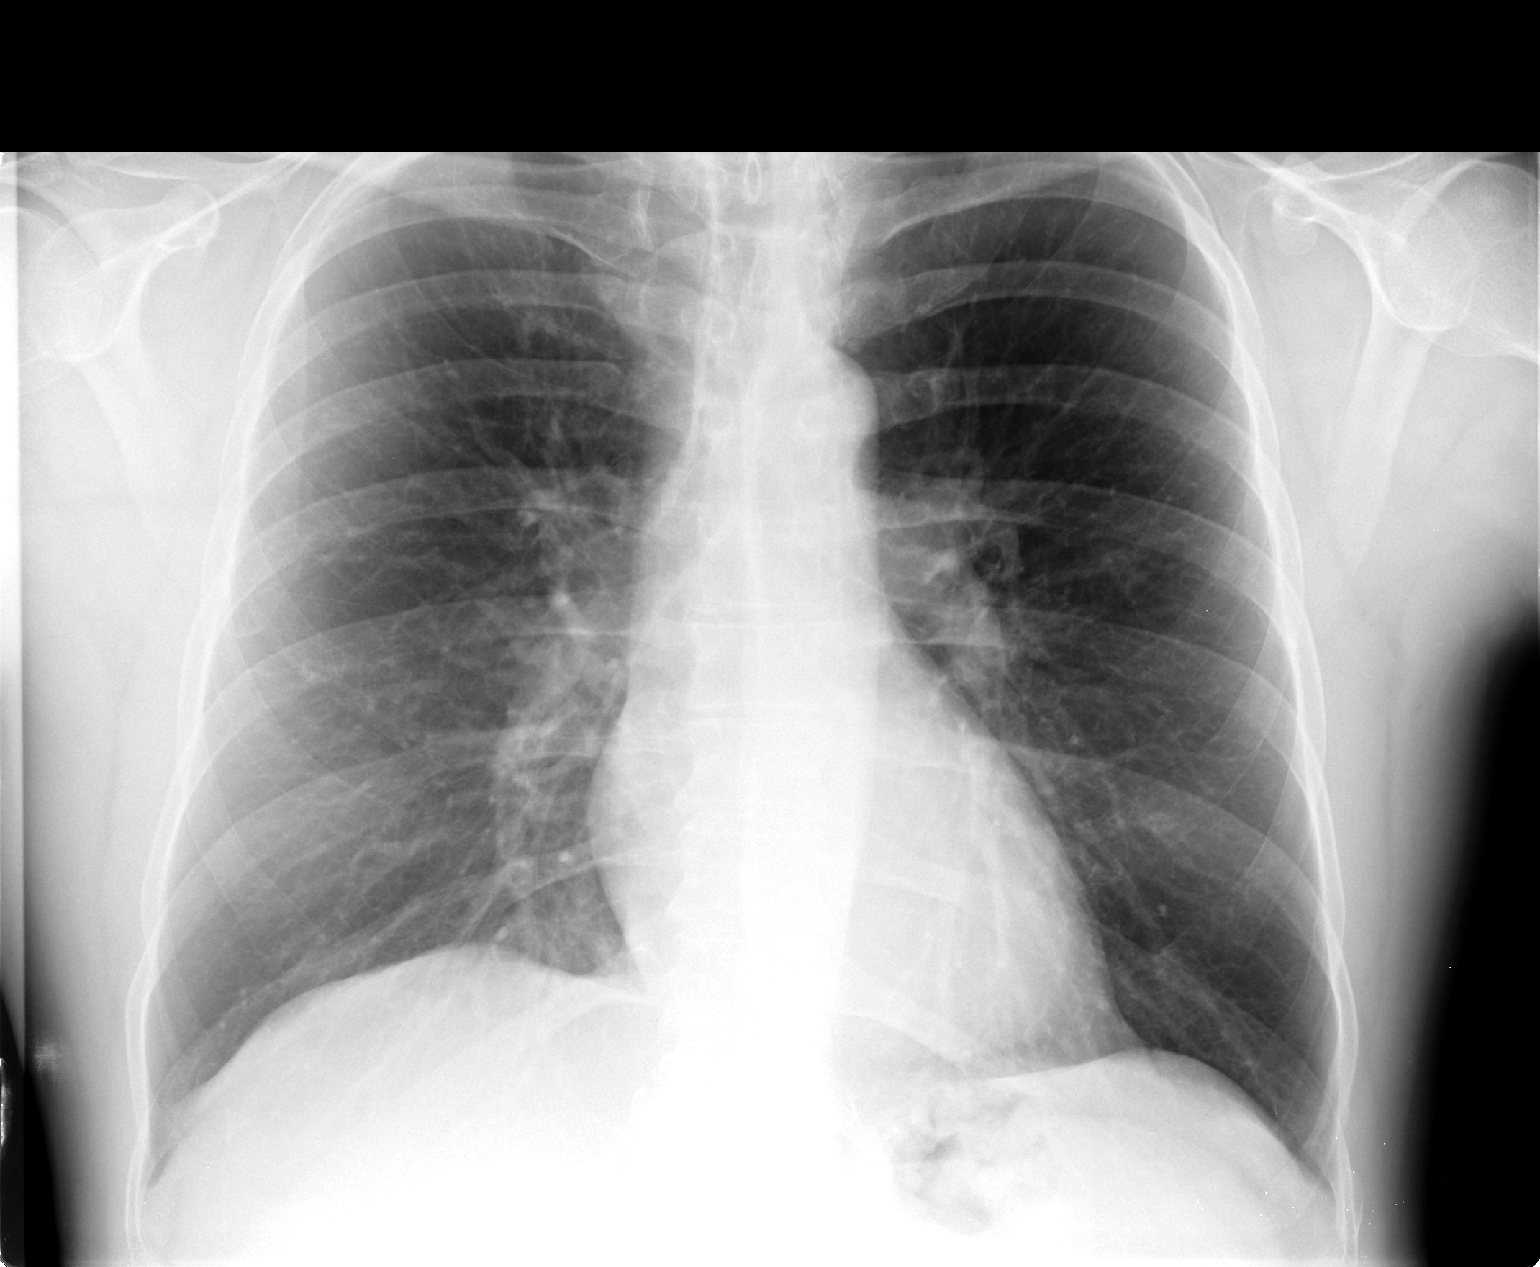

[view not recorded (2 of 2)]
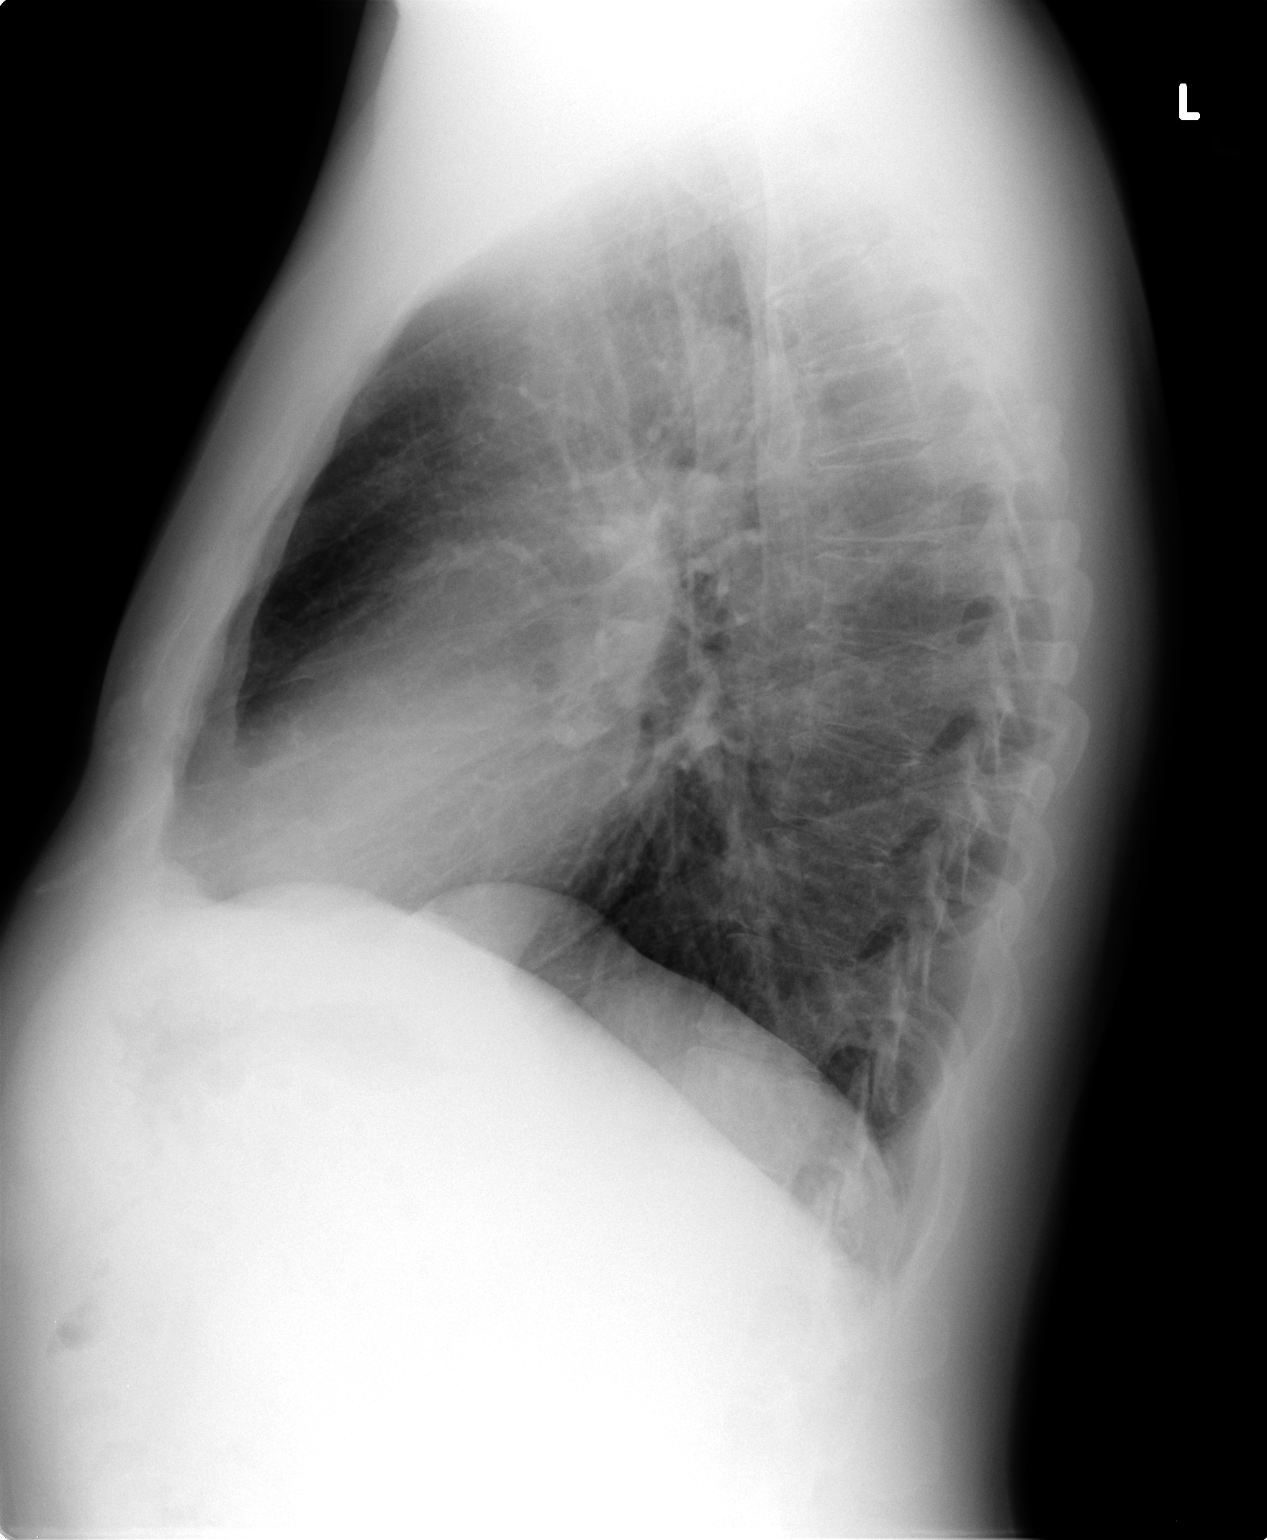

[2 of 2 positions shown; findings below may reference images not displayed]

FINDINGS: The cardiac silhouette, mediastinum, pulmonary
vasculature are within normal limits.  The previously seen small
nodule in the left upper lobe is not seen on plain film today but
was much better seen with CT scan previously.  No infiltrates or
effusions are identified.
IMPRESSION: Normal chest x-ray.

The previously identified small nodule in the left upper lobe is
not appreciated today but was much better seen with CT scan before.
Consider follow-up with CT scan for more definitive
characterization.

## 2012-12-24 ENCOUNTER — Ambulatory Visit (INDEPENDENT_AMBULATORY_CARE_PROVIDER_SITE_OTHER): Payer: Managed Care, Other (non HMO)

## 2012-12-24 ENCOUNTER — Other Ambulatory Visit: Payer: Self-pay | Admitting: Pulmonary Disease

## 2012-12-24 DIAGNOSIS — E785 Hyperlipidemia, unspecified: Secondary | ICD-10-CM

## 2012-12-24 DIAGNOSIS — E119 Type 2 diabetes mellitus without complications: Secondary | ICD-10-CM

## 2012-12-24 DIAGNOSIS — R7989 Other specified abnormal findings of blood chemistry: Secondary | ICD-10-CM

## 2012-12-24 LAB — HEPATIC FUNCTION PANEL
AST: 105 U/L — ABNORMAL HIGH (ref 0–37)
Albumin: 4.9 g/dL (ref 3.5–5.2)
Alkaline Phosphatase: 25 U/L — ABNORMAL LOW (ref 39–117)
Bilirubin, Direct: 0.2 mg/dL (ref 0.0–0.3)
Total Bilirubin: 1 mg/dL (ref 0.3–1.2)

## 2012-12-24 LAB — BASIC METABOLIC PANEL
GFR: 74.49 mL/min (ref >60.00)
Glucose, Bld: 126 mg/dL — ABNORMAL HIGH (ref 70–99)
Potassium: 5 mEq/L (ref 3.5–5.1)
Sodium: 139 mEq/L (ref 135–145)

## 2012-12-24 LAB — LIPID PANEL
HDL: 30.3 mg/dL — ABNORMAL LOW (ref >39.00)
Total CHOL/HDL Ratio: 10

## 2012-12-25 LAB — LDL CHOLESTEROL, DIRECT: Direct LDL: 204.1 mg/dL

## 2012-12-28 ENCOUNTER — Telehealth: Payer: Self-pay | Admitting: Pulmonary Disease

## 2012-12-28 ENCOUNTER — Ambulatory Visit (INDEPENDENT_AMBULATORY_CARE_PROVIDER_SITE_OTHER): Payer: Managed Care, Other (non HMO) | Admitting: Pulmonary Disease

## 2012-12-28 ENCOUNTER — Encounter: Payer: Self-pay | Admitting: Pulmonary Disease

## 2012-12-28 VITALS — BP 144/100 | HR 82 | Temp 98.2°F | Ht 71.0 in | Wt 185.0 lb

## 2012-12-28 DIAGNOSIS — D126 Benign neoplasm of colon, unspecified: Secondary | ICD-10-CM

## 2012-12-28 DIAGNOSIS — K7689 Other specified diseases of liver: Secondary | ICD-10-CM

## 2012-12-28 DIAGNOSIS — E785 Hyperlipidemia, unspecified: Secondary | ICD-10-CM

## 2012-12-28 DIAGNOSIS — F411 Generalized anxiety disorder: Secondary | ICD-10-CM

## 2012-12-28 DIAGNOSIS — E119 Type 2 diabetes mellitus without complications: Secondary | ICD-10-CM

## 2012-12-28 DIAGNOSIS — R7989 Other specified abnormal findings of blood chemistry: Secondary | ICD-10-CM

## 2012-12-28 DIAGNOSIS — K219 Gastro-esophageal reflux disease without esophagitis: Secondary | ICD-10-CM

## 2012-12-28 DIAGNOSIS — H349 Unspecified retinal vascular occlusion: Secondary | ICD-10-CM

## 2012-12-28 DIAGNOSIS — K760 Fatty (change of) liver, not elsewhere classified: Secondary | ICD-10-CM

## 2012-12-28 DIAGNOSIS — I10 Essential (primary) hypertension: Secondary | ICD-10-CM

## 2012-12-28 MED ORDER — AMLODIPINE BESYLATE 10 MG PO TABS: 10.0000 mg | ORAL_TABLET | Freq: Every day | ORAL | Status: DC

## 2012-12-28 NOTE — Progress Notes (Signed)
Subjective:    Patient ID: Carlos Montgomery, male    DOB: December 11, 1965, 47 y.o.   MRN: [id number]  HPI 47 y/o WM here for a follow up visit & review of multiple medical issues>  ~  August 22, 2011:  54mo ROV> Bradford says he is doing well w/o new complaints or concerns; his vision is unchanged & he is now seeing [name] & pt reports another laser rx but no diff in his vision which is frustrating for him; he denies resp symptoms and today's CXR is clear, no nodules seen; BP controlled on Metop & Losartan but pt didn't bring list of meds or bottles to review;  FLP sl better on Fenofibrate but not at goals and LFTs still elev from his steatosis & we reviewed diet, exercise etc...    We reviewed prob list, meds, xrays and labs> see below>> CXR 6/13 showed normal heart size, clear lungs, NAD... LABS 6/13:  FLP- not at goals despite meds & Feno160;  Chems- BS=97 A1c=6.7 LFTs elev;  CBC- wnl;  TSH=1.89 I called [facility]- [facility] not regular w/ his Feno130 & last filled #30 on 06/30/11; similar for Metformin500- got #60 on 05/28/11 then #30 picked up 08/19/11...  ~  February 26, 2012:  54mo ROV & Carlos Montgomery says he's doing well but concerned he is here over the holidays & notes that he's gained 10#;  He tells me that he is taking his meds regularly but control is worse (see below) & a call to his Pharm ([facility] [address]) confirms that his is NOT regular w/ his refills; he notes DOE w/ activity & we discussed poss cardiac eval soon...  We reviewed the following problems for today's office visit>>    Retinal Vein Occlusion> followed by [name]- we do not have recent notes; pt states that vision in right eye is the same, no change...    AB, HxPulmNodule> no resp exac, breathing is good; LUL nodule resolved on CXR/ CT in 2012; last CXR 6/13 was clear...    HBP> on MetopER50 (refilled #30 11/26, 10/19, 9/4) & Losar100 (refilled #30 11/26, 9/4); he says taking daily- BP=142/100 & he denies HA/ CP/ palpit/ SOB/ edema; we  decided to add Amlodipine5mg /d...    Hyperlipid> on Antara130 (refilled #30 11/26, 10/19, 7/16); FLP shows TChol 208, TG 326, HDL 27, LDL 144; we reviewed low chol/ low fat diet, wt reduction strategies, and decided to increase Fenofibrate 160mg /d...    DM> on Metform500Bid (refilled #60 11/26, 10/19, 9/4); Labs show BS=132, A1c=8.6; re reviewed low carb, no sweets diet & decided to add Onglyza5mg /d...    GI- GERD, Polyps> on Protonix40; last colonoscopy by drKaplan was 8/08 w/ several adenomatous polyps removed; f/u planned 63yrs & due 8/13- we will call GI...    Abn LFTs, Hepatic steatosis> he has gained 10# to 184# today & LFTs remain abn w/ SGOT=76, SGPT=119; CT Chest 3/12 revealed hep steatosis, he knows to elim alcohol & the importance of weight reduction...    Anxiety> on Alprazolam 0.5mg  prn... We reviewed prob list, meds, xrays and labs> see below for updates >> he declines the 2013 flu vaccine 7 declines the Pneumovax LABS 12/13:  FLP- not at goals on Feno130;  Chems- BS=132 A1c=8.6 LFTs=abn   ~  Aug 12, 2012:  5-77mo ROV & Carlos Montgomery reports that he's been feeling well overall w/o new complaints or concerns; he tells me that he is unable to eat salads because it gives him diarrhea; also notes that caffeine  interacts w/ his Metformin; he states his BPs at home have been elev & as hi as 170/100 in the PMs;     Retinal Vein Occlusion> on ASA81; followed by [name]- we do not have recent notes; pt states that vision in right eye seems to come & go, he is due for a follow up visit...    AB, HxPulmNodule> no resp exac, breathing is good; LUL nodule resolved on CXR/ CT in 2012; last CXR 6/13 was clear...    HBP> on MetopER50, Amlodipine5, & Losar100; he says taking all 3 daily- BP=138/82 & he denies HA/ CP/ palpit/ SOB/ edema; he notes BP elev at home on occas up to 170/100 & we decided to INCR MetopER100 & take meds regularly!    Hyperlipid> on Antara130 because Feno160 is too $$ now, & FishOil; FLP  5/14 shows TChol 272, TG 311, HDL 29, LDL 184; Rec to add ATORVA10 (watch LFTs)- we reviewed low chol/ low fat diet, wt reduction strategies...    DM> on Metform500Bid, Glimep1mg /d, off Ongly5 too $$; Labs 5/14 shows BS=80, A1c=7.8; Rec- continue same & we reviewed low glycemic index, low carb, no sweets diet...    GI- GERD, Polyps> on Protonix40; last colonoscopy by DrKaplan was 8/08 w/ several adenomatous polyps removed; f/u planned 28yrs & due 8/13- we will call GI for f/u...    Abn LFTs, Hepatic steatosis> he has gained 2# to 186# today & LFTs remain abn w/ SGOT=66, SGPT=80 but sl improved serially; CT Chest 3/12 revealed hep steatosis, he knows to elim alcohol & the importance of weight reduction...    Anxiety> on Alprazolam 0.5mg  prn... We reviewed prob list, meds, xrays and labs> see below for updates >>  LABS 5/14:  FLP- not at goals w/ LDL=184 on Antara130;  Chems- ok w/ BS=80 A1c=7.8 Cr=1.3 sl incr GOT/GPT;  CBC- wnl;  TSH=1.21...  ~  December 28, 2012:  4-51mo ROV & Carlos Montgomery has no new complaints or concerns today; however I checked w/ his Pharm before todays OV & he has been somewhat irreg w/ his meds- getting 30d supplies & freq missing 1-2wks betw refills (eg- Metop & Losar filled 8/24, then 10/6) and he hasn't refilled the Lipitor since 7/5 (#30 at that time)... We reviewed the following medical problems during today's office visit >>     Retinal Vein Occlusion> on ASA81; followed by [name]- but we still do not have notes; pt tells me he had some kind of retinal surg ~1/14 w/ improved vision for about 815mo, now back to baseline & he'd like 2nd opinion at [facility]...    AB, HxPulmNodule> no resp exac, breathing is good; LUL nodule resolved on CXR/ CT in 2012; last CXR 6/13 was clear; he denies resp symptoms...    HBP> on MetopER100, Amlodipine5, & Losar100; he says taking all 3 daily- BP=144/100 today but he says 140/80 at home; denies HA/ CP/ palpit/ SOB/ edema; we decided to incr AMLODIPINE10  & take meds every day!    Hyperlipid> on Lip10, Antara130 (Feno160 too $$) & FishOil; FLP 10/14 showed TChol 294, TG 390, HDL30, LDL 204; I told him we needed Lipid Consult at [facility], he wants to lose 20# first, Rec to take meds every day, add ZETIA10 samples & ROV 15mo.    DM> on Metform500Bid, Glimep1mg /d, off Ongly5 too $$; Labs 10/14 shows BS=126, A1c=6.7; Rec- continue same, get wt down, we reviewed low glycemic index, low carb, no sweets diet...    GI- GERD, Polyps>  on Protonix40; last colonoscopy by DrKaplan was 8/08 w/ several adenomatous polyps removed; f/u planned 31yrs & due 8/13- he is overdue for f/u colon but did not return GI phone calls to sched...    Abn LFTs, Hepatic steatosis> weight is stable at 185# today & LFTs remain abn w/ SGOT=105, SGPT=104 (likely due to steatosis + Lipitor); CT Chest 3/12 revealed hep steatosis, he knows to elim alcohol & the importance of weight reduction...    Anxiety> on Alprazolam 0.5mg  prn... We reviewed prob list, meds, xrays and labs> see below for updates >> he refuses the FLU vaccine. LABS 10/14:  FLP- not at goals & worse despite meds;  Chems- ok w/ BS=126 A1c=6.7 AST=105 ALT=104...           Problem List:   RETINAL VEIN OCCLUSION, Right Eye (ICD-362.30) >>  ~  6/12:  we don't have records from Optometrist or [facility] DrMathews; he reports no change in his vision (states right eye's left lower quadrant field is out, & depth perception is affected)... ~  11/12:  He continues to f/u w/ [facility] & reports no change in his vision (he estimates he can see about 1/2 out of the right eye)... ~  6/13 & 12/13: he tells me he is now seeing [name] & had another Laser treatment w/o any change in his vision; we do not have Ophthal notes. ~  He maintains regular f/u w/ Ophthalmology & is reminded to request notes to [place] to review... ~  10/14: on ASA81; followed by [name]- but we still do not have notes; pt tells me he had some kind of retinal surg ~1/14 w/  improved vision for about 17mo, now back to baseline & he'd like 2nd opinion at [facility]  ASTHMATIC BRONCHITIS, ACUTE (ICD-466.0) - no recent exac and doing well- denies cough, sputum, hemoptysis, worsening dyspnea, wheezing, chest pains, snoring, daytime hypersomnolence, etc... No meds required.  Hx PULMONARY NODULE ?ETIOLOGY >>  ~  3/12:  routine CXR showed showed new nodular opac on left side>>  ~  CTChest 3/12 showed 14mm spiculated density LUL, neg mediastinal adenopathy, no other lesions identified (?tiny density in RML);   ~  PETscan 3/12 showed 13mm LUL nodule w/ low metabolic activ, no hypermetabolic LNs, low metabolic activ in nodular density RML & ground glass area RLL (?infectious/ inflamm etiology);  We decided to wait & recheck in 59mo... ~  6/12:  f/u CXR no longer shows the LUL nodule, CXR is clear;  He remains asymptomatic> no CP, cough, sputum, hemoptysis, SOB, etc;   ~  f/u CTChest 7/12 ==> negative, nodule resolved, no lesions seen, fatty change noted in liver... ~  CXR 6/13 showed normal heart size, clear lungs, no nodules seen...  HYPERTENSION (ICD-401.9) >>  ~  on ASA 81mg /d,  TOPROL XL 50mg /d, & LOSARTAN 100mg /d added 6/12...  ~  6/12:  He notes BP up when stressed but usually 130s/ 80s at home; Today BP=150/90 & we discussed adding LOSARTAN 100mg  to his regimen; he will continue to monitor at home. ~  11/12:  BP= 122/82 & feeling well, asymptomatic- denies HA, fatigue, CP, palipit, dizziness, syncope, dyspnea, edema, etc. ~  6/13:  BP= 142/94 but he notes better at home & didn't take meds today; he remains asymptomatic. ~  12/13:  BP= 140/100 & he says taking meds regularly but Pharm confirms NOT> on MetopER50 (refilled #30 11/26, 10/19, 9/4) & Losar100 (refilled #30 11/26, 9/4); he denies HA/ CP/ palpit/ SOB/ edema; we  decided to add Amlodipine5mg /d... ~  5/14: on MetopER50, Amlodipine5, & Losar100; he says taking all 3 daily- BP=138/82 & he denies HA/ CP/ palpit/ SOB/  edema; he notes BP elev at home on occas up to 170/100 & we decided to [facility] MetopER100 & take meds regularly! ~  10/14: on MetopER100, Amlodipine5, & Losar100; he says taking all 3 daily- BP=144/100 today but he says 140/80 at home; denies HA/ CP/ palpit/ SOB/ edema; we decided to incr AMLODIPINE10 & take meds every day!  HYPERLIPIDEMIA (ICD-272.4) >>  ~  prev on Pravachol 80mg /d & Fenofibrate 160mg /d... Years ago he was followed [facility] & INTOL to Crestor & Niaspan... ~  FLP 8/08 ?on Prav40 & Antara130 showed TChol 206, TG 215, HDL 31, LDL 167 ~  FLP 8/09 ?on Prav40 showed TChol 293, TG 280, HDL 36, LDL 188... rec> incr Prav80. ~  FLP 2/11 on Prav80 showed TChol 167, TG 410, HDL 40, LDL 87... rec> add Fenofib160. ~  FLP 3/12 on Prav80+Feno160 showed TChol 211, TG 313, HDL 30, LDL 150... But LFTs abn w/ SGOT=85, SGPT=73; Rec to stop Prav80. ~  FLP 4/12 on Feno160 showed TChol 245, TG 348, HDL 33, LDL 176... LFTs showed SGOT=64, SGPT=79; [facility] to [facility] & they stopped Feno160, restarted Prav80. ~  FLP 6/12 on Prav80 showed TChol 211, TG 316, HDL 44, LDL 131... But LFTs worse SGOT=101, SGPT=179; Rec to stop all meds, use diet & exercise for now. ~  FLP 11/12 on diet alone showed TChol 232, TG 314, HDL 33, LDL 170... LFT's improved by still abn; REC to try ANTARA 130mg /d monotherapy (ch to [id number]). ~  FLP 6/13 on Feno130 showed TChol 230, TG 290, HDL 37, LDL 156... Pharm confirms not taking meds regularly. ~  FLP 12/13 on Feno130 showed TChol 208, TG 326, HDL 27, LDL 144... Pharm confirms refills #30 11/26, 10/19, 7/16; Rec to incr to Feno160 + diet... ~  5/14: on Antara130 (Feno160 too $$ now) & FishOil; FLP 5/14 shows TChol 272, TG 311, HDL 29, LDL 184; Rec to add ATORVA10 (watch LFTs)- we reviewed low chol/ low fat diet, wt reduction strategies. ~  10/14: on Lip10 (Pharm says no refill since 7/5), Antara130, FishOil; FLP 10/14 showed TChol 294, TG 390, HDL30, LDL 204; I told him we needed  Lipid Consult at [facility], he wants to lose 20# first, Rec to take meds every day, add ZETIA10 samples & ROV 33mo  DIABETES MELLITUS >>  ~  on METFORMIN 500mg  Bid + diet rx... ~  3/12:  Newly diagnosed 3/12 CPX w/ FBS 139 & subseq A1c=8.4; he notes pos FamHx DM in one of his uncles (mother's brother); Rec low carb diet & start Metform500 Bid... ~  6/12:  he has lost 5# down to 175# (BMI 24-25); Follow up labs today showed BS=110, A1c=7.4; rec to continue same med + diet & exercise... ~  Labs 11/12 on Metform500Bid showed BS= 80, A1c not done... ~  Labs 6/13 on Metform500Bid showed BS=97, A1c=6.7.Marland KitchenMarland Kitchen Improved, continue same... ~  Labs 12/13 on Metform500Bid (refilled #60 11/26, 10/19, 9/4) showed BS=132, A1c=8.6; Rec to take daily & add ONGLYZA5... ~  5/14: on Metform500Bid, Glimep1mg /d, off Ongly5 too $$; Labs 5/14 shows BS=80, A1c=7.8; Rec- continue same & we reviewed low glycemic index, low carb, no sweets diet. ~  10/14: on Metform500Bid, Glimep1mg /d; Labs 10/14 shows BS=126, A1c=6.7; Rec- continue same, get wt down, we reviewed low glycemic index, low carb, no sweets diet.  GERD (ICD-530.81) - on PROTONIX 40mg /d... ~  last EGD 9/07 by Dr Arlyce Dice was WNL...  COLONIC POLYPS (ICD-211.3) - colonoscopy 8/08 by Dorris Singh showed several 5mm polyps, otherw neg... bx= adenomatous w/ f/u planned 75yrs. ~  12/13:  He did not receive letter from GI regarding time for f/u colonoscopy; we will refer chart to DrKaplan... ~  5/14: hasn't had f/u colonoscopy yet- we sent reminder to GI, pt did not return calls... ~  10/14: reminded to call to sched his f/u colonoscopy...  ABNORMAL LFTs/ HEPATIC STEATOSIS >>  likely hepatic steatosis based on CTChest report 3/12 showing diffuse hep steatosis & norm adrenals below the diaph;  Can't rule out contibution from Statin medication (LFT's didn't improve substantially off Prav80 4/12, but they did get worse back on Prev80 6/12, & subseq improved off the statin 11/12)... He  will need GI eval for completeness to r/o other potential caused of incr LFTs... ~  See lab cumulative summary sheets for the numbers...  PEYRONIE'S DISEASE (ICD-607.89) - he saw DrDahlstedt in 1998 for poss early Peyronies...  Hx of NECK PAIN (ICD-723.1) - eval by DrWeymann in 2005 for paresthesias... ?MRI's done- we never received f/u note.  ANXIETY (ICD-300.00) - on ALPRAZOLAM 0.5mg  Prn...  Health Maintenance:   ~  GI:  had colon 8/08 by Dorris Singh w/ 5mm polyp, f/u planned 93yrs... ~  GU:  prostate exam= norm, 2+ no nodules, stool heme neg... ~  Immunizations:  ?last tetanus shot, he refuses Flu vaccines...   History reviewed. No pertinent past surgical history.   Outpatient Encounter Prescriptions as of 12/28/2012  Medication Sig Dispense Refill  . ALPRAZolam (XANAX) 0.5 MG tablet TAKE 1/2 TO 1 TABLET BY MOUTH 3 TIMES A DAY AS NEEDED  90 tablet  5  . amLODipine (NORVASC) 5 MG tablet Take 1 tablet (5 mg total) by mouth daily.  90 tablet  3  . aspirin 81 MG tablet Take 2 tablets daily      . atorvastatin (LIPITOR) 10 MG tablet Take 1 tablet (10 mg total) by mouth daily.  30 tablet  11  . Cinnamon 500 MG TABS Take 3 tablets by mouth daily.        . fenofibrate micronized (ANTARA) 130 MG capsule TAKE 1 CAPSULE (130 MG TOTAL) BY MOUTH DAILY BEFORE BREAKFAST.  30 capsule  5  . fish oil-omega-3 fatty acids 1000 MG capsule Take 3 g by mouth daily.        Marland Kitchen glimepiride (AMARYL) 1 MG tablet Take 1 tablet (1 mg total) by mouth daily before breakfast.  30 tablet  11  . losartan (COZAAR) 100 MG tablet TAKE 1 TABLET BY MOUTH EVERY DAY  30 tablet  11  . magnesium oxide (MAG-OX) 400 MG tablet Take 400 mg by mouth daily.      . metFORMIN (GLUCOPHAGE) 500 MG tablet TAKE 1 TABLET TWICE A DAY WITH MEALS  60 tablet  8  . metoprolol succinate (TOPROL-XL) 100 MG 24 hr tablet Take 1 tablet (100 mg total) by mouth daily. Take with or immediately following a meal.  30 tablet  11  . pantoprazole (PROTONIX) 40  MG tablet Take 1 tablet (40 mg total) by mouth daily.  90 tablet  3  . [DISCONTINUED] fenofibrate micronized (ANTARA) 130 MG capsule TAKE 1 CAPSULE (130 MG TOTAL) BY MOUTH DAILY BEFORE BREAKFAST.  30 capsule  5  . [DISCONTINUED] losartan (COZAAR) 100 MG tablet Take 1 tablet (100 mg total) by mouth daily.  90  tablet  3   No facility-administered encounter medications on file as of 12/28/2012.    Allergies  Allergen Reactions  . Codeine   . Niacin     REACTION: pt states INTOL to Niaspan  . Penicillins   . Rosuvastatin     REACTION: pt states INTOL to Crestor    Current Medications, Allergies, Past Medical History, Past Surgical History, Family History, and Social History were reviewed in [employer] record.    Review of Systems        The patient complains of blurring, vision loss, and anxiety.  The patient denies fever, chills, sweats, anorexia, fatigue, weakness, malaise, weight loss, sleep disorder, diplopia, eye irritation, eye discharge, eye pain, photophobia, earache, ear discharge, tinnitus, decreased hearing, nasal congestion, nosebleeds, sore throat, hoarseness, chest pain, palpitations, syncope, dyspnea on exertion, orthopnea, PND, peripheral edema, cough, dyspnea at rest, excessive sputum, hemoptysis, wheezing, pleurisy, nausea, vomiting, diarrhea, constipation, change in bowel habits, abdominal pain, melena, hematochezia, jaundice, gas/bloating, indigestion/heartburn, dysphagia, odynophagia, dysuria, hematuria, urinary frequency, urinary hesitancy, nocturia, incontinence, back pain, joint pain, joint swelling, muscle cramps, muscle weakness, stiffness, arthritis, sciatica, restless legs, leg pain at night, leg pain with exertion, rash, itching, dryness, suspicious lesions, paralysis, paresthesias, seizures, tremors, vertigo, transient blindness, frequent falls, frequent headaches, difficulty walking, depression, memory loss, confusion, cold intolerance, heat  intolerance, polydipsia, polyphagia, polyuria, unusual weight change, abnormal bruising, bleeding, enlarged lymph nodes, urticaria, allergic rash, hay fever, and recurrent infections.     Objective:   Physical Exam    WD, WN, 47 y/o WM in NAD... GENERAL:  Alert & oriented; pleasant & cooperative... HEENT:  [place]/AT, EOM-wnl, Hx retinal vein occlusion rt eye, EACs-clear, TMs-wnl, NOSE-clear, THROAT-clear & wnl. NECK:  Supple w/ full ROM; no JVD; normal carotid impulses w/o bruits; no thyromegaly or nodules palpated; no lymphadenopathy. CHEST:  Clear to P & A; without wheezes/ rales/ or rhonchi. HEART:  Regular Rhythm; without murmurs/ rubs/ or gallops. ABDOMEN:  Soft & nontender; normal bowel sounds; no organomegaly or masses detected.   (RECTAL:  Neg - prostate 2+ & nontender w/o nodules; stool hematest neg) EXT: without deformities or arthritic changes; no varicose veins/ venous insuffic/ or edema. NEURO:  CN's intact; motor testing normal; sensory testing normal; gait normal & balance OK. DERM:  No lesions noted; no rash etc...  RADIOLOGY DATA:  Reviewed in the EPIC EMR & discussed w/ the patient...  LABORATORY DATA:  Reviewed in the EPIC EMR & discussed w/ the patient...   Assessment & Plan:    RETINAL VEIN OCCLUSION>  we don't have records from Ophthal- [name]; pt reports operation on right eye ~1/14 w/ improved vision for ~43mo then back to prev baseline; he requests 2nd opinion at [facility], retina specialist...  Hx of PULM NODULE>  CXR no longer shows the LUL nodule, CXR 6/13 is clear & CT 6/12 confirms resolution of nodule, no residual abn seen;  He remains asymptomatic> no CP, cough, sputum, hemoptysis, SOB, etc;  We will continue to follow his CXRs...  HBP>  He notes BP elev at home despite 3 meds- aske to be sure to take them every day!  We decided to incr Amlodip to 10mg  & continue the Metop100 & Losar100...  LIPIDS>  Long hx difficult lipid dilemma;  We stopped  both meds & on Diet/ Exercise alone his FLP was worse & LFTs were sl better;  We decided to try ANTARA monotherapy + his best diet efforts> Pharm confirmed that he is  not filling Rx regularly;  We reviewed low chol/ low fat diet & wt reduction;  Feno160 has become too expensive;  FLP 5/14 & 10/14 are way off & we rec added ATORVA10 but he wasn't taking that regularly either....  DM>  Follow up labs today showed A1c improved to 6.7 on Metform500Bid & Glimep1; continue same + diet, get wt down...  LFTs>  likely hepatic steatosis based on CTChest report 3/12 showing diffuse hep steatosis & norm adrenals below the diaph;  Can't rule out contibution from Statin medication (LFT's didn't improve substantially off Prav80 4/12, but they did get worse back on Prev80 6/12 & then improved some off the statin 11/12)... Follow up is hindered by irreg dosing & he is again asked to take meds every day for proper assessment of efficacy & tolerability... Try Lip10, Feno130, Add ZETIA10 samples...  Other medical problems as noted...   Patient's Medications  New Prescriptions   AMLODIPINE (NORVASC) 10 MG TABLET    Take 1 tablet (10 mg total) by mouth daily.  Previous Medications   ALPRAZOLAM (XANAX) 0.5 MG TABLET    TAKE 1/2 TO 1 TABLET BY MOUTH 3 TIMES A DAY AS NEEDED   ASPIRIN 81 MG TABLET    Take 2 tablets daily   ATORVASTATIN (LIPITOR) 10 MG TABLET    Take 1 tablet (10 mg total) by mouth daily.   CINNAMON 500 MG TABS    Take 3 tablets by mouth daily.     EZETIMIBE (ZETIA) 10 MG TABLET    Take 10 mg by mouth daily.  (SAMPLES GIVEN)   FENOFIBRATE MICRONIZED (ANTARA) 130 MG CAPSULE    TAKE 1 CAPSULE (130 MG TOTAL) BY MOUTH DAILY BEFORE BREAKFAST.   FISH OIL-OMEGA-3 FATTY ACIDS 1000 MG CAPSULE    Take 3 g by mouth daily.     GLIMEPIRIDE (AMARYL) 1 MG TABLET    Take 1 tablet (1 mg total) by mouth daily before breakfast.   LOSARTAN (COZAAR) 100 MG TABLET    TAKE 1 TABLET BY MOUTH EVERY DAY   MAGNESIUM OXIDE (MAG-OX)  400 MG TABLET    Take 400 mg by mouth daily.   METFORMIN (GLUCOPHAGE) 500 MG TABLET    TAKE 1 TABLET TWICE A DAY WITH MEALS   METOPROLOL SUCCINATE (TOPROL-XL) 100 MG 24 HR TABLET    Take 1 tablet (100 mg total) by mouth daily. Take with or immediately following a meal.   PANTOPRAZOLE (PROTONIX) 40 MG TABLET    Take 1 tablet (40 mg total) by mouth daily.  Modified Medications   No medications on file  Discontinued Medications   AMLODIPINE (NORVASC) 5 MG TABLET    Take 1 tablet (5 mg total) by mouth daily.

## 2012-12-28 NOTE — Patient Instructions (Signed)
Today we updated your med list in our EPIC system...    We decided to aim for tighter BP control>     Increase the AMLODIPINE to 10mg  daily...    Continue the Metoprolol & Losartan the same...    Be sure to take all your meds daily...  Your DM labs are improved on diet/ exercise & your Metformin500mg  twice daily + the glimepiride 1mg  each AM...    Continue the same 7 take your meds regularly...  For the Lipids> we really need some help...    Continue the Lipitor10mg  daily & the ANTARA130mg  daily...    Continue your low chol/ low fat diet & lose the 20# your promised to lose!!!    We decided to add ZETIA 10mg  one tab daily 7 gave you some samples...        (I will save up some further samples to make it to our 27mo ROV- call me before you run out)...  Lastly we will set up the referral to [facility]...  Call for any questions...  Let's plan a follow up visit in 27mo w/ labs prior to that visit

## 2012-12-30 ENCOUNTER — Telehealth: Payer: Self-pay | Admitting: Pulmonary Disease

## 2012-12-30 NOTE — Telephone Encounter (Signed)
dawne spoke to pt and gave him info for his [facility] appt Tobe Sos

## 2013-01-04 NOTE — Telephone Encounter (Signed)
Please advise thanks.

## 2013-01-04 NOTE — Telephone Encounter (Signed)
Labs are in the computer for the pt.  Nothing further is needed.

## 2013-01-19 ENCOUNTER — Encounter: Payer: Self-pay | Admitting: Gastroenterology

## 2013-01-28 ENCOUNTER — Ambulatory Visit (AMBULATORY_SURGERY_CENTER): Payer: Self-pay

## 2013-01-28 VITALS — Ht 70.0 in | Wt 175.0 lb

## 2013-01-28 DIAGNOSIS — Z860100 Personal history of colon polyps, unspecified: Secondary | ICD-10-CM

## 2013-01-28 MED ORDER — SUPREP BOWEL PREP KIT 17.5-3.13-1.6 GM/177ML PO SOLN: 1.0000 | Freq: Once | ORAL | Status: DC

## 2013-02-02 ENCOUNTER — Encounter: Payer: Self-pay | Admitting: Gastroenterology

## 2013-02-08 ENCOUNTER — Ambulatory Visit (AMBULATORY_SURGERY_CENTER): Payer: Managed Care, Other (non HMO) | Admitting: Gastroenterology

## 2013-02-08 ENCOUNTER — Encounter: Payer: Self-pay | Admitting: Gastroenterology

## 2013-02-08 VITALS — BP 140/91 | HR 80 | Temp 97.7°F | Resp 24 | Ht 70.0 in | Wt 175.0 lb

## 2013-02-08 DIAGNOSIS — Z86010 Personal history of colonic polyps: Secondary | ICD-10-CM

## 2013-02-08 DIAGNOSIS — K5730 Diverticulosis of large intestine without perforation or abscess without bleeding: Secondary | ICD-10-CM

## 2013-02-08 MED ORDER — SODIUM CHLORIDE 0.9 % IV SOLN: 500.0000 mL | INTRAVENOUS | Status: DC

## 2013-02-08 NOTE — Patient Instructions (Signed)
Diverticulosis seen today, see handout. Repeat colonoscopy in 10 years. Resume current medications. Call us with any questions or concerns. Thank you!!  YOU HAD AN ENDOSCOPIC PROCEDURE TODAY AT THE [facility]: Refer to the procedure report that was given to you for any specific questions about what was found during the examination.  If the procedure report does not answer your questions, please call your gastroenterologist to clarify.  If you requested that your care partner not be given the details of your procedure findings, then the procedure report has been included in a sealed envelope for you to review at your convenience later.  YOU SHOULD EXPECT: Some feelings of bloating in the abdomen. Passage of more gas than usual.  Walking can help get rid of the air that was put into your GI tract during the procedure and reduce the bloating. If you had a lower endoscopy (such as a colonoscopy or flexible sigmoidoscopy) you may notice spotting of blood in your stool or on the toilet paper. If you underwent a bowel prep for your procedure, then you may not have a normal bowel movement for a few days.  DIET: Your first meal following the procedure should be a light meal and then it is ok to progress to your normal diet.  A half-sandwich or bowl of soup is an example of a good first meal.  Heavy or fried foods are harder to digest and may make you feel nauseous or bloated.  Likewise meals heavy in dairy and vegetables can cause extra gas to form and this can also increase the bloating.  Drink plenty of fluids but you should avoid alcoholic beverages for 24 hours.  ACTIVITY: Your care partner should take you home directly after the procedure.  You should plan to take it easy, moving slowly for the rest of the day.  You can resume normal activity the day after the procedure however you should NOT DRIVE or use heavy machinery for 24 hours (because of the sedation medicines used during the test).     SYMPTOMS TO REPORT IMMEDIATELY: A gastroenterologist can be reached at any hour.  During normal business hours, 8:30 AM to 5:00 PM Monday through Friday, call [phone].  After hours and on weekends, please call the GI answering service at [phone] who will take a message and have the physician on call contact you.   Following lower endoscopy (colonoscopy or flexible sigmoidoscopy):  Excessive amounts of blood in the stool  Significant tenderness or worsening of abdominal pains  Swelling of the abdomen that is new, acute  Fever of 100F or higher  Following upper endoscopy (EGD)  Vomiting of blood or coffee ground material  New chest pain or pain under the shoulder blades  Painful or persistently difficult swallowing  New shortness of breath  Fever of 100F or higher  Black, tarry-looking stools  FOLLOW UP: If any biopsies were taken you will be contacted by phone or by letter within the next 1-3 weeks.  Call your gastroenterologist if you have not heard about the biopsies in 3 weeks.  Our staff will call the home number listed on your records the next business day following your procedure to check on you and address any questions or concerns that you may have at that time regarding the information given to you following your procedure. This is a courtesy call and so if there is no answer at the home number and we have not heard from you through the emergency  physician on call, we will assume that you have returned to your regular daily activities without incident.  SIGNATURES/CONFIDENTIALITY: You and/or your care partner have signed paperwork which will be entered into your electronic medical record.  These signatures attest to the fact that that the information above on your After Visit Summary has been reviewed and is understood.  Full responsibility of the confidentiality of this discharge information lies with you and/or your care-partner.

## 2013-02-08 NOTE — Op Note (Signed)
[facility] [address] [place], [address]   COLONOSCOPY PROCEDURE REPORT  PATIENT: Carlos Montgomery, Carlos Montgomery  MR#: [id number] BIRTHDATE: September 06, 1965 , 47  yrs. old GENDER: Male ENDOSCOPIST: Louis Meckel, MD REFERRED BY: PROCEDURE DATE:  02/08/2013 PROCEDURE:   Colonoscopy, diagnostic First Screening Colonoscopy - Avg.  risk and is 50 yrs.  old or older - No.  Prior Negative Screening - Now for repeat screening. N/A  History of Adenoma - Now for follow-up colonoscopy & has been > or = to 3 yrs.  Yes hx of adenoma.  Has been 3 or more years since last colonoscopy.  Polyps Removed Today? No.  Recommend repeat exam, <10 yrs? No. ASA CLASS:   Class II INDICATIONS:Patient's personal history of colon polyps. 2008 MEDICATIONS: MAC sedation, administered by CRNA and propofol (Diprivan) 200mg  IV  DESCRIPTION OF PROCEDURE:   After the risks benefits and alternatives of the procedure were thoroughly explained, informed consent was obtained.  A digital rectal exam revealed no abnormalities of the rectum.   The LB [id number]  endoscope was introduced through the anus and advanced to the cecum, which was identified by both the appendix and ileocecal valve. No adverse events experienced.   The quality of the prep was Suprep good  The instrument was then slowly withdrawn as the colon was fully examined.      COLON FINDINGS: Mild diverticulosis was noted in the ascending colon.   The colon was otherwise normal.  There was no diverticulosis, inflammation, polyps or cancers unless previously stated.  Retroflexed views revealed no abnormalities. The time to cecum=6 minutes 20 seconds.  Withdrawal time=8 minutes 42 seconds. The scope was withdrawn and the procedure completed. COMPLICATIONS: There were no complications.  ENDOSCOPIC IMPRESSION: 1.   Mild diverticulosis was noted in the ascending colon 2.   The colon was otherwise normal  RECOMMENDATIONS: Colonoscopy 10  years   eSigned:  Louis Meckel, MD 02/08/2013 4:51 PM   cc: Michele Mcalpine, MD and Zelphia Cairo MD   PATIENT NAME:  [name], [name] MR#: [id number]

## 2013-02-08 NOTE — Progress Notes (Signed)
Patient did not experience any of the following events: a burn prior to discharge; a fall within the facility; wrong site/side/patient/procedure/implant event; or a hospital transfer or hospital admission upon discharge from the facility. (G8907) Patient did not have preoperative order for IV antibiotic SSI prophylaxis. (G8918)  

## 2013-02-08 NOTE — Progress Notes (Signed)
  [facility] Anesthesia Post-op Note  Patient: Carlos Montgomery  Procedure(s) Performed: colonoscopy  Patient Location: [facility] - Recovery Area  Anesthesia Type: Deep Sedation/Propofol  Level of Consciousness: awake, oriented and patient cooperative  Airway and Oxygen Therapy: Patient Spontanous Breathing  Post-op Pain: none  Post-op Assessment:  Post-op Vital signs reviewed, Patient's Cardiovascular Status Stable, Respiratory Function Stable, Patent Airway, No signs of Nausea or vomiting and Pain level controlled  Post-op Vital Signs: Reviewed and stable  Complications: No apparent anesthesia complications  Devynn Hessler E 4:53 PM

## 2013-02-09 ENCOUNTER — Telehealth: Payer: Self-pay

## 2013-02-09 NOTE — Telephone Encounter (Signed)
  Follow up Call-  Call back number 02/08/2013  Post procedure Call Back phone  # [phone]  Permission to leave phone message Yes     Patient questions:  Do you have a fever, pain , or abdominal swelling? no Pain Score  0 *  Have you tolerated food without any problems? yes  Have you been able to return to your normal activities? yes  Do you have any questions about your discharge instructions: Diet   no Medications  no Follow up visit  no  Do you have questions or concerns about your Care? no  Actions: * If pain score is 4 or above: No action needed, pain <4.

## 2013-03-19 ENCOUNTER — Other Ambulatory Visit: Payer: Self-pay | Admitting: Pulmonary Disease

## 2013-03-30 ENCOUNTER — Other Ambulatory Visit: Payer: Self-pay | Admitting: Pulmonary Disease

## 2013-03-31 NOTE — Telephone Encounter (Signed)
Called refill to pharmacy voicemail.  

## 2013-04-02 ENCOUNTER — Other Ambulatory Visit: Payer: Self-pay | Admitting: Pulmonary Disease

## 2013-04-28 ENCOUNTER — Ambulatory Visit: Payer: Managed Care, Other (non HMO) | Admitting: Pulmonary Disease

## 2013-05-03 ENCOUNTER — Other Ambulatory Visit: Payer: Self-pay | Admitting: Pulmonary Disease

## 2013-05-06 ENCOUNTER — Other Ambulatory Visit: Payer: Self-pay | Admitting: Pulmonary Disease

## 2013-05-14 ENCOUNTER — Telehealth: Payer: Self-pay | Admitting: Pulmonary Disease

## 2013-05-14 DIAGNOSIS — E785 Hyperlipidemia, unspecified: Secondary | ICD-10-CM

## 2013-05-14 DIAGNOSIS — E119 Type 2 diabetes mellitus without complications: Secondary | ICD-10-CM

## 2013-05-14 DIAGNOSIS — I10 Essential (primary) hypertension: Secondary | ICD-10-CM

## 2013-05-14 NOTE — Telephone Encounter (Signed)
Please advise where pt can be worked in at? thanks 

## 2013-05-17 NOTE — Telephone Encounter (Signed)
Called, spoke with pt.  Explained below to pt.  He verbalized understanding and would like to proceed with OV with TP.  This has been scheduled for March 11 at 11:30 am -- pt aware.  Pt states he is due for labs at this appt and would like to have this done prior to appt so results can be discussed.  Pls advise on labs pt needs.  Thank you.  Pt also reports he will need refills on some of he medications prior to OV with TP but is unsure of what meds are needed.  He will check on this and let us know.  Regarding new PCP, pt states he does have a new PCP in mind.  States he will call that office later on after he sees TP to schedule an appt.

## 2013-05-17 NOTE — Telephone Encounter (Signed)
SN is booked solid this month.  No openings.  We can get the pt in to see TP but the pt will need to be set up with new primary care doctor.  SN will no longer be able to do primary care after April 1.  thanks

## 2013-05-18 MED ORDER — LOSARTAN POTASSIUM 100 MG PO TABS: ORAL_TABLET | ORAL | Status: DC

## 2013-05-18 MED ORDER — GLIMEPIRIDE 1 MG PO TABS: 1.0000 mg | ORAL_TABLET | Freq: Every day | ORAL | Status: DC

## 2013-05-18 MED ORDER — METOPROLOL SUCCINATE ER 100 MG PO TB24: 100.0000 mg | ORAL_TABLET | Freq: Every day | ORAL | Status: DC

## 2013-05-18 MED ORDER — METFORMIN HCL 500 MG PO TABS: ORAL_TABLET | ORAL | Status: DC

## 2013-05-18 NOTE — Telephone Encounter (Signed)
Spoke with pt - Per SN - Meds refilled to [facility] on [address] - Lab orders entered prior to appt - PT will see TP on 05/26/13 at 11:30

## 2013-05-26 ENCOUNTER — Ambulatory Visit: Payer: Managed Care, Other (non HMO) | Admitting: Adult Health

## 2013-05-26 ENCOUNTER — Encounter: Payer: Self-pay | Admitting: Pulmonary Disease

## 2013-10-28 ENCOUNTER — Other Ambulatory Visit: Payer: Self-pay | Admitting: Pulmonary Disease

## 2013-10-30 IMAGING — CR DG CHEST 2V
2 series · 2 of 2 positions shown · non-contrast
Comparison: 09/06/2010

CLINICAL DATA: Retention, ex-smoker

CHEST - 2 VIEW

[view not recorded (1 of 2)]
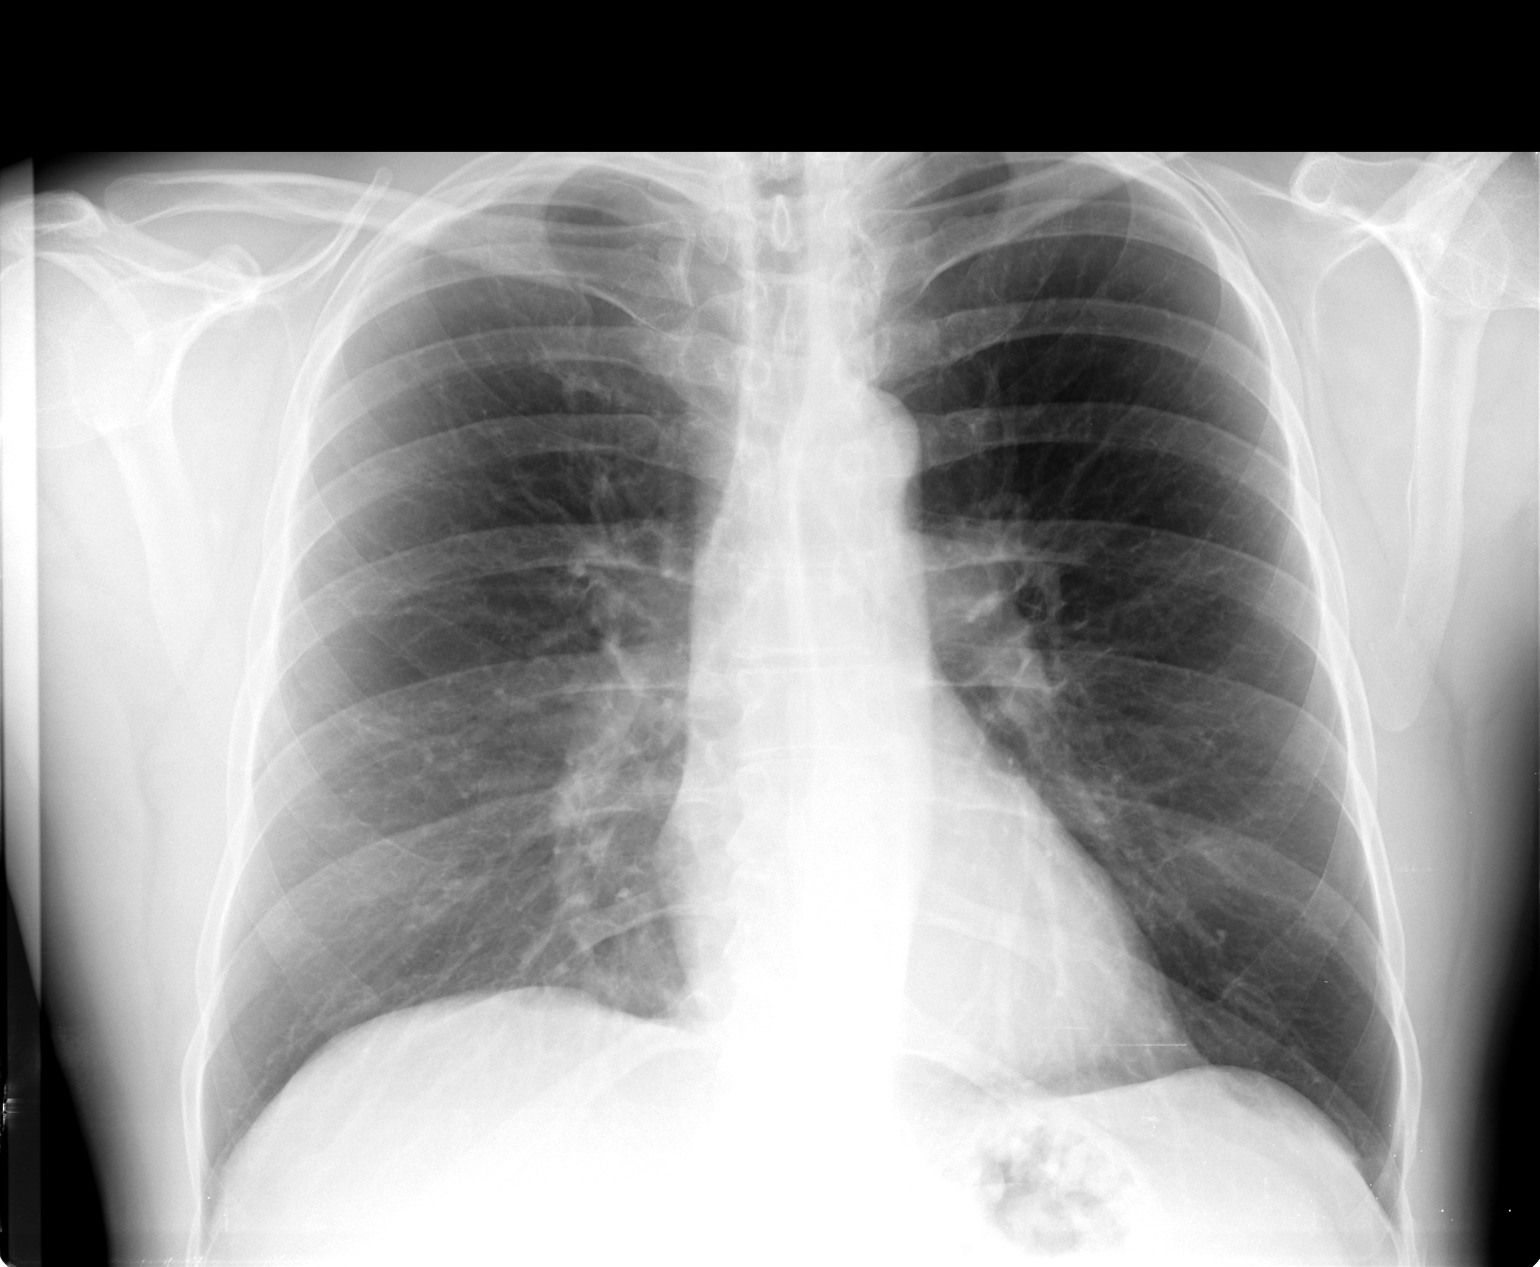

[view not recorded (2 of 2)]
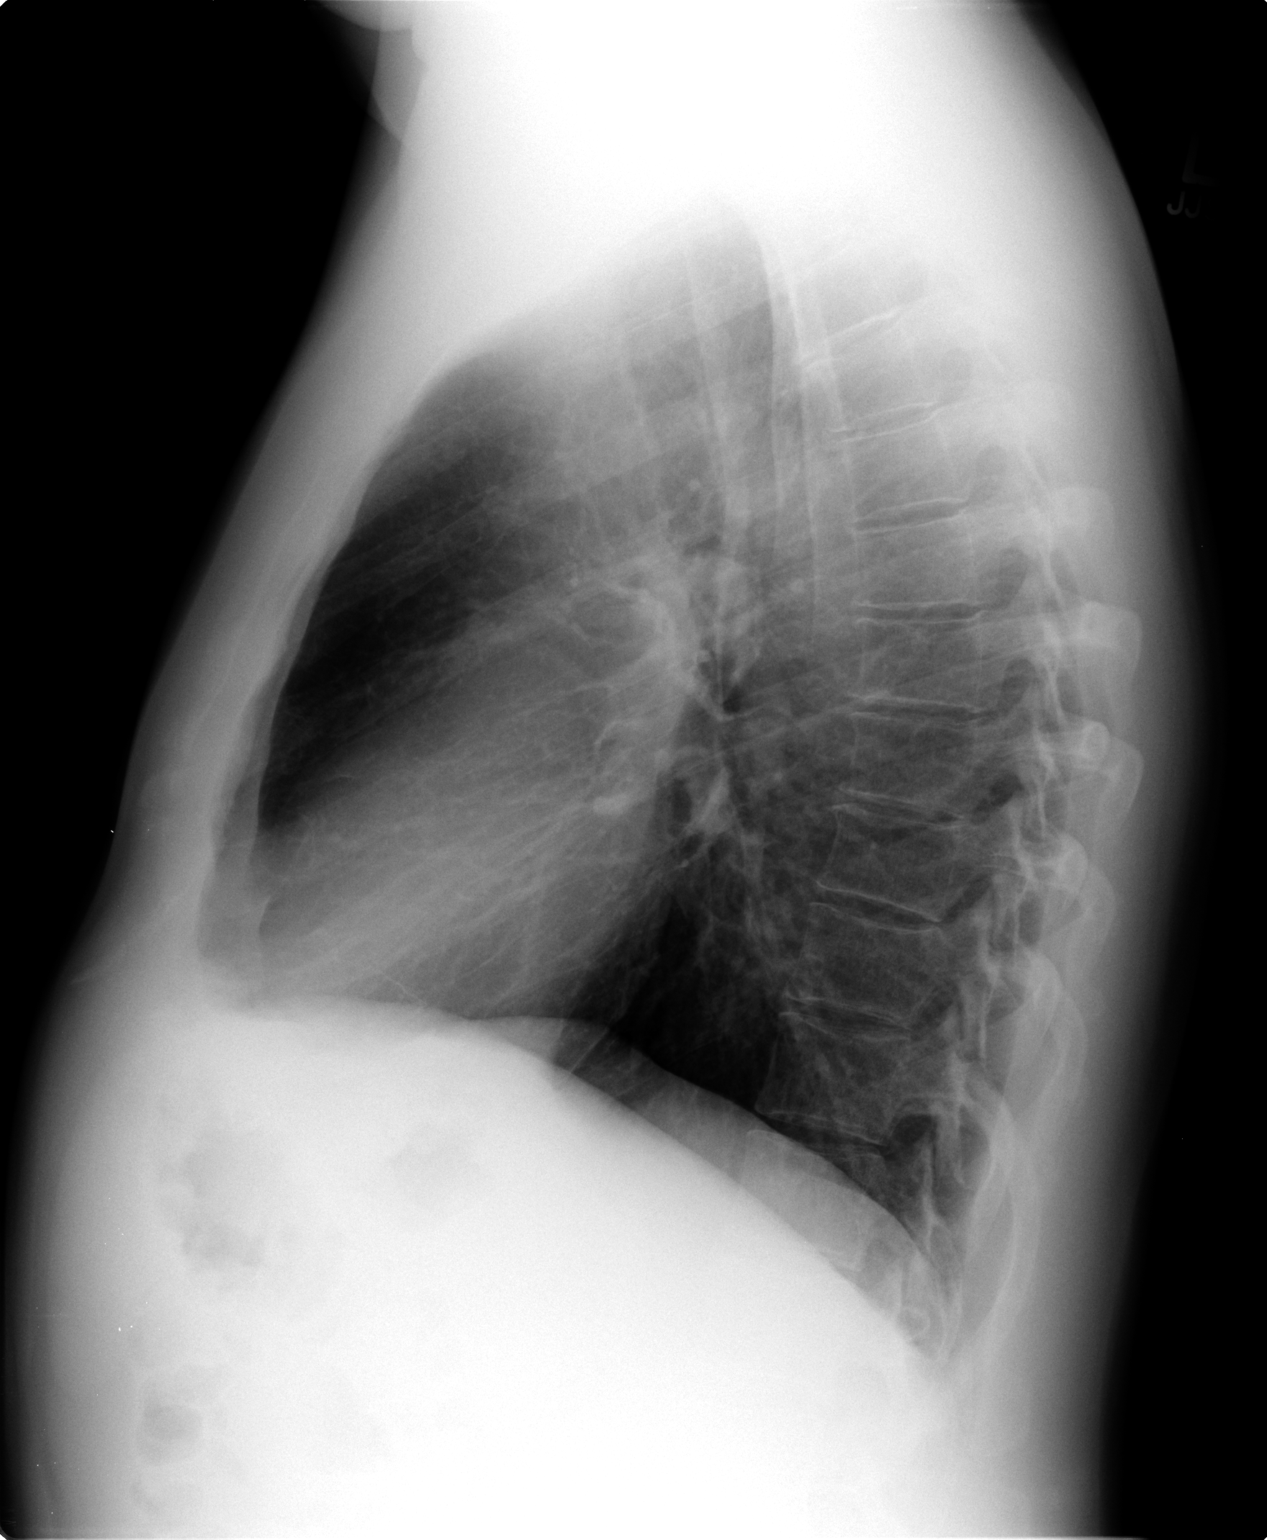

[2 of 2 positions shown; findings below may reference images not displayed]

FINDINGS: The heart, mediastinum and hila are within normal limits.
The lungs are clear.  The bony thorax is intact.
IMPRESSION: No active disease of the chest.  No lung masses or nodules.

## 2013-12-16 DIAGNOSIS — E119 Type 2 diabetes mellitus without complications: Secondary | ICD-10-CM | POA: Insufficient documentation

## 2013-12-16 DIAGNOSIS — E1122 Type 2 diabetes mellitus with diabetic chronic kidney disease: Secondary | ICD-10-CM | POA: Insufficient documentation

## 2013-12-31 ENCOUNTER — Other Ambulatory Visit: Payer: Self-pay | Admitting: Pulmonary Disease

## 2014-01-06 ENCOUNTER — Other Ambulatory Visit: Payer: Self-pay | Admitting: Pulmonary Disease

## 2014-01-11 ENCOUNTER — Other Ambulatory Visit: Payer: Self-pay | Admitting: Pulmonary Disease

## 2014-04-11 ENCOUNTER — Other Ambulatory Visit: Payer: Self-pay | Admitting: Pulmonary Disease

## 2014-04-16 ENCOUNTER — Other Ambulatory Visit: Payer: Self-pay | Admitting: Pulmonary Disease

## 2014-04-21 ENCOUNTER — Other Ambulatory Visit: Payer: Self-pay | Admitting: Pulmonary Disease

## 2014-09-20 ENCOUNTER — Telehealth: Payer: Self-pay | Admitting: Gastroenterology

## 2014-09-20 NOTE — Telephone Encounter (Signed)
Left message to call.

## 2014-09-22 ENCOUNTER — Ambulatory Visit: Payer: Managed Care, Other (non HMO) | Admitting: Nurse Practitioner

## 2014-09-23 ENCOUNTER — Ambulatory Visit (INDEPENDENT_AMBULATORY_CARE_PROVIDER_SITE_OTHER): Payer: Managed Care, Other (non HMO) | Admitting: Nurse Practitioner

## 2014-09-23 ENCOUNTER — Encounter: Payer: Self-pay | Admitting: Nurse Practitioner

## 2014-09-23 VITALS — BP 146/96 | Ht 69.25 in | Wt 163.0 lb

## 2014-09-23 DIAGNOSIS — K219 Gastro-esophageal reflux disease without esophagitis: Secondary | ICD-10-CM

## 2014-09-23 DIAGNOSIS — R1310 Dysphagia, unspecified: Secondary | ICD-10-CM | POA: Diagnosis not present

## 2014-09-23 NOTE — Patient Instructions (Addendum)
Call us if you have recurrent swallowing problems, heartburn, or abdominal pain.  Today your blood pressure was elevated. Please follow up with your Primary Care Provider for blood pressure management.

## 2014-09-23 NOTE — Progress Notes (Signed)
     History of Present Illness:   Patient is a 49 year old male known to Dr. Deatra Ina. He has a remote history of colon polyps. He also has a history of GERD.  Patient comes in for evaluation of epigastric/chest burning. Over the weekend he made a lot of poor food choices and drank excessive alcohol. On Saturday patient had problems swallowing a biscuit. Later that evening he had significant chest / pigastric burning as well as gurgling in his chest. Patient takes daily Protonix, he took Maalox and began to feel better. By Monday symptoms had nearly [facility] and he has been feeling back to normal for the last couple of  days. Patient has lost weight but that has been intentional.    Current Medications, Allergies, Past Medical History, Past Surgical History, Family History and Social History were reviewed in [employer] record.  Physical Exam: General: Pleasant, well developed , white male in no acute distress Head: Normocephalic and atraumatic Eyes:  sclerae anicteric, conjunctiva pink  Ears: Normal auditory acuity Lungs: Clear throughout to auscultation Heart: Regular rate and rhythm Abdomen: Soft, non distended, non-tender. No masses, no hepatomegaly. Normal bowel sounds Musculoskeletal: Symmetrical with no gross deformities  Extremities: No edema  Neurological: Alert oriented x 4, grossly nonfocal Psychological:  Alert and cooperative. Normal mood and affect  Assessment and Recommendations:   Pleasant 49 year old male with long-standing GERD. Over the weekend he had acute burning in chest / epigastrium after drinking excessive alcohol and making poor food choices. He also had an episode of dysphagia while eating a biscuit Saturday, The symptoms lasted a couple of days then spontaneously resolved.  For the last 4 days patient has been feeling back to normal.  No further dysphagia or epigastric discomfort.   Etiology of symptoms unclear, probably multifactorial.  We did talk about risk of pancreatitis with excessive alcohol use. I don't think is worth checking a lipase level at this point He will call us back immediately for any recurrent dysphagia, no matter how subtle,  or if he has any recurrent chest/epigastric comfort. Continue daily PPI

## 2014-09-25 ENCOUNTER — Encounter: Payer: Self-pay | Admitting: Nurse Practitioner

## 2014-09-25 DIAGNOSIS — R1310 Dysphagia, unspecified: Secondary | ICD-10-CM | POA: Insufficient documentation

## 2014-09-27 NOTE — Progress Notes (Signed)
Reviewed and agree with management. Nyisha Clippard D. Emanii Bugbee, M.D., FACG  

## 2014-11-29 ENCOUNTER — Encounter (HOSPITAL_COMMUNITY): Payer: Self-pay | Admitting: Emergency Medicine

## 2014-11-29 ENCOUNTER — Encounter (HOSPITAL_COMMUNITY)
Admission: EM | Disposition: A | Discharge status: Home / Self Care | Payer: Managed Care, Other (non HMO) | Source: Home / Self Care | Attending: Interventional Cardiology

## 2014-11-29 ENCOUNTER — Inpatient Hospital Stay (HOSPITAL_COMMUNITY)
Admission: EM | Admit: 2014-11-29 | Discharge: 2014-12-01 | DRG: 247 | Disposition: A | Discharge status: Home / Self Care | Payer: Managed Care, Other (non HMO) | Attending: Interventional Cardiology | Admitting: Interventional Cardiology

## 2014-11-29 DIAGNOSIS — Z888 Allergy status to other drugs, medicaments and biological substances status: Secondary | ICD-10-CM

## 2014-11-29 DIAGNOSIS — Z79899 Other long term (current) drug therapy: Secondary | ICD-10-CM | POA: Diagnosis not present

## 2014-11-29 DIAGNOSIS — Z720 Tobacco use: Secondary | ICD-10-CM

## 2014-11-29 DIAGNOSIS — Z880 Allergy status to penicillin: Secondary | ICD-10-CM | POA: Diagnosis not present

## 2014-11-29 DIAGNOSIS — I2119 ST elevation (STEMI) myocardial infarction involving other coronary artery of inferior wall: Secondary | ICD-10-CM | POA: Diagnosis present

## 2014-11-29 DIAGNOSIS — I2111 ST elevation (STEMI) myocardial infarction involving right coronary artery: Secondary | ICD-10-CM | POA: Diagnosis not present

## 2014-11-29 DIAGNOSIS — E785 Hyperlipidemia, unspecified: Secondary | ICD-10-CM | POA: Diagnosis present

## 2014-11-29 DIAGNOSIS — R001 Bradycardia, unspecified: Secondary | ICD-10-CM | POA: Diagnosis not present

## 2014-11-29 DIAGNOSIS — Z87891 Personal history of nicotine dependence: Secondary | ICD-10-CM | POA: Diagnosis not present

## 2014-11-29 DIAGNOSIS — Z7902 Long term (current) use of antithrombotics/antiplatelets: Secondary | ICD-10-CM

## 2014-11-29 DIAGNOSIS — R079 Chest pain, unspecified: Secondary | ICD-10-CM | POA: Diagnosis not present

## 2014-11-29 DIAGNOSIS — E119 Type 2 diabetes mellitus without complications: Secondary | ICD-10-CM

## 2014-11-29 DIAGNOSIS — I2510 Atherosclerotic heart disease of native coronary artery without angina pectoris: Secondary | ICD-10-CM

## 2014-11-29 DIAGNOSIS — I10 Essential (primary) hypertension: Secondary | ICD-10-CM | POA: Diagnosis present

## 2014-11-29 DIAGNOSIS — E1159 Type 2 diabetes mellitus with other circulatory complications: Secondary | ICD-10-CM

## 2014-11-29 DIAGNOSIS — I213 ST elevation (STEMI) myocardial infarction of unspecified site: Secondary | ICD-10-CM

## 2014-11-29 DIAGNOSIS — Z7982 Long term (current) use of aspirin: Secondary | ICD-10-CM

## 2014-11-29 HISTORY — DX: Tobacco use: Z72.0

## 2014-11-29 HISTORY — DX: Atherosclerotic heart disease of native coronary artery without angina pectoris: I25.10

## 2014-11-29 HISTORY — PX: CARDIAC CATHETERIZATION: SHX172

## 2014-11-29 LAB — CBC
HEMATOCRIT: 40.3 % (ref 39.0–52.0)
HEMOGLOBIN: 13.4 g/dL (ref 13.0–17.0)
MCH: 32.1 pg (ref 26.0–34.0)
MCHC: 33.3 g/dL (ref 30.0–36.0)
MCV: 96.6 fL (ref 78.0–100.0)
Platelets: 181 10*3/uL (ref 150–400)
RBC: 4.17 MIL/uL — AB (ref 4.22–5.81)
RDW: 12.6 % (ref 11.5–15.5)
WBC: 6.2 10*3/uL (ref 4.0–10.5)

## 2014-11-29 LAB — COMPREHENSIVE METABOLIC PANEL
ALBUMIN: 4.7 g/dL (ref 3.5–5.0)
ALK PHOS: 23 U/L — AB (ref 38–126)
ALT: 32 U/L (ref 17–63)
AST: 35 U/L (ref 15–41)
Anion gap: 12 (ref 5–15)
BILIRUBIN TOTAL: 0.5 mg/dL (ref 0.3–1.2)
BUN: 10 mg/dL (ref 6–20)
CALCIUM: 9.3 mg/dL (ref 8.9–10.3)
CO2: 24 mmol/L (ref 22–32)
Chloride: 103 mmol/L (ref 101–111)
Creatinine, Ser: 1.22 mg/dL (ref 0.61–1.24)
GFR calc Af Amer: >60 mL/min (ref >60)
GFR calc non Af Amer: >60 mL/min (ref >60)
GLUCOSE: 139 mg/dL — AB (ref 65–99)
Potassium: 4.5 mmol/L (ref 3.5–5.1)
SODIUM: 139 mmol/L (ref 135–145)
TOTAL PROTEIN: 7.9 g/dL (ref 6.5–8.1)

## 2014-11-29 LAB — POCT I-STAT, CHEM 8
BUN: 12 mg/dL (ref 6–20)
CREATININE: 1.5 mg/dL — AB (ref 0.61–1.24)
Calcium, Ion: 1.1 mmol/L — ABNORMAL LOW (ref 1.12–1.23)
Chloride: 102 mmol/L (ref 101–111)
Glucose, Bld: 142 mg/dL — ABNORMAL HIGH (ref 65–99)
HEMATOCRIT: 45 % (ref 39.0–52.0)
HEMOGLOBIN: 15.3 g/dL (ref 13.0–17.0)
POTASSIUM: 4.5 mmol/L (ref 3.5–5.1)
SODIUM: 140 mmol/L (ref 135–145)
TCO2: 23 mmol/L (ref 0–100)

## 2014-11-29 LAB — GLUCOSE, CAPILLARY: Glucose-Capillary: 86 mg/dL (ref 65–99)

## 2014-11-29 LAB — CK TOTAL AND CKMB (NOT AT ARMC)
CK TOTAL: 84 U/L (ref 49–397)
CK TOTAL: 591 U/L — AB (ref 49–397)
CK, MB: 2.5 ng/mL (ref 0.5–5.0)
CK, MB: 77.7 ng/mL — AB (ref 0.5–5.0)
RELATIVE INDEX: 13.1 — AB (ref 0.0–2.5)
Relative Index: INVALID (ref 0.0–2.5)

## 2014-11-29 LAB — MRSA PCR SCREENING: MRSA by PCR: NEGATIVE

## 2014-11-29 LAB — PROTIME-INR
INR: 1.04 (ref 0.00–1.49)
PROTHROMBIN TIME: 13.8 s (ref 11.6–15.2)

## 2014-11-29 LAB — POCT I-STAT TROPONIN I: TROPONIN I, POC: 0.07 ng/mL (ref 0.00–0.08)

## 2014-11-29 LAB — POCT ACTIVATED CLOTTING TIME: Activated Clotting Time: 429 seconds

## 2014-11-29 LAB — APTT: aPTT: 29 seconds (ref 24–37)

## 2014-11-29 LAB — LIPID PANEL
Cholesterol: 221 mg/dL — ABNORMAL HIGH (ref 0–200)
HDL: 39 mg/dL — AB (ref >40)
LDL CALC: 139 mg/dL — AB (ref 0–99)
Total CHOL/HDL Ratio: 5.7 RATIO
Triglycerides: 214 mg/dL — ABNORMAL HIGH (ref <150)
VLDL: 43 mg/dL — ABNORMAL HIGH (ref 0–40)

## 2014-11-29 LAB — TROPONIN I: Troponin I: 0.07 ng/mL — ABNORMAL HIGH (ref <0.031)

## 2014-11-29 SURGERY — LEFT HEART CATH AND CORONARY ANGIOGRAPHY
Anesthesia: LOCAL

## 2014-11-29 MED ORDER — SODIUM CHLORIDE 0.9 % IJ SOLN: 3.0000 mL | INTRAMUSCULAR | Status: DC | PRN

## 2014-11-29 MED ORDER — METOPROLOL SUCCINATE ER 25 MG PO TB24
25.0000 mg | ORAL_TABLET | Freq: Every day | ORAL | Status: DC
  Administered 2014-11-29 – 2014-12-01 (×3): 25 mg via ORAL
  Filled 2014-11-29 (×3): qty 1

## 2014-11-29 MED ORDER — SODIUM CHLORIDE 0.9 % WEIGHT BASED INFUSION: 3.0000 mL/kg/h | INTRAVENOUS | Status: AC

## 2014-11-29 MED ORDER — SODIUM CHLORIDE 0.9 % IV SOLN: 250.0000 mL | INTRAVENOUS | Status: DC | PRN

## 2014-11-29 MED ORDER — MIDAZOLAM HCL 2 MG/2ML IJ SOLN
INTRAMUSCULAR | Status: DC | PRN
  Administered 2014-11-29: 2 mg via INTRAVENOUS
  Administered 2014-11-29: 1 mg via INTRAVENOUS

## 2014-11-29 MED ORDER — ASPIRIN EC 81 MG PO TBEC
81.0000 mg | DELAYED_RELEASE_TABLET | Freq: Every day | ORAL | Status: DC
  Administered 2014-11-29 – 2014-12-01 (×3): 81 mg via ORAL
  Filled 2014-11-29 (×4): qty 1

## 2014-11-29 MED ORDER — ASPIRIN 81 MG PO CHEW
CHEWABLE_TABLET | ORAL | Status: AC
  Administered 2014-11-29: 324 mg
  Filled 2014-11-29: qty 4

## 2014-11-29 MED ORDER — HEPARIN (PORCINE) IN NACL 2-0.9 UNIT/ML-% IJ SOLN
INTRAMUSCULAR | Status: AC
  Filled 2014-11-29: qty 1000

## 2014-11-29 MED ORDER — NITROGLYCERIN 0.4 MG SL SUBL
SUBLINGUAL_TABLET | SUBLINGUAL | Status: AC
  Filled 2014-11-29: qty 1

## 2014-11-29 MED ORDER — PANTOPRAZOLE SODIUM 40 MG PO TBEC
40.0000 mg | DELAYED_RELEASE_TABLET | Freq: Every day | ORAL | Status: DC
  Administered 2014-11-29 – 2014-12-01 (×3): 40 mg via ORAL
  Filled 2014-11-29 (×3): qty 1

## 2014-11-29 MED ORDER — ATROPINE SULFATE 0.1 MG/ML IJ SOLN
INTRAMUSCULAR | Status: DC | PRN
  Administered 2014-11-29 (×2): 0.5 mg via INTRAVENOUS

## 2014-11-29 MED ORDER — MIDAZOLAM HCL 2 MG/2ML IJ SOLN
INTRAMUSCULAR | Status: AC
  Filled 2014-11-29: qty 4

## 2014-11-29 MED ORDER — FENTANYL CITRATE (PF) 100 MCG/2ML IJ SOLN
INTRAMUSCULAR | Status: DC | PRN
  Administered 2014-11-29 (×2): 25 ug via INTRAVENOUS

## 2014-11-29 MED ORDER — ASPIRIN 81 MG PO CHEW: 81.0000 mg | CHEWABLE_TABLET | Freq: Every day | ORAL | Status: DC

## 2014-11-29 MED ORDER — BIVALIRUDIN 250 MG IV SOLR
INTRAVENOUS | Status: AC
  Filled 2014-11-29: qty 250

## 2014-11-29 MED ORDER — SODIUM CHLORIDE 0.9 % IV SOLN
0.2500 mg/kg/h | INTRAVENOUS | Status: AC
  Filled 2014-11-29: qty 250

## 2014-11-29 MED ORDER — BIVALIRUDIN 250 MG IV SOLR
250.0000 mg | INTRAVENOUS | Status: DC | PRN
  Administered 2014-11-29: 1.75 mg/kg/h via INTRAVENOUS
  Administered 2014-11-29: 250 mg

## 2014-11-29 MED ORDER — IOHEXOL 350 MG/ML SOLN
INTRAVENOUS | Status: DC | PRN
  Administered 2014-11-29: 105 mL via INTRAVENOUS

## 2014-11-29 MED ORDER — SODIUM CHLORIDE 0.9 % IJ SOLN
3.0000 mL | Freq: Two times a day (BID) | INTRAMUSCULAR | Status: DC
  Administered 2014-12-01: 3 mL via INTRAVENOUS

## 2014-11-29 MED ORDER — TICAGRELOR 90 MG PO TABS
ORAL_TABLET | ORAL | Status: AC
  Filled 2014-11-29: qty 2

## 2014-11-29 MED ORDER — NITROGLYCERIN 1 MG/10 ML FOR IR/CATH LAB
INTRA_ARTERIAL | Status: AC
  Filled 2014-11-29: qty 10

## 2014-11-29 MED ORDER — FENTANYL CITRATE (PF) 100 MCG/2ML IJ SOLN
INTRAMUSCULAR | Status: AC
Start: 2014-11-29 — End: 2014-11-29
  Filled 2014-11-29: qty 4

## 2014-11-29 MED ORDER — NITROGLYCERIN 0.4 MG SL SUBL
SUBLINGUAL_TABLET | SUBLINGUAL | Status: AC
  Administered 2014-11-29: 0.4 mg
  Filled 2014-11-29: qty 1

## 2014-11-29 MED ORDER — ONDANSETRON HCL 4 MG/2ML IJ SOLN: 4.0000 mg | Freq: Four times a day (QID) | INTRAMUSCULAR | Status: DC | PRN

## 2014-11-29 MED ORDER — LIDOCAINE HCL (PF) 1 % IJ SOLN
INTRAMUSCULAR | Status: AC
  Filled 2014-11-29: qty 30

## 2014-11-29 MED ORDER — ATROPINE SULFATE 0.1 MG/ML IJ SOLN
INTRAMUSCULAR | Status: AC
  Filled 2014-11-29: qty 10

## 2014-11-29 MED ORDER — TICAGRELOR 90 MG PO TABS
ORAL_TABLET | ORAL | Status: DC | PRN
  Administered 2014-11-29: 180 mg via ORAL

## 2014-11-29 MED ORDER — BIVALIRUDIN BOLUS VIA INFUSION - CUPID
INTRAVENOUS | Status: DC | PRN
  Administered 2014-11-29: 55.425 mg via INTRAVENOUS

## 2014-11-29 MED ORDER — HEPARIN SODIUM (PORCINE) 5000 UNIT/ML IJ SOLN
INTRAMUSCULAR | Status: AC
  Administered 2014-11-29: 4000 [IU]
  Filled 2014-11-29: qty 1

## 2014-11-29 MED ORDER — TICAGRELOR 90 MG PO TABS
90.0000 mg | ORAL_TABLET | Freq: Two times a day (BID) | ORAL | Status: DC
Start: 2014-11-29 — End: 2014-12-01
  Administered 2014-11-29 – 2014-12-01 (×4): 90 mg via ORAL
  Filled 2014-11-29 (×4): qty 1

## 2014-11-29 MED ORDER — ACETAMINOPHEN 325 MG PO TABS: 650.0000 mg | ORAL_TABLET | ORAL | Status: DC | PRN

## 2014-11-29 SURGICAL SUPPLY — 27 items
BALLN EMERGE MR 2.5X12 (BALLOONS) ×2
BALLN EMERGE MR 2.5X15 (BALLOONS) ×2
BALLN TREK RX 2.5X15 (BALLOONS) ×2
BALLN ~~LOC~~ TREK RX 3.25X12 (BALLOONS) ×2
BALLOON EMERGE MR 2.5X12 (BALLOONS) ×1 IMPLANT
BALLOON EMERGE MR 2.5X15 (BALLOONS) ×1 IMPLANT
BALLOON TREK RX 2.5X15 (BALLOONS) ×1 IMPLANT
BALLOON ~~LOC~~ TREK RX 3.25X12 (BALLOONS) ×1 IMPLANT
CATH EXTRAC PRONTO 5.5F 138CM (CATHETERS) ×2 IMPLANT
CATH INFINITI 5 FR JL3.5 (CATHETERS) ×2 IMPLANT
CATH INFINITI 5FR ANG PIGTAIL (CATHETERS) ×2 IMPLANT
DEVICE WIRE ANGIOSEAL 6FR (Vascular Products) ×2 IMPLANT
ELECT DEFIB PAD ADLT CADENCE (PAD) ×2 IMPLANT
GLIDESHEATH SLEND SS 6F .021 (SHEATH) ×2 IMPLANT
GUIDE CATH RUNWAY 6FR FR4 (CATHETERS) ×2 IMPLANT
KIT ENCORE 26 ADVANTAGE (KITS) ×4 IMPLANT
KIT HEART LEFT (KITS) ×2 IMPLANT
PACK CARDIAC CATHETERIZATION (CUSTOM PROCEDURE TRAY) ×2 IMPLANT
SHEATH PINNACLE 6F 10CM (SHEATH) ×2 IMPLANT
STENT SYNERGY DES 2.75X28 (Permanent Stent) ×2 IMPLANT
STENT SYNERGY DES 3X16 (Permanent Stent) ×2 IMPLANT
SYR MEDRAD MARK V 150ML (SYRINGE) ×2 IMPLANT
TRANSDUCER W/STOPCOCK (MISCELLANEOUS) ×2 IMPLANT
TUBING CIL FLEX 10 FLL-RA (TUBING) ×2 IMPLANT
WIRE ASAHI PROWATER 180CM (WIRE) ×2 IMPLANT
WIRE HI TORQ BMW 190CM (WIRE) ×2 IMPLANT
WIRE SAFE-T 1.5MM-J .035X260CM (WIRE) ×2 IMPLANT

## 2014-11-29 NOTE — Progress Notes (Signed)
Angiomax D/C

## 2014-11-29 NOTE — ED Notes (Signed)
Pt sts left sided CP with radiation to left arm and diaphoresis; pt noted STEMI on EKG

## 2014-11-29 NOTE — Progress Notes (Signed)
Pt report given to [facility]. Transported to [id number]

## 2014-11-29 NOTE — H&P (Signed)
Patient ID: Carlos Montgomery MRN: [id number], DOB/AGE: [birth year]   Admit date: 11/29/2014   Primary Physician: No PCP Per Patient Primary Cardiologist: New  ([name]).   Pt. Profile:  49 y/o male with no prior cardiac history but multiple cardiac risk factors including tobacco abuse, HTN, HLD and DM, presenting as a Code STEMI.   Problem List  Past Medical History  Diagnosis Date  . Diabetes mellitus     metformin  . Hypertension   . Hyperlipemia     Past Surgical History  Procedure Laterality Date  . Tonsillectomy and adenoidectomy    . Retinal detachment surgery    . Broken wrists    . Rhinoplasty       Allergies  Allergies  Allergen Reactions  . Codeine   . Niacin     REACTION: pt states INTOL to Niaspan  . Penicillins   . Rosuvastatin     REACTION: pt states INTOL to Crestor    HPI  The patient is a 49 y/o male with no prior cardiac history, but multiple risk factors including tobacco abuse since the age of 65, non insulin dependent DM, treated HTN and HLD. He notes his mother has a heart murmur but otherwise no family h/o CAD or SCD. He is followed medically by Dr. Lenna Gilford.   He notes that he was in his usual state of health until yesterday evening when he developed mild substernal chest discomfort, off and on. He initially thought it was acid reflux. Earlier this morning, he awoke with recurrent chest discomfort and neck pain. He describes a burning sensation in his chest radiating across the entire precordium. This time, the discomfort was prolonged and associated with malaise, nausea and diaphoresis, prompting him to report to the ED.   EKG on arrival showed inferolateral ST elevations. Code STEMI was activated and he was transported to the [facility] cath lab for emergent LHC, performed by [name]. He was found to have disease in the distal RCA, PDA and PLA bifurcation and underwent successful PCI + stenting with resolution of chest pain.   Home  Medications  Prior to Admission medications   Medication Sig Start Date End Date Taking? Authorizing Provider  ALPRAZolam Duanne Moron) 0.5 MG tablet TAKE 1/2 TO 1 TABLET 3 TIMES A DAY AS NEEDED 04/02/13   Noralee Space, MD  aspirin 81 MG tablet Take 2 tablets daily    Historical Provider, MD  Cinnamon 500 MG TABS Take 3 tablets by mouth daily.      Historical Provider, MD  fenofibrate micronized (ANTARA) 130 MG capsule TAKE 1 CAPSULE (130 MG TOTAL) BY MOUTH DAILY BEFORE BREAKFAST. 10/16/12   Noralee Space, MD  fenofibrate micronized (ANTARA) 130 MG capsule TAKE 1 CAPSULE (130 MG TOTAL) BY MOUTH DAILY BEFORE BREAKFAST. 05/06/13   Noralee Space, MD  losartan (COZAAR) 100 MG tablet TAKE 1 TABLET BY MOUTH EVERY DAY 05/18/13   Noralee Space, MD  magnesium oxide (MAG-OX) 400 MG tablet Take 400 mg by mouth daily.    Historical Provider, MD  metFORMIN (GLUCOPHAGE) 500 MG tablet TAKE 1 TABLET TWICE A DAY WITH MEALS 05/18/13   Noralee Space, MD  metoprolol succinate (TOPROL-XL) 100 MG 24 hr tablet Take 1 tablet (100 mg total) by mouth daily. Take with or immediately following a meal. 05/18/13   Noralee Space, MD  pantoprazole (PROTONIX) 40 MG tablet TAKE 1 TABLET (40 MG TOTAL) BY MOUTH DAILY. 10/29/13   Deborra Medina  Lenna Gilford, MD    Family History  Family History  Problem Relation Age of Onset  . Colon cancer Neg Hx   . Pancreatic cancer Neg Hx   . Stomach cancer Neg Hx     Social History  Social History   Social History  . Marital Status: Married    Spouse Name: N/A  . Number of Children: 2  . Years of Education: N/A   Occupational History  . Funeral Director    Social History Main Topics  . Smoking status: Former Smoker    Types: Cigarettes    Quit date: 03/18/1998  . Smokeless tobacco: Never Used     Comment: smoked <1ppd for 15 yrs & quit ~2000...  Marland Kitchen Alcohol Use: No  . Drug Use: No  . Sexual Activity: Not on file   Other Topics Concern  . Not on file   Social History Narrative     Review of  Systems General:  No chills, fever, night sweats or weight changes.  Cardiovascular:  No chest pain, dyspnea on exertion, edema, orthopnea, palpitations, paroxysmal nocturnal dyspnea. Dermatological: No rash, lesions/masses Respiratory: No cough, dyspnea Urologic: No hematuria, dysuria Abdominal:   No nausea, vomiting, diarrhea, bright red blood per rectum, melena, or hematemesis Neurologic:  No visual changes, wkns, changes in mental status. All other systems reviewed and are otherwise negative except as noted above.  Physical Exam  Blood pressure 133/86, pulse 84, temperature 97.5 F (36.4 C), temperature source Oral, resp. rate 11, weight 163 lb (73.936 kg), SpO2 0 %.  General: Pleasant, NAD Psych: Normal affect. Neuro: Alert and oriented X 3. Moves all extremities spontaneously. HEENT: Normal  Neck: Supple without bruits or JVD. Lungs:  Resp regular and unlabored, CTA. Heart: RRR no s3, s4, or murmurs. Abdomen: Soft, non-tender, non-distended, BS + x 4.  Extremities: No clubbing, cyanosis or edema. DP/PT/Radials 2+ and equal bilaterally.  Labs  Troponin [facility] of Care Test)  Recent Labs  11/29/14 1154  TROPIPOC 0.07    Recent Labs  11/29/14 1215  CKTOTAL 84  CKMB 2.5  TROPONINI 0.07*   Lab Results  Component Value Date   WBC 6.2 11/29/2014   HGB 13.4 11/29/2014   HCT 40.3 11/29/2014   MCV 96.6 11/29/2014   PLT 181 11/29/2014     Recent Labs Lab 11/29/14 1215  NA 139  K 4.5  CL 103  CO2 24  BUN 10  CREATININE 1.22  CALCIUM 9.3  PROT 7.9  BILITOT 0.5  ALKPHOS 23*  ALT 32  AST 35  GLUCOSE 139*   Lab Results  Component Value Date   CHOL 221* 11/29/2014   HDL 39* 11/29/2014   LDLCALC 139* 11/29/2014   TRIG 214* 11/29/2014   No results found for: DDIMER   Radiology/Studies  No results found.  ECG  Inferiorlateral ST elevations   ASSESSMENT AND PLAN  Principal Problem:   Acute inferolateral myocardial infarction Active Problems:    Essential hypertension   Diabetes mellitus   Tobacco abuse  1. Inferolateral STEMI: s/p Culotte bifurcation stenting to the distal RCA, PDA, PLA bifurcation. CP resolved. Continue DAPT with ASA and Brilinta + statin and BB for secondary prevention. Check 2D echo.  2. Essential HTN: BP is well controlled at 133/86. On BB therapy with metoprolol. HR stable Continue to monitor.   3. H/o HLD: reports intolerance to rosuvastatin in the past (nausea/vomitting). Patient reports he tolerated other statins ok. Given STEMI, will add high dose Lipitor. HFTs are ok.  Will get a FLP in the am.   4. DM: on metformin as an OP. Will hold for 48 hrs given recent cath. Hgb A1c pending.   Signed, Lyda Jester, PA-C 11/29/2014, 3:06 PM  I have examined the patient and reviewed assessment and plan and discussed with patient.  Agree with above as stated.  DAPT for a year.  Likely beyond due to bifurcation stenting.  Lipid lowering therapy.  Watch in ICU.  Bradycardia initally with revascularization. Resolved. Low dose metoprolol.    Effie Janoski S.

## 2014-11-29 NOTE — Progress Notes (Signed)
   11/29/14 1249  Clinical Encounter Type  Visited With Family;Health care provider  Visit Type Initial;Patient in surgery  Referral From Care management  Spiritual Encounters  Spiritual Needs Prayer   Chaplain responded to a request to transport patient's wife to the cath lab. Chaplain assisted patient's wife to the waiting area and notified unit Network engineer. Chaplain offered prayer with patient's wife, and is available for support as needed. Pager - [place], Chaplain 11/29/2014 12:51 PM

## 2014-11-29 NOTE — ED Notes (Signed)
Patient's belongings placed in bag and handed to cardiac staff to give wife. Includes wallet, meds, clothing, and jewelry. Patient verbalized to give belongings to wife.

## 2014-11-29 NOTE — Care Management Note (Signed)
Case Management Note  Patient Details  Name: NATHYN LUIZ MRN: [id number] Date of Birth: November 19, 1965  Subjective/Objective:     Adm w mi               Action/Plan: lives w fam   Expected Discharge Date:                  Expected Discharge Plan:  Home/Self Care  In-House Referral:     Discharge planning Services  CM Consult, Medication Assistance  Post Acute Care Choice:    Choice offered to:     DME Arranged:    DME Agency:     HH Arranged:    [place] Agency:     Status of Service:     Medicare Important Message Given:    Date Medicare IM Given:    Medicare IM give by:    Date Additional Medicare IM Given:    Additional Medicare Important Message give by:     If discussed at [place] of Stay Meetings, dates discussed:    Additional Comments: ur review done. Gave pt 30day free and copay assist card for brilinta.  Lacretia Leigh, RN 11/29/2014, 3:32 PM

## 2014-11-29 NOTE — Progress Notes (Signed)
Acute inferolateral MI.  S/P Culotte bifurcation stenting to the distal RCA, PDA, PLA bifurcation.  [place] to ICU.  Tolerated the procedure well.  Full report to follow.   Jettie Booze, MD

## 2014-11-29 NOTE — ED Provider Notes (Signed)
CSN: [id number]     Arrival date & time 11/29/14  1139 History   First MD Initiated Contact with Patient 11/29/14 1159     Chief Complaint  Patient presents with  . Chest Pain     (Consider location/radiation/quality/duration/timing/severity/associated sxs/prior Treatment) Patient is a 49 y.o. male presenting with chest pain. The history is provided by the patient.  Chest Pain Pain location:  Substernal area Pain quality: pressure   Pain radiates to:  Neck and L shoulder Pain severity:  Moderate Onset quality:  Gradual Duration:  1 day Timing:  Constant Progression:  Waxing and waning Chronicity:  New Context: at rest   Context: not breathing   Relieved by:  Nothing Worsened by:  Nothing tried Ineffective treatments:  None tried Associated symptoms: no cough, no fever, no lower extremity edema and no shortness of breath     Past Medical History  Diagnosis Date  . Diabetes mellitus     metformin  . Hypertension   . Hyperlipemia    Past Surgical History  Procedure Laterality Date  . Tonsillectomy and adenoidectomy    . Retinal detachment surgery    . Broken wrists    . Rhinoplasty     Family History  Problem Relation Age of Onset  . Colon cancer Neg Hx   . Pancreatic cancer Neg Hx   . Stomach cancer Neg Hx    Social History  Substance Use Topics  . Smoking status: Former Smoker    Types: Cigarettes    Quit date: 03/18/1998  . Smokeless tobacco: Never Used     Comment: smoked <1ppd for 15 yrs & quit ~2000...  Marland Kitchen Alcohol Use: No    Review of Systems  Constitutional: Negative for fever.  Respiratory: Negative for cough and shortness of breath.   Cardiovascular: Positive for chest pain.  All other systems reviewed and are negative.     Allergies  Codeine; Niacin; Penicillins; Peanut-containing drug products; and Rosuvastatin  Home Medications   Prior to Admission medications   Medication Sig Start Date End Date Taking? Authorizing Provider   ALPRAZolam (XANAX) 0.5 MG tablet TAKE 1/2 TO 1 TABLET 3 TIMES A DAY AS NEEDED Patient taking differently: TAKE 1/2 TO 1 TABLET 3 TIMES A DAY AS NEEDED for anxiety 04/02/13  Yes Noralee Space, MD  aspirin 81 MG tablet Take 81 mg by mouth daily.    Yes Historical Provider, MD  atorvastatin (LIPITOR) 20 MG tablet Take 20 mg by mouth daily. 09/30/14 09/30/15 Yes Historical Provider, MD  Cinnamon 500 MG TABS Take 3 tablets by mouth daily.     Yes Historical Provider, MD  esomeprazole (NEXIUM) 20 MG capsule Take 20 mg by mouth daily before breakfast. 30 minutes 09/28/14 09/28/15 Yes Historical Provider, MD  fenofibrate micronized (ANTARA) 130 MG capsule TAKE 1 CAPSULE (130 MG TOTAL) BY MOUTH DAILY BEFORE BREAKFAST. 10/16/12  Yes Noralee Space, MD  losartan (COZAAR) 100 MG tablet TAKE 1 TABLET BY MOUTH EVERY DAY 05/18/13  Yes Noralee Space, MD  magnesium oxide (MAG-OX) 400 MG tablet Take 400 mg by mouth daily.   Yes Historical Provider, MD  metFORMIN (GLUCOPHAGE) 500 MG tablet TAKE 1 TABLET TWICE A DAY WITH MEALS 05/18/13  Yes Noralee Space, MD  metoprolol succinate (TOPROL-XL) 100 MG 24 hr tablet Take 1 tablet (100 mg total) by mouth daily. Take with or immediately following a meal. 05/18/13  Yes Noralee Space, MD  Multiple Vitamin (THERA) TABS Take 1 tablet by  mouth daily.   Yes Historical Provider, MD  metFORMIN (GLUCOPHAGE) 500 MG tablet Take 500 mg by mouth 2 (two) times daily with a meal. 09/28/14   Historical Provider, MD   BP 135/88 mmHg  Pulse 75  Temp(Src) 97.5 F (36.4 C) (Oral)  Resp 19  Ht 5\' 10"  (1.778 m)  Wt 162 lb 11.2 oz (73.8 kg)  BMI 23.34 kg/m2  SpO2 100% Physical Exam  Constitutional: He is oriented to person, place, and time. He appears well-developed and well-nourished. No distress.  HENT:  Head: Normocephalic and atraumatic.  Eyes: Conjunctivae are normal.  Neck: Neck supple. No tracheal deviation present.  Cardiovascular: Normal rate, regular rhythm and normal heart sounds.    Pulmonary/Chest: Effort normal and breath sounds normal. No respiratory distress.  Abdominal: Soft. He exhibits no distension.  Neurological: He is alert and oriented to person, place, and time.  Skin: Skin is warm and dry.  Psychiatric: He has a normal mood and affect.    ED Course  Procedures (including critical care time) CRITICAL CARE Performed by: Leo Grosser Total critical care time: 20 min Critical care time was exclusive of separately billable procedures and treating other patients. Critical care was necessary to treat or prevent imminent or life-threatening deterioration. Critical care was time spent personally by me on the following activities: development of treatment plan with patient and/or surrogate as well as nursing, discussions with consultants, evaluation of patient's response to treatment, examination of patient, obtaining history from patient or surrogate, ordering and performing treatments and interventions, ordering and review of laboratory studies, ordering and review of radiographic studies, pulse oximetry and re-evaluation of patient's condition.  Emergency Focused Ultrasound Exam Limited Ultrasound of the Heart and Pericardium  Performed and interpreted by Dr. Laneta Simmers Indication: chest pain Multiple views of the heart, pericardium, and IVC are obtained with a multi frequency probe.  Findings: normal contractility, no anechoic fluid, variable IVC collapse Interpretation: nml ejection fraction, no pericardial effusion, no depressed CVP Images archived electronically.  CPT Code: [phone]   Emergency Focused Ultrasound Exam Limited Thorax   Performed and interpreted by Dr Laneta Simmers Longitudinal view of anterior left and right lung fields in real-time with linear probe. Indication: chest pain Findings: + lung sliding + B lines Interpretation: no evidence of pneumothorax. Images electronically archived.   CPT code: [phone]   Labs Review Labs Reviewed  CBC -  Abnormal; Notable for the following:    RBC 4.17 (*)    All other components within normal limits  COMPREHENSIVE METABOLIC PANEL - Abnormal; Notable for the following:    Glucose, Bld 139 (*)    Alkaline Phosphatase 23 (*)    All other components within normal limits  LIPID PANEL - Abnormal; Notable for the following:    Cholesterol 221 (*)    Triglycerides 214 (*)    HDL 39 (*)    VLDL 43 (*)    LDL Cholesterol 139 (*)    All other components within normal limits  TROPONIN I - Abnormal; Notable for the following:    Troponin I 0.07 (*)    All other components within normal limits  POCT I-STAT, CHEM 8 - Abnormal; Notable for the following:    Creatinine, Ser 1.50 (*)    Glucose, Bld 142 (*)    Calcium, Ion 1.10 (*)    All other components within normal limits  PROTIME-INR  APTT  CK TOTAL AND CKMB (NOT AT [facility])  GLUCOSE, CAPILLARY  HEMOGLOBIN U9N  BASIC METABOLIC PANEL  CBC  POCT I-STAT TROPONIN I  I-STAT CHEM 8, ED    Imaging Review No results found. I have personally reviewed and evaluated these images and lab results as part of my medical decision-making.   EKG Interpretation   Date/Time:  Tuesday November 29 2014 11:45:53 EDT Ventricular Rate:  72 PR Interval:  130 QRS Duration: 94 QT Interval:  410 QTC Calculation: 448 R Axis:   81 Text Interpretation:  ** Critical Test Result: STEMI Normal sinus rhythm  ** ** ACUTE MI / STEMI ** ** Consider right ventricular involvement in  acute inferior infarct Abnormal ECG Confirmed by Kesi Perrow MD, Uzziah Rigg ([id number])  on 11/29/2014 5:37:43 PM      MDM   Final diagnoses:  ST elevation myocardial infarction (STEMI), unspecified artery    49 y.o. male presenting with chest pain radiating to shoulders that he first noted this morning when waking up. EKG is c/w inferior STEMI. Cath lab activated. Given ASA, heparin started, NTG administered for symptoms. Pt to cath lab for PCI.     Leo Grosser, MD 11/29/14 [phone]

## 2014-11-29 NOTE — ED Notes (Signed)
Patient has pads on.

## 2014-11-30 ENCOUNTER — Encounter (HOSPITAL_COMMUNITY): Payer: Self-pay | Admitting: Interventional Cardiology

## 2014-11-30 ENCOUNTER — Inpatient Hospital Stay (HOSPITAL_COMMUNITY): Payer: Managed Care, Other (non HMO)

## 2014-11-30 LAB — CK TOTAL AND CKMB (NOT AT ARMC)
CK, MB: 66.5 ng/mL — AB (ref 0.5–5.0)
CK, MB: 73.7 ng/mL — ABNORMAL HIGH (ref 0.5–5.0)
RELATIVE INDEX: 9.4 — AB (ref 0.0–2.5)
Relative Index: 9.9 — ABNORMAL HIGH (ref 0.0–2.5)
Total CK: 709 U/L — ABNORMAL HIGH (ref 49–397)
Total CK: 744 U/L — ABNORMAL HIGH (ref 49–397)

## 2014-11-30 LAB — BASIC METABOLIC PANEL
Anion gap: 8 (ref 5–15)
BUN: 11 mg/dL (ref 6–20)
CHLORIDE: 107 mmol/L (ref 101–111)
CO2: 24 mmol/L (ref 22–32)
Calcium: 8.6 mg/dL — ABNORMAL LOW (ref 8.9–10.3)
Creatinine, Ser: 1.34 mg/dL — ABNORMAL HIGH (ref 0.61–1.24)
GFR calc non Af Amer: >60 mL/min (ref >60)
Glucose, Bld: 96 mg/dL (ref 65–99)
POTASSIUM: 4.4 mmol/L (ref 3.5–5.1)
SODIUM: 139 mmol/L (ref 135–145)

## 2014-11-30 LAB — CBC
HEMATOCRIT: 34.4 % — AB (ref 39.0–52.0)
HEMOGLOBIN: 11.4 g/dL — AB (ref 13.0–17.0)
MCH: 32.1 pg (ref 26.0–34.0)
MCHC: 33.1 g/dL (ref 30.0–36.0)
MCV: 96.9 fL (ref 78.0–100.0)
Platelets: 154 10*3/uL (ref 150–400)
RBC: 3.55 MIL/uL — ABNORMAL LOW (ref 4.22–5.81)
RDW: 12.5 % (ref 11.5–15.5)
WBC: 7.1 10*3/uL (ref 4.0–10.5)

## 2014-11-30 LAB — HEMOGLOBIN A1C
HEMOGLOBIN A1C: 6.2 % — AB (ref 4.8–5.6)
MEAN PLASMA GLUCOSE: 131 mg/dL

## 2014-11-30 MED ORDER — AMLODIPINE BESYLATE 2.5 MG PO TABS
2.5000 mg | ORAL_TABLET | Freq: Every day | ORAL | Status: DC
  Administered 2014-11-30 – 2014-12-01 (×2): 2.5 mg via ORAL
  Filled 2014-11-30 (×2): qty 1

## 2014-11-30 MED ORDER — ATORVASTATIN CALCIUM 80 MG PO TABS
80.0000 mg | ORAL_TABLET | Freq: Every day | ORAL | Status: DC
  Administered 2014-11-30: 80 mg via ORAL
  Filled 2014-11-30: qty 1

## 2014-11-30 MED ORDER — ADULT MULTIVITAMIN W/MINERALS CH
1.0000 | ORAL_TABLET | Freq: Every day | ORAL | Status: DC
  Administered 2014-12-01: 1 via ORAL
  Filled 2014-11-30: qty 1

## 2014-11-30 MED ORDER — ALPRAZOLAM 0.25 MG PO TABS
0.2500 mg | ORAL_TABLET | Freq: Three times a day (TID) | ORAL | Status: DC | PRN
  Administered 2014-11-30: 0.25 mg via ORAL
  Filled 2014-11-30: qty 1

## 2014-11-30 MED ORDER — PANTOPRAZOLE SODIUM 40 MG PO TBEC: 40.0000 mg | DELAYED_RELEASE_TABLET | Freq: Every day | ORAL | Status: DC

## 2014-11-30 MED ORDER — ATORVASTATIN CALCIUM 20 MG PO TABS: 20.0000 mg | ORAL_TABLET | Freq: Every day | ORAL | Status: DC

## 2014-11-30 MED ORDER — THERA PO TABS: 1.0000 | ORAL_TABLET | Freq: Every day | ORAL | Status: DC

## 2014-11-30 MED FILL — Lidocaine HCl Local Preservative Free (PF) Inj 1%: INTRAMUSCULAR | Qty: 30 | Status: AC

## 2014-11-30 MED FILL — Heparin Sodium (Porcine) 2 Unit/ML in Sodium Chloride 0.9%: INTRAMUSCULAR | Qty: 1000 | Status: AC

## 2014-11-30 MED FILL — Nitroglycerin IV Soln 100 MCG/ML in D5W: INTRA_ARTERIAL | Qty: 10 | Status: AC

## 2014-11-30 NOTE — Progress Notes (Signed)
CARDIAC REHAB PHASE I   PRE:  Rate/Rhythm: 68 SR  BP:  Sitting: 146/97        SaO2: 98 RA  MODE:  Ambulation: 700 ft   POST:  Rate/Rhythm: 82 SR  BP:  Sitting: 180/97         SaO2: 100 RA  Pt ambulated 700 ft on RA, independent, steady gait, tolerated well.  Pt c/o of mild chest tightness/twinges,denies dizziness, DOE, declined rest stop. Pt BP elevated before and after ambulation, RN aware. Began MI/stent education. Reviewed anti-platelet therapy, stent card, risk factors, tobacco cessation, gave pt MI book, heart healthy and diabetes diet handouts, and discussed phase 2 cardiac rehab. Pt verbalized understanding. Pt agrees to phase 2 cardiac rehab referral, will send to [facility]. Pt became very defensive when discussing stress management, tobacco and alcohol cessation (pt states he drinks 4 glasses of wine a night), states "I know what I have to do, I have to quit smoking, quit drinking and eat better and I don't need anyone else to tell me what to do or talk to me like my mother." Pt declined further education at this time. Will attempt to complete education tomorrow. Pt in recliner, wife at bedside, call bell within reach. Will follow-up tomorrow.  [phone]  Lenna Sciara, RN, BSN 11/30/2014 11:32 AM

## 2014-11-30 NOTE — Research (Signed)
Dal_GENE Informed Consent   Subject Name: Carlos Montgomery  Subject met inclusion and exclusion criteria.  The informed consent form, study requirements and expectations were reviewed with the subject and questions and concerns were addressed prior to the signing of the consent form.  The subject verbalized understanding of the trail requirements.  The subject agreed to participate in the Dal-Gene trial and signed the informed consent.  The informed consent was obtained prior to performance of any protocol-specific procedures for the subject.  A copy of the signed informed consent was given to the subject and a copy was placed in the subject's medical record.  Sandie Ano 11/30/2014, 6:02 PM

## 2014-11-30 NOTE — Progress Notes (Signed)
Patient Name: Carlos Montgomery Date of Encounter: 11/30/2014  Principal Problem:   Acute inferolateral myocardial infarction Active Problems:   Essential hypertension   Diabetes mellitus   Tobacco abuse   Length of Stay: 1  SUBJECTIVE  No chest pain, no SOB.   CURRENT MEDS . aspirin EC  81 mg Oral Daily  . bivalirudin (ANGIOMAX) infusion 5 mg/mL (Cath Lab,ACS,PCI indication)  0.25 mg/kg/hr Intravenous To Cath  . metoprolol succinate  25 mg Oral Daily  . pantoprazole  40 mg Oral Daily  . sodium chloride  3 mL Intravenous Q12H  . ticagrelor  90 mg Oral BID   OBJECTIVE  Filed Vitals:   11/30/14 0600 11/30/14 0700 11/30/14 0746 11/30/14 0800  BP: 159/84 149/95  149/94  Pulse: 62 72  66  Temp:   98.6 F (37 C)   TempSrc:   Oral   Resp: 14 17  19   Height:      Weight:      SpO2: 96% 100%  98%    Intake/Output Summary (Last 24 hours) at 11/30/14 1006 Last data filed at 11/30/14 0816  Gross per 24 hour  Intake 995.71 ml  Output   1351 ml  Net -355.29 ml   Filed Weights   11/29/14 1315 11/29/14 1445  Weight: 163 lb (73.936 kg) 162 lb 11.2 oz (73.8 kg)    PHYSICAL EXAM  General: Pleasant, NAD. Neuro: Alert and oriented X 3. Moves all extremities spontaneously. Psych: Normal affect. HEENT:  Normal  Neck: Supple without bruits or JVD. Lungs:  Resp regular and unlabored, CTA. Heart: RRR no s3, s4, or murmurs. Abdomen: Soft, non-tender, non-distended, BS + x 4.  Extremities: No clubbing, cyanosis or edema. DP/PT/Radials 2+ and equal bilaterally.  Accessory Clinical Findings  CBC  Recent Labs  11/29/14 1215 11/30/14 0216  WBC 6.2 7.1  HGB 13.4 11.4*  HCT 40.3 34.4*  MCV 96.6 96.9  PLT 181 088   Basic Metabolic Panel  Recent Labs  11/29/14 1215 11/30/14 0216  NA 139 139  K 4.5 4.4  CL 103 107  CO2 24 24  GLUCOSE 139* 96  BUN 10 11  CREATININE 1.22 1.34*  CALCIUM 9.3 8.6*   Liver Function Tests  Recent Labs  11/29/14 1215  AST 35    ALT 32  ALKPHOS 23*  BILITOT 0.5  PROT 7.9  ALBUMIN 4.7   Cardiac Enzymes  Recent Labs  11/29/14 1215 11/29/14 1840 11/29/14 2355 11/30/14 0216  CKTOTAL 84 591* 744* 709*  CKMB 2.5 77.7* 73.7* 66.5*  TROPONINI 0.07*  --   --   --     Recent Labs  11/29/14 1215  HGBA1C 6.2*   Fasting Lipid Panel  Recent Labs  11/29/14 1215  CHOL 221*  HDL 39*  LDLCALC 139*  TRIG 214*  CHOLHDL 5.7   TELE: SR  ECG: SR, Q waves and STE in III, aVF, these are new changes, previously no Q wave and 2 mm STE in the inferior leads    ASSESSMENT AND PLAN  Principal Problem:  Acute inferolateral myocardial infarction Active Problems:  Essential hypertension  Diabetes mellitus  Tobacco abuse  1. Inferolateral STEMI: s/p Culotte bifurcation stenting to the distal RCA, PDA, PLA bifurcation. CP resolved. Continue DAPT with ASA and Brilinta + statin and BB for secondary prevention. Check 2D echo. He had severe disease in a small obtuse marginal branch. This was likely old and not the culprit for his presentation today. If he  fails medical therapy, this would be treatable with PCI.  We will increase atorvastatin to 80 mg po daily.   2. Essential HTN: uncontrolled, add amlodipine 2.5 mg po for now, ACEI or ARB at the discharge.  On BB therapy with metoprolol. HR stable Continue to monitor.   3. H/o HLD: reports intolerance to rosuvastatin in the past (nausea/vomitting). Patient reports he tolerated other statins ok. Increase lipitor to 80 mg po daily.   4. DM: on metformin as an OP. Will hold for 48 hrs given recent cath. Hgb A1c pending.    Signed, Dorothy Spark MD, [facility] 11/30/2014

## 2014-11-30 NOTE — Progress Notes (Signed)
   EKG CRITICAL VALUE     12 lead EKG performed.  Critical value noted. Arron Tamala Julian  RN notified.   Nitza Schmid, CCT 11/30/2014 7:19 AM

## 2014-12-01 ENCOUNTER — Encounter (HOSPITAL_COMMUNITY): Payer: Self-pay | Admitting: Physician Assistant

## 2014-12-01 ENCOUNTER — Inpatient Hospital Stay (HOSPITAL_COMMUNITY): Payer: Managed Care, Other (non HMO)

## 2014-12-01 DIAGNOSIS — I2510 Atherosclerotic heart disease of native coronary artery without angina pectoris: Secondary | ICD-10-CM

## 2014-12-01 DIAGNOSIS — E785 Hyperlipidemia, unspecified: Secondary | ICD-10-CM

## 2014-12-01 MED ORDER — TICAGRELOR 90 MG PO TABS: 90.0000 mg | ORAL_TABLET | Freq: Two times a day (BID) | ORAL | Status: DC

## 2014-12-01 MED ORDER — LOSARTAN POTASSIUM 25 MG PO TABS
25.0000 mg | ORAL_TABLET | Freq: Every day | ORAL | Status: DC
  Administered 2014-12-01: 25 mg via ORAL
  Filled 2014-12-01: qty 1

## 2014-12-01 MED ORDER — ATORVASTATIN CALCIUM 80 MG PO TABS: 80.0000 mg | ORAL_TABLET | Freq: Every day | ORAL | Status: DC

## 2014-12-01 MED ORDER — METOPROLOL SUCCINATE ER 100 MG PO TB24: 100.0000 mg | ORAL_TABLET | Freq: Every day | ORAL | Status: DC

## 2014-12-01 MED ORDER — AMLODIPINE BESYLATE 2.5 MG PO TABS: 2.5000 mg | ORAL_TABLET | Freq: Every day | ORAL | Status: DC

## 2014-12-01 MED ORDER — LOSARTAN POTASSIUM 25 MG PO TABS: 25.0000 mg | ORAL_TABLET | Freq: Every day | ORAL | Status: DC

## 2014-12-01 MED ORDER — NITROGLYCERIN 0.4 MG SL SUBL
0.4000 mg | SUBLINGUAL_TABLET | SUBLINGUAL | Status: DC | PRN
Start: 2014-12-01 — End: 2014-12-20

## 2014-12-01 MED ORDER — METOPROLOL SUCCINATE ER 25 MG PO TB24
75.0000 mg | ORAL_TABLET | Freq: Once | ORAL | Status: AC
  Administered 2014-12-01: 75 mg via ORAL
  Filled 2014-12-01 (×2): qty 1

## 2014-12-01 NOTE — Progress Notes (Signed)
Patient Name: Carlos Montgomery Date of Encounter: 12/01/2014  Primary Cardiologist: [name]   Principal Problem:   Acute inferolateral myocardial infarction Active Problems:   Essential hypertension   Diabetes mellitus   Tobacco abuse    SUBJECTIVE  Denies any CP or SOB. Feeling well. Was not receptive to cardiac rehab this morning.   CURRENT MEDS . amLODipine  2.5 mg Oral Daily  . aspirin EC  81 mg Oral Daily  . atorvastatin  80 mg Oral q1800  . metoprolol succinate  25 mg Oral Daily  . multivitamin with minerals  1 tablet Oral Daily  . pantoprazole  40 mg Oral Daily  . sodium chloride  3 mL Intravenous Q12H  . ticagrelor  90 mg Oral BID    OBJECTIVE  Filed Vitals:   11/30/14 1127 11/30/14 1447 11/30/14 2056 12/01/14 0600  BP: 180/97 140/97 144/97 160/103  Pulse:  75 76 80  Temp:  98.3 F (36.8 C) 99.7 F (37.6 C) 99 F (37.2 C)  TempSrc:  Oral Oral   Resp:  15 18 18   Height:      Weight:    164 lb 8 oz (74.617 kg)  SpO2:  100% 99% 100%    Intake/Output Summary (Last 24 hours) at 12/01/14 1006 Last data filed at 12/01/14 0840  Gross per 24 hour  Intake    740 ml  Output      0 ml  Net    740 ml   Filed Weights   11/29/14 1315 11/29/14 1445 12/01/14 0600  Weight: 163 lb (73.936 kg) 162 lb 11.2 oz (73.8 kg) 164 lb 8 oz (74.617 kg)    PHYSICAL EXAM  General: Pleasant, NAD. Neuro: Alert and oriented X 3. Moves all extremities spontaneously. Psych: Normal affect. HEENT:  Normal  Neck: Supple without bruits or JVD. Lungs:  Resp regular and unlabored, CTA. Heart: RRR no s3, s4, or murmurs. R groin cath site stable.  Abdomen: Soft, non-tender, non-distended, BS + x 4.  Extremities: No clubbing, cyanosis or edema. DP/PT/Radials 2+ and equal bilaterally.  Accessory Clinical Findings  CBC  Recent Labs  11/29/14 1215 11/30/14 0216  WBC 6.2 7.1  HGB 13.4 11.4*  HCT 40.3 34.4*  MCV 96.6 96.9  PLT 181 128   Basic Metabolic Panel  Recent  Labs  11/29/14 1215 11/30/14 0216  NA 139 139  K 4.5 4.4  CL 103 107  CO2 24 24  GLUCOSE 139* 96  BUN 10 11  CREATININE 1.22 1.34*  CALCIUM 9.3 8.6*   Liver Function Tests  Recent Labs  11/29/14 1215  AST 35  ALT 32  ALKPHOS 23*  BILITOT 0.5  PROT 7.9  ALBUMIN 4.7   Cardiac Enzymes  Recent Labs  11/29/14 1215 11/29/14 1840 11/29/14 2355 11/30/14 0216  CKTOTAL 84 591* 744* 709*  CKMB 2.5 77.7* 73.7* 66.5*  TROPONINI 0.07*  --   --   --    Hemoglobin A1C  Recent Labs  11/29/14 1215  HGBA1C 6.2*   Fasting Lipid Panel  Recent Labs  11/29/14 1215  CHOL 221*  HDL 39*  LDLCALC 139*  TRIG 214*  CHOLHDL 5.7   TELE NSR without significant ventricular ectopy    ECG  No new EKG   Radiology/Studies  No results found.  ASSESSMENT AND PLAN  1. Inferolateral STEMI  -cath 11/29/2014 bifurcation Culotte stent to distal RCA and ost PDA treated with 2.75x28 mm Synergy stent, and ost PLA with 2.5x51mm DES.  90% small OM2  - pending echocardiogram, if normal and his BP better, expect discharge later today.   2. HTN: BP uncontrolled, discussed with patient, he states he was on 100mg  Toprol XL at home not 25mg , will give additional 75mg  today and resume 100mg  daily Toprol XL. Continue amlodipine, will readdress whether to restart losartan on followup depend on his BP  3. HLD: lipid panel chol 221, trig 214, HDL 39, LDL 139.   4. DM: hold metformin for 48 hours after cath. A1C 6.2  5. Tobacco abuse: patient says he will no longer use tobacco after this  Signed, Woodward Ku Pager: [phone]   The patient was seen, examined and discussed with Almyra Deforest, PA-C and I agree with the above.   s/p Culotte bifurcation stenting to the distal RCA, PDA, PLA bifurcation. CP resolved. Continue DAPT with ASA and Brilinta + statin and BB for secondary prevention. Echo shows LVEF 55-60% with no regional wall motion abnormalities.  He had severe disease in a small obtuse  marginal branch. This was likely old and not the culprit for his presentation today. If he fails medical therapy, this would be treatable with PCI.  Continue ASA, Effient, atorvastatin 80 mg po daily, Toprol XL 100 mg po daily, add losartan 25 mg po daily.  Discharge home today, follow up with Dr Irish Lack.   Dorothy Spark 12/01/2014

## 2014-12-01 NOTE — Discharge Instructions (Signed)
Please hold Metformin for 48 hours due to interaction with contrast dye, your cath was on 9/13, you can restart metformin on 12/02/2014  No driving for 24 hours. No lifting over 5 lbs for 1 week. No sexual activity for 1 week. Keep procedure site clean & dry. If you notice increased pain, swelling, bleeding or pus, call/return!  You may shower, but no soaking baths/hot tubs/pools for 1 week.

## 2014-12-01 NOTE — Progress Notes (Signed)
Pt teaching and discharge instructions reviewed. Pt understands instructions. VSS. Discharging home with wife.

## 2014-12-01 NOTE — Progress Notes (Signed)
Nutrition Brief Note  Patient identified on the Malnutrition Screening Tool (MST) Report.  Patient reports that he "eats like a pig." Weight loss related to a new medication (metformin) that he has been taking.   Wt Readings from Last 15 Encounters:  12/01/14 164 lb 8 oz (74.617 kg)  09/23/14 163 lb (73.936 kg)  02/08/13 175 lb (79.379 kg)  01/28/13 175 lb (79.379 kg)  12/28/12 185 lb (83.915 kg)  08/12/12 185 lb 12.8 oz (84.278 kg)  02/26/12 184 lb (83.462 kg)  08/22/11 173 lb 12.8 oz (78.835 kg)  02/05/11 176 lb (79.833 kg)  09/06/10 175 lb (79.379 kg)  07/19/10 176 lb 12.8 oz (80.196 kg)  05/23/10 180 lb 6.1 oz (81.82 kg)  05/03/09 175 lb (79.379 kg)  11/12/07 172 lb 8 oz (78.245 kg)    Body mass index is 23.6 kg/(m^2). Patient meets criteria for normal weight based on current BMI.   Current diet order is heart healthy CHO modified, patient is consuming approximately 100% of meals at this time. Labs and medications reviewed.   No nutrition interventions warranted at this time. If nutrition issues arise, please consult RD.   Molli Barrows, RD, LDN, [place] Pager [phone] After Hours Pager [phone]

## 2014-12-01 NOTE — Progress Notes (Signed)
CARDIAC REHAB PHASE I   Pt declined to ambulate with cardiac rehab this morning, states he walked around the unit this morning, denies CP, dizziness, DOE. Pt did not make eye contact during education, attempted to completed MI/stent education with pt and wife. Reviewed anti-platelet therapy, activity restrictions, ntg, exercise, heart healthy and diabetes diet, sodium restrictions, and phase 2 cardiac rehab. Pt and wife verbalized understanding, however pt demonstrates nonacceptance and is not receptive to [employer]. Pt states he is hiring a Physiological scientist and has a family member who is a Automotive engineer who will make menus for him. Discussed pending echo with pt and wife, no results for EF at this time, RN aware and advised to reinforce daily weights, sodium restrictions if EF low. Pt in bed, call bell within reach, wife at bedside. Will sign off.  [phone]  Lenna Sciara, RN, BSN 12/01/2014 8:30 AM

## 2014-12-01 NOTE — Progress Notes (Signed)
  Echocardiogram 2D Echocardiogram has been performed.  Diamond Nickel 12/01/2014, 11:33 AM

## 2014-12-01 NOTE — Care Management Note (Signed)
Case Management Note  Patient Details  Name: Carlos Montgomery MRN: [id number] Date of Birth: December 18, 1965  Subjective/Objective:   Pt admitted for Acute inferolateral myocardial infarction. Plan for home on Brilinta. Previous CM provided card.                 Action/Plan: CM did call [facility] in [place] and medication is available. No further needs from CM at this time.    Expected Discharge Date:                  Expected Discharge Plan:  Home/Self Care  In-House Referral:     Discharge planning Services  CM Consult, Medication Assistance  Post Acute Care Choice:    Choice offered to:     DME Arranged:    DME Agency:     HH Arranged:    [place] Agency:     Status of Service:     Medicare Important Message Given:    Date Medicare IM Given:    Medicare IM give by:    Date Additional Medicare IM Given:    Additional Medicare Important Message give by:     If discussed at [place] of Stay Meetings, dates discussed:    Additional Comments:  Bethena Roys, RN 12/01/2014, 11:09 AM

## 2014-12-01 NOTE — Progress Notes (Signed)
BP 158/109. asymptomatic. given total dose of 100 of metoprolol earlier. MD notifed

## 2014-12-01 NOTE — Discharge Summary (Signed)
Discharge Summary   Patient ID: Carlos [facility],  MRN: [id number], DOB/AGE: 1965-04-25 49 y.o.  Admit date: 11/29/2014 Discharge date: 12/01/2014  Primary Care Provider: No PCP Per Patient Primary Cardiologist: Dr. Irish Montgomery  Discharge Diagnoses Principal Problem:   Acute inferolateral myocardial infarction Active Problems:   Essential hypertension   Diabetes mellitus   Tobacco abuse   Allergies Allergies  Allergen Reactions  . Codeine Palpitations    Increases heart rate, rapid heart rate  . Niacin Other (See Comments)    unknown  . Penicillins Other (See Comments)    Does not know what reaction - happened as a child Was told not to take as a child.  . Peanut-Containing Drug Products Other (See Comments)    Pine nuts only.  . Rosuvastatin Other (See Comments)    Night sweats    Procedures  Echocardiogram 12/01/2014 LV EF: 55% -  60%  ------------------------------------------------------------------- Indications:   CAD of native vessels 414.01.  ------------------------------------------------------------------- History:  Risk factors: Hypertension. Diabetes mellitus. Dyslipidemia.  ------------------------------------------------------------------- Study Conclusions  - Left ventricle: The cavity size was normal. There was moderate concentric hypertrophy. Systolic function was normal. The estimated ejection fraction was in the range of 55% to 60%. Wall motion was normal; there were no regional wall motion abnormalities. Doppler parameters are consistent with abnormal left ventricular relaxation (grade 1 diastolic dysfunction). The E/e&' ratio is between 8-15, suggesting indeterminate LV filling pressure. - Mitral valve: Mildly thickened leaflets . There was trivial regurgitation. - Left atrium: The atrium was normal in size.  Impressions:  - LVEF 55-60%, moderate LVH, normal wall motion, diastolic dysfunction, indeterminate LV  filling pressure, trivial MR, normal LA size.     Cardiac catheterization 11/29/2014 Conclusion     Bifurcation stent to the distal RCA, ostial PDA and ostial PLA with drug eluting stents, 2.75 x 28 Synergy in the RCA to PDA; 2.5 x 16 in the RCA to PLA. Culotte technique was used for bifurcation stenting. Both vessels postdilated to greater than 3.25 mm in diameter.  2nd Mrg lesion, 90% stenosed.  Normal LVEDP.  Continue DAPT for at least a year. He'll need aggressive secondary prevention including lipid-lowering therapy, diabetes control and dietary modification. The patient has lost significant amount of weight recently. He needs to avoid tobacco products. Will obtain echocardiogram. He had severe disease in a small obtuse marginal branch. This was likely old and not the culprit for his presentation today. If he fails medical therapy, this would be treatable with PCI. He will be watched in the ICU. I expect he'll be in the hospital for a couple of days.       Hospital Course  The patient is a 49 year old male with no past cardiac history but multiple cardiac risk factors including tobacco abuse, hypertension, hyperlipidemia and DM who presented to [facility] on 11/29/2014 as a code STEMI. He was in his usual state of health until the night prior to admission when he developed mild substernal chest discomfort. He thought it was acid reflux, the symptom was intermittent. Early in the morning on the day of arrival, he awoke with a recurrent chest discomfort and neck pain. He described a burning sensation in his chest radiating across the entire precordium. EKG on arrival showed inferolateral ST elevation. Code STEMI was activated and he was transferred emergently to [facility] for cardiac catheterization.   Cardiac catheterization 11/29/2014 bifurcation Culotte stent to distal RCA and ost PDA treated with 2.75x28 mm Synergy stent, and ost  PLA with 2.5x1mm DES, 90% small  OM2. He is home statin was changed to Lipitor 80 mg daily. Lipid panel was obtained which showed cholesterol 221, triglycerides 214, HDL 39, LDL 139. Hemoglobin A1c was 6.2. He was transferred out of the ICU on 9/14. He was seen in the morning of 12/01/2014, at which time he was doing well. His blood pressure was uncontrolled, his Toprol-XL has been increased back to his previous home dose 100 mg daily. Emphasis has been placed on compliance with aspirin and Effient. A low-dose amlodipine was added during this admission. His previous home losartan has been restarted post-cath at a lower dose 25 mg daily. He is deemed stable for discharge from cardiology perspective. I have arranged 7 day transition of care follow-up. I have instructed the patient to hold metformin for 48 hours post cath due interaction with contrast dye. I have also advised him not to lift anything greater than 5 pounds for one week. Echocardiogram was obtained prior to discharge on 9/15 which showed EF 34-19%, grade 1 diastolic dysfunction.   Discharge Vitals Blood pressure 158/109, pulse 80, temperature 99 F (37.2 C), temperature source Oral, resp. rate 18, height 5\' 10"  (1.778 m), weight 164 lb 8 oz (74.617 kg), SpO2 99 %.  Filed Weights   11/29/14 1315 11/29/14 1445 12/01/14 0600  Weight: 163 lb (73.936 kg) 162 lb 11.2 oz (73.8 kg) 164 lb 8 oz (74.617 kg)    Labs  CBC  Recent Labs  11/29/14 1215 11/30/14 0216  WBC 6.2 7.1  HGB 13.4 11.4*  HCT 40.3 34.4*  MCV 96.6 96.9  PLT 181 379   Basic Metabolic Panel  Recent Labs  11/29/14 1215 11/30/14 0216  NA 139 139  K 4.5 4.4  CL 103 107  CO2 24 24  GLUCOSE 139* 96  BUN 10 11  CREATININE 1.22 1.34*  CALCIUM 9.3 8.6*   Liver Function Tests  Recent Labs  11/29/14 1215  AST 35  ALT 32  ALKPHOS 23*  BILITOT 0.5  PROT 7.9  ALBUMIN 4.7   Cardiac Enzymes  Recent Labs  11/29/14 1215 11/29/14 1840 11/29/14 2355 11/30/14 0216  CKTOTAL 84 591* 744* 709*    CKMB 2.5 77.7* 73.7* 66.5*  TROPONINI 0.07*  --   --   --    Hemoglobin A1C  Recent Labs  11/29/14 1215  HGBA1C 6.2*   Fasting Lipid Panel  Recent Labs  11/29/14 1215  CHOL 221*  HDL 39*  LDLCALC 139*  TRIG 214*  CHOLHDL 5.7   Disposition  Pt is being discharged home today in good condition.  Follow-up Plans & Appointments      Follow-up Information    Follow up with Richardson Dopp, PA-C On 12/08/2014.   Specialties:  Physician Assistant, Radiology, Interventional Cardiology   Why:  2:40pm.   Contact information:   [address] [place] [address] [phone]       Discharge Medications    Medication List    TAKE these medications        ALPRAZolam 0.5 MG tablet  Commonly known as:  XANAX  TAKE 1/2 TO 1 TABLET 3 TIMES A DAY AS NEEDED     amLODipine 2.5 MG tablet  Commonly known as:  NORVASC  Take 1 tablet (2.5 mg total) by mouth daily.     aspirin 81 MG tablet  Take 81 mg by mouth daily.     atorvastatin 80 MG tablet  Commonly known as:  LIPITOR  Take 1 tablet (80 mg total) by mouth daily at 6 PM.     Cinnamon 500 MG Tabs  Take 3 tablets by mouth daily.     esomeprazole 20 MG capsule  Commonly known as:  NEXIUM  Take 20 mg by mouth daily before breakfast. 30 minutes     fenofibrate micronized 130 MG capsule  Commonly known as:  ANTARA  TAKE 1 CAPSULE (130 MG TOTAL) BY MOUTH DAILY BEFORE BREAKFAST.     losartan 25 MG tablet  Commonly known as:  COZAAR  Take 1 tablet (25 mg total) by mouth daily.     magnesium oxide 400 MG tablet  Commonly known as:  MAG-OX  Take 400 mg by mouth daily.     metFORMIN 500 MG tablet  Commonly known as:  GLUCOPHAGE  TAKE 1 TABLET TWICE A DAY WITH MEALS     metoprolol succinate 100 MG 24 hr tablet  Commonly known as:  TOPROL-XL  Take 1 tablet (100 mg total) by mouth daily. Take with or immediately following a meal.     nitroGLYCERIN 0.4 MG SL tablet  Commonly known as:  NITROSTAT   Place 1 tablet (0.4 mg total) under the tongue every 5 (five) minutes as needed for chest pain.     THERA Tabs  Take 1 tablet by mouth daily.     ticagrelor 90 MG Tabs tablet  Commonly known as:  BRILINTA  Take 1 tablet (90 mg total) by mouth 2 (two) times daily.        Duration of Discharge Encounter   Greater than 30 minutes including physician time.  Hilbert Corrigan PA-C Pager: [phone] 12/01/2014, 2:55 PM

## 2014-12-06 ENCOUNTER — Encounter: Payer: Self-pay | Admitting: *Deleted

## 2014-12-06 DIAGNOSIS — Z006 Encounter for examination for normal comparison and control in clinical research program: Secondary | ICD-10-CM

## 2014-12-06 NOTE — Progress Notes (Unsigned)
[facility] lab results called to patients wife [phone]). Patients wife Sharrie Rothman informed that patient did NOT have the genotype the would qualify him to participate in the research study. I thanked her for allowing [place] to collect labs to see if he could participate, Questions encouraged and answered.

## 2014-12-07 NOTE — Progress Notes (Signed)
Cardiology Office Note   Date:  12/08/2014   ID:  [facility], DOB November 13, 1965, MRN [id number]  PCP:  No PCP Per Patient  Cardiologist:  Dr. Casandra Doffing   Electrophysiologist:  n/a  Chief Complaint  Patient presents with  . Hospitalization Follow-up    s/p Inf-lat STEMI  . Coronary Artery Disease     History of Present Illness: [facility] is a 49 y.o. male with a hx of DM, HTN, HL, tobacco abuse.  Admitted 9/13-9/15 with an inferolateral STEMI.  LHC demonstrated high grade stenosis/subtotal occlusion of the RCA bifurcation involving the PDA and PLA, mod mLCx and OM1 stenosis, high grade stenosis in a small OM2.  He underwent PCI with a Synergy DES to the RCA into the PDA and Synergy DES in the RCA into the PLA.  OM2 is to be treated medically. If he fails medical Rx, this can be considered for PCI.  Echo demonstrated normal LVF.  Post MI course was uneventful.  He returns for FU.    Here with his wife today.  He is doing well.  He has occasional L chest twinges.  These last seconds. He denies dyspnea, orthopnea, PND, edema.  No syncope.      Studies/Reports Reviewed Today:  Echo 12/01/14 Mod LVH, EF 55-60%, no RWMA, Gr 1 DD, trivial MR  LHC 11/29/14 LCx: mid 30%, OM1 ostial 50%, OM2 90% RCA:  Mid 20%, dist 99%, RPDA 100%, R post AV branch 99% PCI:  Bifurcation stent to the distal RCA, ostial PDA and ostial PLA with drug eluting stents, 2.75 x 28 Synergy in the RCA to PDA; 3 x 16 mm Synergy DES in the RCA to PLA.   Continue DAPT for at least a year. He'll need aggressive secondary prevention including lipid-lowering therapy, diabetes control and dietary modification. The patient has lost significant amount of weight recently. He needs to avoid tobacco products. Will obtain echocardiogram. He had severe disease in a small obtuse marginal branch. This was likely old and not the culprit for his presentation today. If he fails medical therapy, this would be treatable with  PCI. He will be watched in the ICU. I expect he'll be in the hospital for a couple of days.          Past Medical History  Diagnosis Date  . Diabetes mellitus     metformin  . Hypertension   . Hyperlipemia   . CAD (coronary artery disease)     cath 11/29/2014 bifurcation Culotte stent to distal RCA and ost PDA treated with 2.75x28 mm Synergy stent, and ost PLA with 2.5x74mm DES, 90% small OM2    Past Surgical History  Procedure Laterality Date  . Tonsillectomy and adenoidectomy    . Retinal detachment surgery    . Broken wrists    . Rhinoplasty    . Cardiac catheterization N/A 11/29/2014    Procedure: Left Heart Cath and Coronary Angiography;  Surgeon: Jettie Booze, MD;  Location: [facility];  Service: Cardiovascular;  Laterality: N/A;  . Cardiac catheterization N/A 11/29/2014    Procedure: Coronary Stent Intervention;  Surgeon: Jettie Booze, MD;  Location: [facility];  Service: Cardiovascular;  Laterality: N/A;     Current Outpatient Prescriptions  Medication Sig Dispense Refill  . ALPRAZolam (XANAX) 0.5 MG tablet TAKE 1/2 TO 1 TABLET 3 TIMES A DAY AS NEEDED (Patient taking differently: TAKE 1/2 TO 1 TABLET 3 TIMES A DAY AS NEEDED for anxiety)  90 tablet 5  . amLODipine (NORVASC) 2.5 MG tablet Take 1 tablet (2.5 mg total) by mouth daily. 90 tablet 3  . aspirin 81 MG tablet Take 81 mg by mouth daily.     Marland Kitchen atorvastatin (LIPITOR) 80 MG tablet Take 1 tablet (80 mg total) by mouth daily at 6 PM. 90 tablet 3  . Cinnamon 500 MG TABS Take 3 tablets by mouth daily.      Marland Kitchen esomeprazole (NEXIUM) 20 MG capsule Take 20 mg by mouth daily before breakfast. 30 minutes    . fenofibrate micronized (ANTARA) 130 MG capsule TAKE 1 CAPSULE (130 MG TOTAL) BY MOUTH DAILY BEFORE BREAKFAST. 30 capsule 5  . losartan (COZAAR) 50 MG tablet Take 1 tablet (50 mg total) by mouth daily. 90 tablet 3  . magnesium oxide (MAG-OX) 400 MG tablet Take 400 mg by mouth daily.    . metFORMIN  (GLUCOPHAGE) 500 MG tablet TAKE 1 TABLET TWICE A DAY WITH MEALS 60 tablet 6  . metoprolol succinate (TOPROL-XL) 100 MG 24 hr tablet Take 1 tablet (100 mg total) by mouth daily. Take with or immediately following a meal. 30 tablet 6  . Multiple Vitamin (THERA) TABS Take 1 tablet by mouth daily.    . nitroGLYCERIN (NITROSTAT) 0.4 MG SL tablet Place 1 tablet (0.4 mg total) under the tongue every 5 (five) minutes as needed for chest pain. 25 tablet 3  . ticagrelor (BRILINTA) 90 MG TABS tablet Take 1 tablet (90 mg total) by mouth 2 (two) times daily. 180 tablet 3   No current facility-administered medications for this visit.    Allergies:   Codeine; Niacin; Penicillins; Peanut-containing drug products; and Rosuvastatin    Social History:  The patient  reports that he quit smoking about 16 years ago. His smoking use included Cigarettes. He has never used smokeless tobacco. He reports that he does not drink alcohol or use illicit drugs.   Family History:  The patient's family history includes Heart attack in his maternal grandfather; Heart disease in his father; Hyperlipidemia in his father. There is no history of Colon cancer, Pancreatic cancer, or Stomach cancer.    ROS:   Please see the history of present illness.   Review of Systems  All other systems reviewed and are negative.     PHYSICAL EXAM: VS:  BP 144/98 mmHg  Pulse 70  Ht 5\' 10"  (1.778 m)  Wt 162 lb 1.9 oz (73.537 kg)  BMI 23.26 kg/m2    Wt Readings from Last 3 Encounters:  12/08/14 162 lb 1.9 oz (73.537 kg)  12/01/14 164 lb 8 oz (74.617 kg)  09/23/14 163 lb (73.936 kg)     GEN: Well nourished, well developed, in no acute distress HEENT: normal Neck: no JVD,  no masses Cardiac:  Normal S1/S2, RRR; no murmur ,  no rubs or gallops, no edema; R groin without hematoma or bruit  Respiratory:  clear to auscultation bilaterally, no wheezing, rhonchi or rales. GI: soft, nontender, nondistended, + BS MS: no deformity or  atrophy Skin: warm and dry  Neuro:  CNs II-XII intact, Strength and sensation are intact Psych: Normal affect   EKG:  EKG is ordered today.  It demonstrates:   NSR, HR 70, inferior Q waves with T-wave inversions in 2, 3, aVF, V4-V6 (evolving inferolateral MI), QTc 401 ms   Recent Labs: 11/29/2014: ALT 32 11/30/2014: BUN 11; Creatinine, Ser 1.34*; Hemoglobin 11.4*; Platelets 154; Potassium 4.4; Sodium 139    Lipid Panel  Component Value Date/Time   CHOL 221* 11/29/2014 1215   TRIG 214* 11/29/2014 1215   HDL 39* 11/29/2014 1215   CHOLHDL 5.7 11/29/2014 1215   VLDL 43* 11/29/2014 1215   LDLCALC 139* 11/29/2014 1215   LDLDIRECT 204.1 12/24/2012 1318      ASSESSMENT AND PLAN:  1. CAD:  S/p inferolateral STEMI treated with DES x 2 to the bifurcation RCA lesion involving the PDA and PLA.  EF is preserved.  He does have residual disease in a small OM2. This is felt to be chronic and initial medical therapy was recommended. If he has refractory angina, will need to consider PCI of his OM2.  He denies any symptoms reminiscent of his previous angina. He does have occasional twinges in his chest that are quite atypical and last only seconds.  Continue aspirin, Brilinta, high-dose statin, beta blocker, ARB.  We discussed the importance of dual antiplatelet therapy.  Refer to Cardiac Rehab.  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    2. HTN:  Uncontrolled.  Increase Losartan 50 mg QD.  BMET 2 weeks.  He will check BP often and call me with readings in a couple of weeks.  May need to increase Norvasc.   3. Hyperlipidemia:  Continue Lipitor 80. Check Lipids and LFTs in 6 weeks.    4. Diabetes Mellitus:  Good control. Recent A1c 6.2.  FU with PCP.  5. Tobacco Abuse:  He has quit smoking.      Medication Changes: Current medicines are reviewed at length with the patient today.   Concerns regarding medicines are as outlined above.  The following changes have been made:   Discontinued Medications   No medications on file   Modified Medications   Modified Medication Previous Medication   LOSARTAN (COZAAR) 50 MG TABLET losartan (COZAAR) 25 MG tablet      Take 1 tablet (50 mg total) by mouth daily.    Take 1 tablet (25 mg total) by mouth daily.   New Prescriptions   No medications on file   Labs/ tests ordered today include:   Orders Placed This Encounter  Procedures  . Lipid Profile  . Hepatic function panel  . Basic Metabolic Panel (BMET)  . Ambulatory referral to [facility]  . EKG 12-Lead      Disposition:    FU with Dr. Casandra Doffing 8 weeks.    Signed, Versie Starks, MHS 12/08/2014 4:14 PM    [facility] [place], [place], [place]  [address] Phone: [phone]; Fax: [phone]

## 2014-12-08 ENCOUNTER — Encounter: Payer: Self-pay | Admitting: Physician Assistant

## 2014-12-08 ENCOUNTER — Ambulatory Visit (INDEPENDENT_AMBULATORY_CARE_PROVIDER_SITE_OTHER): Payer: Managed Care, Other (non HMO) | Admitting: Physician Assistant

## 2014-12-08 VITALS — BP 144/98 | HR 70 | Ht 70.0 in | Wt 162.1 lb

## 2014-12-08 DIAGNOSIS — I10 Essential (primary) hypertension: Secondary | ICD-10-CM

## 2014-12-08 DIAGNOSIS — E785 Hyperlipidemia, unspecified: Secondary | ICD-10-CM | POA: Diagnosis not present

## 2014-12-08 DIAGNOSIS — I2510 Atherosclerotic heart disease of native coronary artery without angina pectoris: Secondary | ICD-10-CM | POA: Diagnosis not present

## 2014-12-08 DIAGNOSIS — E119 Type 2 diabetes mellitus without complications: Secondary | ICD-10-CM

## 2014-12-08 DIAGNOSIS — Z720 Tobacco use: Secondary | ICD-10-CM

## 2014-12-08 MED ORDER — LOSARTAN POTASSIUM 50 MG PO TABS: 50.0000 mg | ORAL_TABLET | Freq: Every day | ORAL | Status: DC

## 2014-12-08 NOTE — Patient Instructions (Signed)
Medication Instructions:  1. INCREASE COZAAR TO 50 MG DAILY; NEW RX SENT IN  Labwork: 1. BMET TO BE DONE IN 1-2 WEEKS  2. FASTING LIPID AND LIVER PANEL TO BE DONE IN 6 WEEKS  Testing/Procedures: NONE  Follow-Up: 6-8 WEEKS WITH DR. VARANASI  YOU ARE BEING REFERRED TO [facility]  Any Other Special Instructions Will Be Listed Below (If Applicable).

## 2014-12-12 ENCOUNTER — Other Ambulatory Visit: Payer: Managed Care, Other (non HMO)

## 2014-12-15 ENCOUNTER — Telehealth (HOSPITAL_COMMUNITY): Payer: Self-pay | Admitting: *Deleted

## 2014-12-15 NOTE — Telephone Encounter (Signed)
Received signed MD order for cardiac rehab. Called to assess readiness. Pt is waiting to hear back from the insurance company for benefits for Cardiac rehab.  Cherre Huger, BSN

## 2014-12-20 ENCOUNTER — Other Ambulatory Visit: Payer: Self-pay

## 2014-12-20 MED ORDER — NITROGLYCERIN 0.4 MG SL SUBL: 0.4000 mg | SUBLINGUAL_TABLET | SUBLINGUAL | Status: DC | PRN

## 2015-01-04 ENCOUNTER — Telehealth: Payer: Self-pay | Admitting: Interventional Cardiology

## 2015-01-04 NOTE — Telephone Encounter (Signed)
Informed dental office patient did not need pre medication prior to teeth cleaning.

## 2015-01-04 NOTE — Telephone Encounter (Signed)
New message   1. What dental office are you calling from? Dr.Tammy Lees  2. What is your office phone and fax number? Fax # [phone] / phone # [phone]  3. What type of procedure is the patient having performed? Cleaning   4. What date is procedure scheduled? 10.19.2016 @ 4 pm    5. What is your question (ex. Antibiotics prior to procedure, holding medication-we need to know how long dentist wants pt to hold med)? Does patient need to be pre-med

## 2015-01-06 ENCOUNTER — Other Ambulatory Visit: Payer: Managed Care, Other (non HMO)

## 2015-01-10 LAB — HM DIABETES EYE EXAM

## 2015-01-11 ENCOUNTER — Other Ambulatory Visit (INDEPENDENT_AMBULATORY_CARE_PROVIDER_SITE_OTHER): Payer: Managed Care, Other (non HMO) | Admitting: *Deleted

## 2015-01-11 DIAGNOSIS — E785 Hyperlipidemia, unspecified: Secondary | ICD-10-CM

## 2015-01-11 DIAGNOSIS — I2510 Atherosclerotic heart disease of native coronary artery without angina pectoris: Secondary | ICD-10-CM

## 2015-01-11 LAB — HEPATIC FUNCTION PANEL
ALT: 58 U/L — AB (ref 9–46)
AST: 40 U/L (ref 10–40)
Albumin: 4.8 g/dL (ref 3.6–5.1)
Alkaline Phosphatase: 22 U/L — ABNORMAL LOW (ref 40–115)
BILIRUBIN DIRECT: 0.1 mg/dL (ref <=0.2)
BILIRUBIN INDIRECT: 0.5 mg/dL (ref 0.2–1.2)
BILIRUBIN TOTAL: 0.6 mg/dL (ref 0.2–1.2)
Total Protein: 7.4 g/dL (ref 6.1–8.1)

## 2015-01-11 LAB — LIPID PANEL
CHOL/HDL RATIO: 7.2 ratio — AB (ref <=5.0)
CHOLESTEROL: 151 mg/dL (ref 125–200)
HDL: 21 mg/dL — ABNORMAL LOW (ref >=40)
LDL Cholesterol: 88 mg/dL (ref <130)
Triglycerides: 208 mg/dL — ABNORMAL HIGH (ref <150)
VLDL: 42 mg/dL — AB (ref <30)

## 2015-01-11 NOTE — Addendum Note (Signed)
Addended by: Eulis Foster on: 01/11/2015 11:22 AM   Modules accepted: Orders

## 2015-01-13 ENCOUNTER — Telehealth: Payer: Self-pay | Admitting: *Deleted

## 2015-01-13 DIAGNOSIS — I2510 Atherosclerotic heart disease of native coronary artery without angina pectoris: Secondary | ICD-10-CM

## 2015-01-13 DIAGNOSIS — I2119 ST elevation (STEMI) myocardial infarction involving other coronary artery of inferior wall: Secondary | ICD-10-CM

## 2015-01-13 DIAGNOSIS — E785 Hyperlipidemia, unspecified: Secondary | ICD-10-CM

## 2015-01-13 NOTE — Telephone Encounter (Signed)
Pt notified of lab results. Pt states he had been on Crestor in the past, states "it does not agree with him". I d/w PA for further recommendations who advised set up w/Lipid Clinic. Pt stated he did that as well. I asked when, pt responds yrs ago. I advised pt that this is here in our office that we started this year and that there are new trial medications. I advised that Pharm D will d/w him to see if he would be a good candidate for one of these new medications. Pt states the dr does not why his cholesterol # are high, says he eats well and exercises. Pt agreeable to plan of care.

## 2015-01-19 ENCOUNTER — Ambulatory Visit (INDEPENDENT_AMBULATORY_CARE_PROVIDER_SITE_OTHER): Payer: Managed Care, Other (non HMO) | Admitting: Pharmacist

## 2015-01-19 ENCOUNTER — Ambulatory Visit (INDEPENDENT_AMBULATORY_CARE_PROVIDER_SITE_OTHER): Payer: Managed Care, Other (non HMO) | Admitting: Interventional Cardiology

## 2015-01-19 ENCOUNTER — Encounter: Payer: Self-pay | Admitting: Interventional Cardiology

## 2015-01-19 VITALS — BP 110/78 | HR 87 | Ht 70.0 in | Wt 166.0 lb

## 2015-01-19 DIAGNOSIS — N529 Male erectile dysfunction, unspecified: Secondary | ICD-10-CM | POA: Diagnosis not present

## 2015-01-19 DIAGNOSIS — E785 Hyperlipidemia, unspecified: Secondary | ICD-10-CM | POA: Diagnosis not present

## 2015-01-19 DIAGNOSIS — I2510 Atherosclerotic heart disease of native coronary artery without angina pectoris: Secondary | ICD-10-CM

## 2015-01-19 DIAGNOSIS — I10 Essential (primary) hypertension: Secondary | ICD-10-CM | POA: Diagnosis not present

## 2015-01-19 DIAGNOSIS — I252 Old myocardial infarction: Secondary | ICD-10-CM | POA: Diagnosis not present

## 2015-01-19 MED ORDER — SILDENAFIL CITRATE 50 MG PO TABS: ORAL_TABLET | ORAL | Status: DC

## 2015-01-19 MED ORDER — EZETIMIBE 10 MG PO TABS: 10.0000 mg | ORAL_TABLET | Freq: Every day | ORAL | Status: DC

## 2015-01-19 NOTE — Patient Instructions (Signed)
Please pick up your prescription for Zetia to start taking once a day. We will follow up with lab work in 3 months - Wednesday, February 1. Lab opens at 7:30am, come in any time after for fasting lipid panel.

## 2015-01-19 NOTE — Progress Notes (Signed)
Patient ID: Carlos Montgomery, male   DOB: 12-Feb-1966, 49 y.o.   MRN: [id number]     Cardiology Office Note   Date:  01/19/2015   ID:  Carlos Montgomery, DOB [birth year], MRN [id number]  PCP:  No PCP Per Patient    No chief complaint on file. f/u CAD/MI   Wt Readings from Last 3 Encounters:  01/19/15 166 lb (75.297 kg)  12/08/14 162 lb 1.9 oz (73.537 kg)  12/01/14 164 lb 8 oz (74.617 kg)       History of Present Illness: Carlos Montgomery is a 49 y.o. male  Who had inferior MI in 9/16.  He thinks the DOE was building prior to this.  He received 2 stents to the RCA.  He did well with the procedure.  He has been exercising.  He does cardio type exercises with a Physiological scientist.  He is doing light weight as well.  He is maintaining his weight loss of 30 lbs.  No NTG usage.  No bleeding problems.  He is back to work. No symptoms like he had prior to his MI.     Past Medical History  Diagnosis Date  . Diabetes mellitus     metformin  . Hypertension   . Hyperlipemia   . CAD (coronary artery disease)     cath 11/29/2014 bifurcation Culotte stent to distal RCA and ost PDA treated with 2.75x28 mm Synergy stent, and ost PLA with 2.5x71mm DES, 90% small OM2    Past Surgical History  Procedure Laterality Date  . Tonsillectomy and adenoidectomy    . Retinal detachment surgery    . Broken wrists    . Rhinoplasty    . Cardiac catheterization N/A 11/29/2014    Procedure: Left Heart Cath and Coronary Angiography;  Surgeon: Jettie Booze, MD;  Location: [facility];  Service: Cardiovascular;  Laterality: N/A;  . Cardiac catheterization N/A 11/29/2014    Procedure: Coronary Stent Intervention;  Surgeon: Jettie Booze, MD;  Location: [facility];  Service: Cardiovascular;  Laterality: N/A;     Current Outpatient Prescriptions  Medication Sig Dispense Refill  . ALPRAZolam (XANAX) 0.5 MG tablet Take 0.5 mg by mouth at bedtime as needed for anxiety (TAKE 1/2 TO 1 TAB BY  MOUTH 3 TIMES A DAY  AS NEEDED FOR ANXIETY).    Marland Kitchen amLODipine (NORVASC) 2.5 MG tablet Take 1 tablet (2.5 mg total) by mouth daily. 90 tablet 3  . aspirin 81 MG tablet Take 81 mg by mouth daily.     Marland Kitchen atorvastatin (LIPITOR) 80 MG tablet Take 1 tablet (80 mg total) by mouth daily at 6 PM. 90 tablet 3  . Cinnamon 500 MG TABS Take 3 tablets by mouth daily.      Marland Kitchen esomeprazole (NEXIUM) 20 MG capsule Take 20 mg by mouth daily before breakfast. 30 minutes    . ezetimibe (ZETIA) 10 MG tablet Take 1 tablet (10 mg total) by mouth daily. 30 tablet 11  . fenofibrate micronized (ANTARA) 130 MG capsule TAKE 1 CAPSULE (130 MG TOTAL) BY MOUTH DAILY BEFORE BREAKFAST. 30 capsule 5  . losartan (COZAAR) 100 MG tablet Take 100 mg by mouth daily.    . magnesium oxide (MAG-OX) 400 MG tablet Take 400 mg by mouth daily.    . metFORMIN (GLUCOPHAGE) 500 MG tablet TAKE 1 TABLET TWICE A DAY WITH MEALS 60 tablet 6  . metoprolol succinate (TOPROL-XL) 100 MG 24 hr tablet Take 1 tablet (100 mg  total) by mouth daily. Take with or immediately following a meal. 30 tablet 6  . Multiple Vitamin (THERA) TABS Take 1 tablet by mouth daily.    . nitroGLYCERIN (NITROSTAT) 0.4 MG SL tablet Place 1 tablet (0.4 mg total) under the tongue every 5 (five) minutes as needed for chest pain. 25 tablet 3  . Omega-3 Fatty Acids (FISH OIL) 1200 MG CAPS Take 3 capsules by mouth daily.    . ticagrelor (BRILINTA) 90 MG TABS tablet Take 1 tablet (90 mg total) by mouth 2 (two) times daily. 180 tablet 3   No current facility-administered medications for this visit.    Allergies:   Codeine; Niacin; Penicillins; Peanut-containing drug products; and Rosuvastatin    Social History:  The patient  reports that he quit smoking about 16 years ago. His smoking use included Cigarettes. He has never used smokeless tobacco. He reports that he does not drink alcohol or use illicit drugs.   Family History:  The patient's family history includes Heart attack (age of  onset: 6) in his paternal grandfather; Heart attack (age of onset: 54) in his maternal grandfather; Heart disease in his father; Hyperlipidemia in his father. There is no history of Colon cancer, Pancreatic cancer, Stomach cancer, Hypertension, or Stroke.    ROS:  Please see the history of present illness.   Otherwise, review of systems are positive for easy bleeding.   All other systems are reviewed and negative.    PHYSICAL EXAM: VS:  BP 110/78 mmHg  Pulse 87  Ht 5\' 10"  (1.778 m)  Wt 166 lb (75.297 kg)  BMI 23.82 kg/m2  SpO2 98% , BMI Body mass index is 23.82 kg/(m^2). GEN: Well nourished, well developed, in no acute distress HEENT: normal Neck: no JVD, carotid bruits, or masses Cardiac: RRR; no murmurs, rubs, or gallops,no edema  Respiratory:  clear to auscultation bilaterally, normal work of breathing GI: soft, nontender, nondistended, + BS MS: no deformity or atrophy Skin: warm and dry, no rash Neuro:  Strength and sensation are intact Psych: euthymic mood, full affect    Recent Labs: 11/30/2014: BUN 11; Creatinine, Ser 1.34*; Hemoglobin 11.4*; Platelets 154; Potassium 4.4; Sodium 139 01/11/2015: ALT 58*   Lipid Panel    Component Value Date/Time   CHOL 151 01/11/2015 1122   TRIG 208* 01/11/2015 1122   HDL 21* 01/11/2015 1122   CHOLHDL 7.2* 01/11/2015 1122   VLDL 42* 01/11/2015 1122   LDLCALC 88 01/11/2015 1122   LDLDIRECT 204.1 12/24/2012 1318     Other studies Reviewed: Additional studies/ records that were reviewed today with results demonstrating: Normal LV function.   ASSESSMENT AND PLAN:  1. CAD/Old MI:  Continue aggressive secondary prevention.  Stressed importance of dual antiplatelet therapy. He has bifurcation stents in the distal RCA system.  Wouldn't plan on switching Brilinta to clopidogrel in September 2017.  2. Hyperlipidemia: Fish oil increased today.  COntinue Lipitor.  LDL came down. Triglycerides still mildly elevated. Continue lifestyle  modifications. Recheck lipids in 6 months. 3. HTN: Continue to watch.  If BP stays low, could consider stopping amlodipine.   4. Erectile dysfunction: OK to use Viagra.  He is not using any nitrates.  Prescription called in for 50 mg tablets. 5. He is given off cigarettes completely.   Current medicines are reviewed at length with the patient today.  The patient concerns regarding his medicines were addressed.  The following changes have been made:  No change  Labs/ tests ordered today include:  No  orders of the defined types were placed in this encounter.    Recommend 150 minutes/week of aerobic exercise Low fat, low carb, high fiber diet recommended  Disposition:   FU in 6 months   Teresita Madura., MD  01/19/2015 3:38 PM    [facility] [place], [place], [place]  [address] Phone: [phone]; Fax: [phone]

## 2015-01-19 NOTE — Progress Notes (Signed)
Patient ID: Carlos Montgomery                 DOB: [birth year], 49 yo                         MRN: [id number]     HPI: Carlos Montgomery is a 49 y.o. male patient referred to lipid clinic by Richardson Dopp. He is a new patient of Dr. Hassell Done after STEMI in September 2016 when he underwent PCI with DES of RCA into the PDA and RCA into the PLA. PMH also significant for HLD, DM2, and HTN. Patient presents today for lipid management. He is currently taking Lipitor 80mg , fenofibrate 130mg , and fish oil and reports no issues tolerating them. He thinks he has also been on Crestor in the past. Our records show lower dose Lipitor and pravastatin in the past as well.  Patient brings in all of his medications today. The only notable difference is that he is taking losartan 100mg  daily and our records showed 50mg  daily. Med list updated to reflect his current dose (confirmed with bottle he brought in).   Current Medications: Lipitor 80mg  daily, fenofibrate 130mg  daily, fish oil 3600mg  daily Intolerances: none Risk Factors: ASCVD - s/p STEMI in 2016 with DES x2, sex, family history  LDL goal: 70mg /dL for secondary prevention, non-HDL goal 100mg /dL  Diet: Patient has made drastic lifestyle changes recently. He cut out salt and sugar, does not drink soda, and avoids desserts.  Breakfast: Egg whites, english muffins instead of bagels, greek yogurt, smoothie with fruit, oatmeal, or high fiber cereal Lunch: kale, fresh vegetables and fruit Dinner: Rotisserie chicken, asparagus, potatoes, salmon Drinks: water or unsweet tea  Exercise: Patient reports that he lost 30 lbs in a few months. He has made big changes in his diet and exercise. He is currently walking every day up to an hour and walks at a brisk pace to get his HR up. Has a personal trainer coming up with an exercise plan - will start with weights.  Family History: Heart attack in his maternal grandfather, heart disease and HLD in his father.   Social  History: Patient reports that he quit smoking about 16 years ago. He reports that he does not drink alcohol or use illicit drugs. Occasional red wine  Labs: 12/2014: TC 151, TG 208, HDL 21, LDL 88, LFTs wnl (Lipitor 80mg  daily) 11/2014: TC 221, TG 214, HDL 39, LDL 139, LFTs wnl (Lipitor 20mg  daily or pravastatin 20mg  per records, patient does not recall) 12/2012: TC 294, TG 390, HDL 30.3, LDL-D 204.1, LFTs wnl (Lipitor 10mg  daily)  Past Medical History  Diagnosis Date  . Diabetes mellitus     metformin  . Hypertension   . Hyperlipemia   . CAD (coronary artery disease)     cath 11/29/2014 bifurcation Culotte stent to distal RCA and ost PDA treated with 2.75x28 mm Synergy stent, and ost PLA with 2.5x28mm DES, 90% small OM2    Current Outpatient Prescriptions on File Prior to Visit  Medication Sig Dispense Refill  . ALPRAZolam (XANAX) 0.5 MG tablet TAKE 1/2 TO 1 TABLET 3 TIMES A DAY AS NEEDED (Patient taking differently: TAKE 1/2 TO 1 TABLET 3 TIMES A DAY AS NEEDED for anxiety) 90 tablet 5  . amLODipine (NORVASC) 2.5 MG tablet Take 1 tablet (2.5 mg total) by mouth daily. 90 tablet 3  . aspirin 81 MG tablet Take 81 mg by mouth daily.     Marland Kitchen  atorvastatin (LIPITOR) 80 MG tablet Take 1 tablet (80 mg total) by mouth daily at 6 PM. 90 tablet 3  . Cinnamon 500 MG TABS Take 3 tablets by mouth daily.      Marland Kitchen esomeprazole (NEXIUM) 20 MG capsule Take 20 mg by mouth daily before breakfast. 30 minutes    . fenofibrate micronized (ANTARA) 130 MG capsule TAKE 1 CAPSULE (130 MG TOTAL) BY MOUTH DAILY BEFORE BREAKFAST. 30 capsule 5  . losartan (COZAAR) 50 MG tablet Take 1 tablet (50 mg total) by mouth daily. 90 tablet 3  . magnesium oxide (MAG-OX) 400 MG tablet Take 400 mg by mouth daily.    . metFORMIN (GLUCOPHAGE) 500 MG tablet TAKE 1 TABLET TWICE A DAY WITH MEALS 60 tablet 6  . metoprolol succinate (TOPROL-XL) 100 MG 24 hr tablet Take 1 tablet (100 mg total) by mouth daily. Take with or immediately following  a meal. 30 tablet 6  . Multiple Vitamin (THERA) TABS Take 1 tablet by mouth daily.    . nitroGLYCERIN (NITROSTAT) 0.4 MG SL tablet Place 1 tablet (0.4 mg total) under the tongue every 5 (five) minutes as needed for chest pain. 25 tablet 3  . ticagrelor (BRILINTA) 90 MG TABS tablet Take 1 tablet (90 mg total) by mouth 2 (two) times daily. 180 tablet 3   No current facility-administered medications on file prior to visit.    Allergies  Allergen Reactions  . Codeine Palpitations    Increases heart rate, rapid heart rate  . Niacin Other (See Comments)    unknown  . Penicillins Other (See Comments)    Does not know what reaction - happened as a child Was told not to take as a child.  . Peanut-Containing Drug Products Other (See Comments)    Pine nuts only.  . Rosuvastatin Other (See Comments)    Night sweats    Assessment/Plan:  1. Hyperlipidemia - Patient currently above goal LDL 70mg /dL given history of STEMI. Patient currently tolerating Lipitor 80mg  daily and LDL has dropped from 139 to 88 in a month since increasing dose of Lipitor. Will add Zetia 10mg  daily for expected additional 20% lowering in LDL. Pt will continue on fish oil and fenofibrate for his TG, expect that his TG will fall as well due to dietary changes and cutting back on sugar intake. Will recheck lipids in 3 months. Patient in agreement with plan. Pt will f/u with [name] this afternoon as scheduled.   Hajra [place] E. Ammarie Matsuura, PharmD [place] [address], [place], [place] [address] Phone: [phone]; Fax: [phone] 01/19/2015 11:25 AM

## 2015-01-19 NOTE — Patient Instructions (Signed)
Medication Instructions:  1) START using Viagra 50mg  as directed  Labwork: Your physician recommends that you return for lab work in: 6 months on same day as appointment (Lipids, liver)   Testing/Procedures: None  Follow-Up: Your physician wants you to follow-up in: 6 months with [name].  You will receive a reminder letter in the mail two months in advance. If you don't receive a letter, please call our office to schedule the follow-up appointment.   Any Other Special Instructions Will Be Listed Below (If Applicable).     If you need a refill on your cardiac medications before your next appointment, please call your pharmacy.

## 2015-01-20 ENCOUNTER — Encounter: Payer: Self-pay | Admitting: Pharmacist

## 2015-01-20 ENCOUNTER — Telehealth: Payer: Self-pay

## 2015-01-20 NOTE — Telephone Encounter (Signed)
Prior auth for Viagra 50mg  sent to [facility].

## 2015-01-31 ENCOUNTER — Other Ambulatory Visit: Payer: Self-pay

## 2015-01-31 ENCOUNTER — Telehealth: Payer: Self-pay

## 2015-01-31 MED ORDER — SILDENAFIL CITRATE 50 MG PO TABS: ORAL_TABLET | ORAL | Status: DC

## 2015-01-31 NOTE — Telephone Encounter (Signed)
Viagra 50mg  approved by [facility]. Effective through 01/25/2016.

## 2015-03-17 ENCOUNTER — Telehealth: Payer: Self-pay | Admitting: General Practice

## 2015-03-17 ENCOUNTER — Ambulatory Visit: Payer: Managed Care, Other (non HMO) | Admitting: Family Medicine

## 2015-03-17 NOTE — Telephone Encounter (Signed)
charge 

## 2015-03-17 NOTE — Telephone Encounter (Signed)
Patient no show 03/17/15 3pm, charge or no charge

## 2015-04-19 ENCOUNTER — Telehealth: Payer: Self-pay | Admitting: *Deleted

## 2015-04-19 ENCOUNTER — Other Ambulatory Visit (INDEPENDENT_AMBULATORY_CARE_PROVIDER_SITE_OTHER): Payer: Managed Care, Other (non HMO) | Admitting: *Deleted

## 2015-04-19 DIAGNOSIS — I10 Essential (primary) hypertension: Secondary | ICD-10-CM

## 2015-04-19 DIAGNOSIS — E785 Hyperlipidemia, unspecified: Secondary | ICD-10-CM | POA: Diagnosis not present

## 2015-04-19 LAB — HEPATIC FUNCTION PANEL
ALBUMIN: 4.6 g/dL (ref 3.6–5.1)
ALK PHOS: 19 U/L — AB (ref 40–115)
ALT: 79 U/L — ABNORMAL HIGH (ref 9–46)
AST: 75 U/L — AB (ref 10–40)
Bilirubin, Direct: 0.1 mg/dL (ref <=0.2)
Indirect Bilirubin: 0.4 mg/dL (ref 0.2–1.2)
TOTAL PROTEIN: 7.2 g/dL (ref 6.1–8.1)
Total Bilirubin: 0.5 mg/dL (ref 0.2–1.2)

## 2015-04-19 LAB — LIPID PANEL
CHOL/HDL RATIO: 6.3 ratio — AB (ref <=5.0)
Cholesterol: 120 mg/dL — ABNORMAL LOW (ref 125–200)
HDL: 19 mg/dL — AB (ref >=40)
LDL Cholesterol: 57 mg/dL (ref <130)
TRIGLYCERIDES: 220 mg/dL — AB (ref <150)
VLDL: 44 mg/dL — ABNORMAL HIGH (ref <30)

## 2015-04-19 NOTE — Addendum Note (Signed)
Addended by: Eulis Foster on: 04/19/2015 10:15 AM   Modules accepted: Orders

## 2015-04-19 NOTE — Telephone Encounter (Signed)
Patient came to the office for brilinta samples as he is unable to afford the medication. Samples provided and I also gave him the number for az and me so that he can call to see if he qualifies for assistance.

## 2015-04-26 ENCOUNTER — Telehealth: Payer: Self-pay | Admitting: Interventional Cardiology

## 2015-04-26 NOTE — Telephone Encounter (Signed)
Patient dropped off only the physician's part of the application. [name] is out of the office till Friday. Patient aware. Will need a few samples of Brilinta 90 mg to carry him through.

## 2015-04-26 NOTE — Telephone Encounter (Signed)
New message      Calling to check the status on getting assistance on his brilinta.  Pt states that he left the form at the front desk.

## 2015-04-27 ENCOUNTER — Telehealth: Payer: Self-pay | Admitting: *Deleted

## 2015-04-27 NOTE — Telephone Encounter (Signed)
Patient came to the office for brilinta samples. Samples provided.

## 2015-04-28 ENCOUNTER — Telehealth: Payer: Self-pay | Admitting: *Deleted

## 2015-04-28 NOTE — Telephone Encounter (Signed)
Unable to reach patient at time of pre-visit call. Listed # for pt's mobile has voicemail box that was identified as someone other than pt or family. Attempted mobile phone # listed in permanent comments, no answer and no voicemail.

## 2015-05-01 ENCOUNTER — Ambulatory Visit: Payer: Managed Care, Other (non HMO) | Admitting: Physician Assistant

## 2015-05-01 ENCOUNTER — Other Ambulatory Visit: Payer: Self-pay

## 2015-05-01 DIAGNOSIS — R945 Abnormal results of liver function studies: Principal | ICD-10-CM

## 2015-05-01 DIAGNOSIS — R7989 Other specified abnormal findings of blood chemistry: Secondary | ICD-10-CM

## 2015-05-01 DIAGNOSIS — E785 Hyperlipidemia, unspecified: Secondary | ICD-10-CM

## 2015-05-04 ENCOUNTER — Telehealth: Payer: Self-pay | Admitting: Behavioral Health

## 2015-05-04 NOTE — Telephone Encounter (Signed)
Unable to reach patient at time of Pre-Visit Call.  Left message for patient to return call when available.    

## 2015-05-05 ENCOUNTER — Encounter: Payer: Self-pay | Admitting: Physician Assistant

## 2015-05-05 ENCOUNTER — Telehealth: Payer: Self-pay | Admitting: General Practice

## 2015-05-05 ENCOUNTER — Ambulatory Visit (INDEPENDENT_AMBULATORY_CARE_PROVIDER_SITE_OTHER): Payer: Managed Care, Other (non HMO) | Admitting: Physician Assistant

## 2015-05-05 VITALS — BP 146/102 | HR 88 | Temp 98.3°F | Ht 70.0 in | Wt 168.6 lb

## 2015-05-05 DIAGNOSIS — I10 Essential (primary) hypertension: Secondary | ICD-10-CM | POA: Diagnosis not present

## 2015-05-05 DIAGNOSIS — E785 Hyperlipidemia, unspecified: Secondary | ICD-10-CM

## 2015-05-05 DIAGNOSIS — R7303 Prediabetes: Secondary | ICD-10-CM

## 2015-05-05 MED ORDER — PANTOPRAZOLE SODIUM 40 MG PO TBEC: 40.0000 mg | DELAYED_RELEASE_TABLET | Freq: Every day | ORAL | Status: DC

## 2015-05-05 MED ORDER — ALPRAZOLAM 0.5 MG PO TABS: 0.5000 mg | ORAL_TABLET | Freq: Every evening | ORAL | Status: DC | PRN

## 2015-05-05 NOTE — Telephone Encounter (Signed)
Relation to [id number] Call back [place]: [id number] - [place], [place] [phone] (Phone) [phone] (Fax)         Reason for call:  Patient states pharmacy never received pantoprazole (PROTONIX) 40 MG tablet  and patient requesting a refill ALPRAZolam (XANAX) 0.5 MG tablet

## 2015-05-05 NOTE — Progress Notes (Signed)
Pre visit review using our clinic review tool, if applicable. No additional management support is needed unless otherwise documented below in the visit note. 

## 2015-05-05 NOTE — Patient Instructions (Signed)
Please stop the Nexium and start the Protonix.  Continue other medications as directed. Make sure to take BP medications daily as directed, especially when coming in for appointments.  Follow-up with me in 3-4 weeks for a complete physical. Make sure to take medications before visit.

## 2015-05-05 NOTE — Progress Notes (Signed)
Patient presents to clinic today to transfer care.  Chronic Issues: Hypertension -- With history of CAD and  prior MI s/p dual stent placement. Is followed by [name] (Cardiology). Is currently on a regimen of amlodipine, losartan and Toprol XL. Is also on 81 mg ASA daily. Patient denies chest pain, palpitations, lightheadedness, dizziness, vision changes or frequent headaches.  BP Readings from Last 3 Encounters:  05/05/15 146/102  01/19/15 110/78  12/08/14 144/98   Endorses he has not taken any medication today.   Hyperlipidemia/CAD -- Followed by Cardiology. Is currently on 81 mg ASA, Lipitor 80 mg, Fenofibrate 130 mg and Zetia 10 mg daily.   Pre-Diabetes -- Was started on Metformin 500 mg BID to help keep A1C at goal. Is working on diet. Does not check sugar levels.  Past Medical History  Diagnosis Date  . Diabetes mellitus     metformin  . Hypertension   . Hyperlipemia   . CAD (coronary artery disease)     cath 11/29/2014 bifurcation Culotte stent to distal RCA and ost PDA treated with 2.75x28 mm Synergy stent, and ost PLA with 2.5x59mm DES, 90% small OM2    Past Surgical History  Procedure Laterality Date  . Tonsillectomy and adenoidectomy    . Retinal detachment surgery    . Broken wrists    . Rhinoplasty    . Cardiac catheterization N/A 11/29/2014    Procedure: Left Heart Cath and Coronary Angiography;  Surgeon: Jettie Booze, MD;  Location: [facility];  Service: Cardiovascular;  Laterality: N/A;  . Cardiac catheterization N/A 11/29/2014    Procedure: Coronary Stent Intervention;  Surgeon: Jettie Booze, MD;  Location: [facility];  Service: Cardiovascular;  Laterality: N/A;    Current Outpatient Prescriptions on File Prior to Visit  Medication Sig Dispense Refill  . amLODipine (NORVASC) 2.5 MG tablet Take 1 tablet (2.5 mg total) by mouth daily. 90 tablet 3  . aspirin 81 MG tablet Take 81 mg by mouth daily.     Marland Kitchen atorvastatin  (LIPITOR) 80 MG tablet Take 1 tablet (80 mg total) by mouth daily at 6 PM. 90 tablet 3  . Cinnamon 500 MG TABS Take 3 tablets by mouth daily.      Marland Kitchen ezetimibe (ZETIA) 10 MG tablet Take 1 tablet (10 mg total) by mouth daily. 30 tablet 11  . fenofibrate micronized (ANTARA) 130 MG capsule TAKE 1 CAPSULE (130 MG TOTAL) BY MOUTH DAILY BEFORE BREAKFAST. 30 capsule 5  . losartan (COZAAR) 100 MG tablet Take 100 mg by mouth daily.    . magnesium oxide (MAG-OX) 400 MG tablet Take 400 mg by mouth daily.    . metFORMIN (GLUCOPHAGE) 500 MG tablet TAKE 1 TABLET TWICE A DAY WITH MEALS 60 tablet 6  . metoprolol succinate (TOPROL-XL) 100 MG 24 hr tablet Take 1 tablet (100 mg total) by mouth daily. Take with or immediately following a meal. 30 tablet 6  . Multiple Vitamin (THERA) TABS Take 1 tablet by mouth daily.    . nitroGLYCERIN (NITROSTAT) 0.4 MG SL tablet Place 1 tablet (0.4 mg total) under the tongue every 5 (five) minutes as needed for chest pain. 25 tablet 3  . Omega-3 Fatty Acids (FISH OIL) 1200 MG CAPS Take 3 capsules by mouth daily.    . sildenafil (VIAGRA) 50 MG tablet Use as directed 8 tablet 1  . ticagrelor (BRILINTA) 90 MG TABS tablet Take 1 tablet (90 mg total) by mouth 2 (two) times daily.  180 tablet 3   No current facility-administered medications on file prior to visit.    Allergies  Allergen Reactions  . Codeine Palpitations    Increases heart rate, rapid heart rate  . Niacin Other (See Comments)    unknown  . Penicillins Other (See Comments)    Does not know what reaction - happened as a child Was told not to take as a child.  . Peanut-Containing Drug Products Other (See Comments)    Pine nuts only.  . Rosuvastatin Other (See Comments)    Night sweats    Family History  Problem Relation Age of Onset  . Colon cancer Neg Hx   . Pancreatic cancer Neg Hx   . Stomach cancer Neg Hx   . Heart disease Father   . Hyperlipidemia Father   . Heart attack Maternal Grandfather 90  .  Heart attack Paternal Grandfather 43  . Hypertension Neg Hx   . Stroke Neg Hx     Social History   Social History  . Marital Status: Married    Spouse Name: N/A  . Number of Children: 2  . Years of Education: N/A   Occupational History  . Funeral Director    Social History Main Topics  . Smoking status: Former Smoker    Types: Cigarettes    Quit date: 03/18/1998  . Smokeless tobacco: Never Used     Comment: smoked <1ppd for 15 yrs & quit ~2000...  Marland Kitchen Alcohol Use: No  . Drug Use: No  . Sexual Activity: Yes   Other Topics Concern  . Not on file   Social History Narrative   Review of Systems  Eyes: Negative for blurred vision and double vision.  Respiratory: Negative for cough, sputum production and shortness of breath.   Cardiovascular: Negative for chest pain and palpitations.  Neurological: Negative for dizziness and loss of consciousness.  Psychiatric/Behavioral: Negative for depression, suicidal ideas, hallucinations and substance abuse. The patient is not nervous/anxious and does not have insomnia.     BP 146/102 mmHg  Pulse 88  Temp(Src) 98.3 F (36.8 C) (Oral)  Ht 5\' 10"  (1.778 m)  Wt 168 lb 9.6 oz (76.476 kg)  BMI 24.19 kg/m2  SpO2 99%  Physical Exam  Constitutional: He is oriented to person, place, and time and well-developed, well-nourished, and in no distress.  HENT:  Head: Normocephalic and atraumatic.  Eyes: Conjunctivae are normal.  Cardiovascular: Normal rate, regular rhythm, normal heart sounds and intact distal pulses.   Pulmonary/Chest: Effort normal and breath sounds normal. No respiratory distress. He has no wheezes. He has no rales. He exhibits no tenderness.  Neurological: He is alert and oriented to person, place, and time.  Skin: Skin is warm and dry. No rash noted.  Psychiatric: Affect normal.  Vitals reviewed.   Recent Results (from the past 2160 hour(s))  Lipid panel     Status: Abnormal   Collection Time: 04/19/15 10:15 AM    Result Value Ref Range   Cholesterol 120 (L) 125 - 200 mg/dL   Triglycerides 220 (H) <150 mg/dL   HDL 19 (L) >=40 mg/dL   Total CHOL/HDL Ratio 6.3 (H) <=5.0 Ratio   VLDL 44 (H) <30 mg/dL   LDL Cholesterol 57 <130 mg/dL    Comment:   Total Cholesterol/HDL Ratio:CHD Risk                        Coronary Heart Disease Risk Table  Men       Women          1/2 Average Risk              3.4        3.3              Average Risk              5.0        4.4           2X Average Risk              9.6        7.1           3X Average Risk             23.4       11.0 Use the calculated Patient Ratio above and the CHD Risk table  to determine the patient's CHD Risk.   Hepatic function panel     Status: Abnormal   Collection Time: 04/19/15 10:15 AM  Result Value Ref Range   Total Bilirubin 0.5 0.2 - 1.2 mg/dL   Bilirubin, Direct 0.1 <=0.2 mg/dL   Indirect Bilirubin 0.4 0.2 - 1.2 mg/dL   Alkaline Phosphatase 19 (L) 40 - 115 U/L   AST 75 (H) 10 - 40 U/L   ALT 79 (H) 9 - 46 U/L   Total Protein 7.2 6.1 - 8.1 g/dL   Albumin 4.6 3.6 - 5.1 g/dL    Assessment/Plan: Essential hypertension BP above goal today in this patient with significant coronary history. Has not taken any of his BP medications today. Discussed importance of medication compliance to lower CV risk and so we can adequately assess BP regimen in office. He will take medications when he gets home today and agrees to take before his CPE in a couple of weeks.  Pre-diabetes Will continue Metformin for now but discussed need to repeat BMP and A1C at CPE next week. Will alter regimen based on results.  HLD (hyperlipidemia) Continue current medication regimen.

## 2015-05-05 NOTE — Telephone Encounter (Signed)
For the Protonix it shows the pharmacy confirmed receipt but I will send again.  I will fax in refill for xanax.

## 2015-05-07 NOTE — Assessment & Plan Note (Signed)
Will continue Metformin for now but discussed need to repeat BMP and A1C at CPE next week. Will alter regimen based on results.

## 2015-05-07 NOTE — Assessment & Plan Note (Signed)
BP above goal today in this patient with significant coronary history. Has not taken any of his BP medications today. Discussed importance of medication compliance to lower CV risk and so we can adequately assess BP regimen in office. He will take medications when he gets home today and agrees to take before his CPE in a couple of weeks.

## 2015-05-07 NOTE — Assessment & Plan Note (Signed)
Continue current medication regimen

## 2015-05-10 ENCOUNTER — Telehealth: Payer: Self-pay

## 2015-05-10 NOTE — Telephone Encounter (Signed)
OK to change to clopidogrel.  300 mg x1 followed by 75 mg daily.

## 2015-05-10 NOTE — Telephone Encounter (Signed)
Patient's application for Brilinta denied by [employer]. His co-pay is $240.00 per month. He can't afford that. Would you consider changing it?

## 2015-05-11 MED ORDER — CLOPIDOGREL BISULFATE 75 MG PO TABS: ORAL_TABLET | ORAL | Status: DC

## 2015-05-11 NOTE — Telephone Encounter (Signed)
The pt is advised and he verbalized understanding and is in agreement with plan. RX for Plavix 75 mg #34 with 6 refills has been sent to [facility] on [address] per the pts request.

## 2015-05-19 NOTE — Telephone Encounter (Signed)
Patient Assistance denied.

## 2015-06-02 ENCOUNTER — Telehealth: Payer: Self-pay | Admitting: Behavioral Health

## 2015-06-02 ENCOUNTER — Encounter: Payer: Self-pay | Admitting: Behavioral Health

## 2015-06-02 NOTE — Telephone Encounter (Signed)
Pre-Visit Call completed with patient and chart updated.   Pre-Visit Info documented in Specialty Comments under [employer].    

## 2015-06-05 ENCOUNTER — Ambulatory Visit (INDEPENDENT_AMBULATORY_CARE_PROVIDER_SITE_OTHER): Payer: Managed Care, Other (non HMO) | Admitting: Physician Assistant

## 2015-06-05 ENCOUNTER — Encounter: Payer: Self-pay | Admitting: Physician Assistant

## 2015-06-05 VITALS — BP 128/88 | HR 81 | Temp 98.0°F | Resp 16 | Ht 70.0 in | Wt 167.5 lb

## 2015-06-05 DIAGNOSIS — R7303 Prediabetes: Secondary | ICD-10-CM

## 2015-06-05 DIAGNOSIS — Z0000 Encounter for general adult medical examination without abnormal findings: Secondary | ICD-10-CM

## 2015-06-05 DIAGNOSIS — I10 Essential (primary) hypertension: Secondary | ICD-10-CM | POA: Diagnosis not present

## 2015-06-05 DIAGNOSIS — E785 Hyperlipidemia, unspecified: Secondary | ICD-10-CM

## 2015-06-05 LAB — LIPID PANEL
CHOLESTEROL: 131 mg/dL (ref 0–200)
HDL: 26.5 mg/dL — ABNORMAL LOW (ref >39.00)
NONHDL: 104.58
TRIGLYCERIDES: 292 mg/dL — AB (ref 0.0–149.0)
Total CHOL/HDL Ratio: 5
VLDL: 58.4 mg/dL — AB (ref 0.0–40.0)

## 2015-06-05 LAB — CBC
HCT: 36.5 % — ABNORMAL LOW (ref 39.0–52.0)
Hemoglobin: 12.2 g/dL — ABNORMAL LOW (ref 13.0–17.0)
MCHC: 33.3 g/dL (ref 30.0–36.0)
MCV: 94.5 fl (ref 78.0–100.0)
Platelets: 193 10*3/uL (ref 150.0–400.0)
RBC: 3.87 Mil/uL — ABNORMAL LOW (ref 4.22–5.81)
RDW: 13.5 % (ref 11.5–15.5)
WBC: 5.2 10*3/uL (ref 4.0–10.5)

## 2015-06-05 LAB — URINALYSIS, ROUTINE W REFLEX MICROSCOPIC
Bilirubin Urine: NEGATIVE
Hgb urine dipstick: NEGATIVE
KETONES UR: NEGATIVE
Leukocytes, UA: NEGATIVE
Nitrite: NEGATIVE
RBC / HPF: NONE SEEN (ref 0–?)
SPECIFIC GRAVITY, URINE: 1.025 (ref 1.000–1.030)
Total Protein, Urine: NEGATIVE
UROBILINOGEN UA: 0.2 (ref 0.0–1.0)
Urine Glucose: NEGATIVE
WBC UA: NONE SEEN (ref 0–?)
pH: 5.5 (ref 5.0–8.0)

## 2015-06-05 LAB — COMPREHENSIVE METABOLIC PANEL
ALK PHOS: 23 U/L — AB (ref 39–117)
ALT: 57 U/L — AB (ref 0–53)
AST: 42 U/L — AB (ref 0–37)
Albumin: 4.8 g/dL (ref 3.5–5.2)
BILIRUBIN TOTAL: 0.6 mg/dL (ref 0.2–1.2)
BUN: 12 mg/dL (ref 6–23)
CO2: 24 meq/L (ref 19–32)
CREATININE: 1.33 mg/dL (ref 0.40–1.50)
Calcium: 9.3 mg/dL (ref 8.4–10.5)
Chloride: 103 mEq/L (ref 96–112)
GFR: 61.06 mL/min (ref >60.00)
GLUCOSE: 90 mg/dL (ref 70–99)
Potassium: 4.4 mEq/L (ref 3.5–5.1)
SODIUM: 138 meq/L (ref 135–145)
TOTAL PROTEIN: 7.3 g/dL (ref 6.0–8.3)

## 2015-06-05 LAB — TSH: TSH: 1.43 u[IU]/mL (ref 0.35–4.50)

## 2015-06-05 LAB — LDL CHOLESTEROL, DIRECT: Direct LDL: 79 mg/dL

## 2015-06-05 LAB — HEMOGLOBIN A1C: Hgb A1c MFr Bld: 6.6 % — ABNORMAL HIGH (ref 4.6–6.5)

## 2015-06-05 NOTE — Assessment & Plan Note (Signed)
Well-controlled. Asymptomatic.

## 2015-06-05 NOTE — Assessment & Plan Note (Signed)
Will repeat A1C and BMP today. Continue Metformin for now. Diet and exercise reviewed. Foot exam updated and within normal limits.

## 2015-06-05 NOTE — Progress Notes (Signed)
Patient presents to clinic today for annual exam.  Patient is fasting for labs.  Acute Concerns: No acute concerns at today's visit. Patient is eating a well-balanced diet. Is walking 1 mile each day. Body mass index is 24.03 kg/(m^2).  Chronic Issues: Hypertension -- Is taking medications as directed. Patient denies chest pain, palpitations, lightheadedness, dizziness, vision changes or frequent headaches.  BP Readings from Last 3 Encounters:  06/05/15 128/88  05/05/15 146/102  01/19/15 110/78   Hyperlipidemia -- Endorses taking his Lipitor 80 mg and Zetia 10 mg daily without myalgias. Followed by Cardiology. Is due for repeat labs.  Pre-diabetes -- Currently on Metformin. Due for repeat labs. Last A1C at 6.2. Trying to keep more strict control due to cardiac history. Is due for foot exam. Denies hx of neuropathy, nephropathy or retinopathy.  CAD -- Is followed by Cardiology. Is due for follow-up in May 2017.  Health Maintenance: Immunizations -- Declines all immunizations. Colonoscopy -- Has had previously due to irritable bowel assessment. Last 2 years ago.   Past Medical History  Diagnosis Date  . Diabetes mellitus     metformin  . Hypertension   . Hyperlipemia   . CAD (coronary artery disease)     cath 11/29/2014 bifurcation Culotte stent to distal RCA and ost PDA treated with 2.75x28 mm Synergy stent, and ost PLA with 2.5x27mm DES, 90% small OM2    Past Surgical History  Procedure Laterality Date  . Tonsillectomy and adenoidectomy    . Retinal detachment surgery    . Broken wrists    . Rhinoplasty    . Cardiac catheterization N/A 11/29/2014    Procedure: Left Heart Cath and Coronary Angiography;  Surgeon: Jettie Booze, MD;  Location: [facility];  Service: Cardiovascular;  Laterality: N/A;  . Cardiac catheterization N/A 11/29/2014    Procedure: Coronary Stent Intervention;  Surgeon: Jettie Booze, MD;  Location: [facility];  Service:  Cardiovascular;  Laterality: N/A;    Current Outpatient Prescriptions on File Prior to Visit  Medication Sig Dispense Refill  . ALPRAZolam (XANAX) 0.5 MG tablet Take 1 tablet (0.5 mg total) by mouth at bedtime as needed for anxiety (TAKE 1/2 TO 1 TAB BY MOUTH 3 TIMES A DAY  AS NEEDED FOR ANXIETY). 90 tablet 1  . amLODipine (NORVASC) 2.5 MG tablet Take 1 tablet (2.5 mg total) by mouth daily. 90 tablet 3  . aspirin 81 MG tablet Take 81 mg by mouth daily.     Marland Kitchen atorvastatin (LIPITOR) 80 MG tablet Take 1 tablet (80 mg total) by mouth daily at 6 PM. 90 tablet 3  . Cinnamon 500 MG TABS Take 3 tablets by mouth daily.      . clopidogrel (PLAVIX) 75 MG tablet Take 300 mg (4 tablets) on the first day then 75 mg (1 tablet) daily there after 34 tablet 6  . ezetimibe (ZETIA) 10 MG tablet Take 1 tablet (10 mg total) by mouth daily. 30 tablet 11  . fenofibrate micronized (ANTARA) 130 MG capsule TAKE 1 CAPSULE (130 MG TOTAL) BY MOUTH DAILY BEFORE BREAKFAST. 30 capsule 5  . losartan (COZAAR) 100 MG tablet Take 100 mg by mouth daily.    . magnesium oxide (MAG-OX) 400 MG tablet Take 400 mg by mouth daily.    . metFORMIN (GLUCOPHAGE) 500 MG tablet TAKE 1 TABLET TWICE A DAY WITH MEALS 60 tablet 6  . metoprolol succinate (TOPROL-XL) 100 MG 24 hr tablet Take 1 tablet (100 mg  total) by mouth daily. Take with or immediately following a meal. 30 tablet 6  . Multiple Vitamin (THERA) TABS Take 1 tablet by mouth daily.    . nitroGLYCERIN (NITROSTAT) 0.4 MG SL tablet Place 1 tablet (0.4 mg total) under the tongue every 5 (five) minutes as needed for chest pain. 25 tablet 3  . Omega-3 Fatty Acids (FISH OIL) 1200 MG CAPS Take 3 capsules by mouth daily.    . pantoprazole (PROTONIX) 40 MG tablet Take 1 tablet (40 mg total) by mouth daily. 30 tablet 3  . sildenafil (VIAGRA) 50 MG tablet Use as directed 8 tablet 1   No current facility-administered medications on file prior to visit.    Allergies  Allergen Reactions  .  Codeine Palpitations    Increases heart rate, rapid heart rate  . Niacin Other (See Comments)    unknown  . Penicillins Other (See Comments)    Does not know what reaction - happened as a child Was told not to take as a child.  . Peanut-Containing Drug Products Other (See Comments)    Pine nuts only.  . Rosuvastatin Other (See Comments)    Night sweats    Family History  Problem Relation Age of Onset  . Colon cancer Neg Hx   . Pancreatic cancer Neg Hx   . Stomach cancer Neg Hx   . Heart disease Father   . Hyperlipidemia Father   . Heart attack Maternal Grandfather 90  . Heart attack Paternal Grandfather 4  . Hypertension Neg Hx   . Stroke Neg Hx     Social History   Social History  . Marital Status: Married    Spouse Name: N/A  . Number of Children: 2  . Years of Education: N/A   Occupational History  . Funeral Director    Social History Main Topics  . Smoking status: Former Smoker    Types: Cigarettes    Quit date: 03/18/1998  . Smokeless tobacco: Never Used     Comment: smoked <1ppd for 15 yrs & quit ~2000...  Marland Kitchen Alcohol Use: No  . Drug Use: No  . Sexual Activity: Yes   Other Topics Concern  . Not on file   Social History Narrative    Review of Systems  Constitutional: Negative for fever and weight loss.  HENT: Negative for ear discharge, ear pain, hearing loss and tinnitus.   Eyes: Negative for blurred vision, double vision, photophobia and pain.  Respiratory: Negative for cough and shortness of breath.   Cardiovascular: Negative for chest pain and palpitations.  Gastrointestinal: Negative for heartburn, nausea, vomiting, abdominal pain, diarrhea, constipation, blood in stool and melena.  Genitourinary: Negative for dysuria, urgency, frequency, hematuria and flank pain.  Musculoskeletal: Negative for falls.  Neurological: Negative for dizziness, loss of consciousness and headaches.  Endo/Heme/Allergies: Negative for environmental allergies.    Psychiatric/Behavioral: Negative for depression, suicidal ideas, hallucinations and substance abuse. The patient is not nervous/anxious and does not have insomnia.     BP 128/88 mmHg  Pulse 81  Temp(Src) 98 F (36.7 C) (Oral)  Resp 16  Ht 5\' 10"  (1.778 m)  Wt 167 lb 8 oz (75.978 kg)  BMI 24.03 kg/m2  SpO2 98%  Physical Exam  Constitutional: He is oriented to person, place, and time and well-developed, well-nourished, and in no distress.  HENT:  Head: Normocephalic and atraumatic.  Right Ear: External ear normal.  Left Ear: External ear normal.  Nose: Nose normal.  Mouth/Throat: Oropharynx is clear and  moist. No oropharyngeal exudate.  Eyes: Conjunctivae and EOM are normal. Pupils are equal, round, and reactive to light.  Neck: Neck supple. No thyromegaly present.  Cardiovascular: Normal rate, regular rhythm, normal heart sounds and intact distal pulses.   Pulmonary/Chest: Effort normal and breath sounds normal. No respiratory distress. He has no wheezes. He has no rales. He exhibits no tenderness.  Abdominal: Soft. Bowel sounds are normal. He exhibits no distension and no mass. There is no tenderness. There is no rebound and no guarding.  Genitourinary: Testes/scrotum normal.  Lymphadenopathy:    He has no cervical adenopathy.  Neurological: He is alert and oriented to person, place, and time.  Skin: Skin is warm and dry. No rash noted.  Psychiatric: Affect normal.  Vitals reviewed.   Diabetic Foot Form - Detailed   Diabetic Foot Exam - detailed  Diabetic Foot exam was performed with the following findings:  Yes 06/05/2015 10:18 AM  Visual Foot Exam completed.:  Yes  Is there a history of foot ulcer?:  No  Can the patient see the bottom of their feet?:  Yes  Are the shoes appropriate in style and fit?:  Yes  Is there swelling or and abnormal foot shape?:  No  Are the toenails long?:  No  Are the toenails thick?:  No  Do you have pain in calf while walking?:  No  Is  there a claw toe deformity?:  No  Is there elevated skin temparature?:  No  Is there limited skin dorsiflexion?:  No  Is there foot or ankle muscle weakness?:  No  Are the toenails ingrown?:  No  Normal Range of Motion:  Yes    Pulse Foot Exam completed.:  Yes  Right posterior Tibialias:  Present Left posterior Tibialias:  Present  Right Dorsalis Pedis:  Present Left Dorsalis Pedis:  Present  Sensory Foot Exam Completed.:  Yes  Swelling:  No  Semmes-Weinstein Monofilament Test  R Foot Test Control:  Neg L Foot Test Control:  Neg  R Site 1-Great Toe:  Neg L Site 1-Great Toe:  Neg  R Site 4:  Neg L Site 4:  Neg  R Site 5:  Neg L Site 5:  Neg        Recent Results (from the past 2160 hour(s))  Lipid panel     Status: Abnormal   Collection Time: 04/19/15 10:15 AM  Result Value Ref Range   Cholesterol 120 (L) 125 - 200 mg/dL   Triglycerides 220 (H) <150 mg/dL   HDL 19 (L) >=40 mg/dL   Total CHOL/HDL Ratio 6.3 (H) <=5.0 Ratio   VLDL 44 (H) <30 mg/dL   LDL Cholesterol 57 <130 mg/dL    Comment:   Total Cholesterol/HDL Ratio:CHD Risk                        Coronary Heart Disease Risk Table                                        Men       Women          1/2 Average Risk              3.4        3.3              Average Risk  5.0        4.4           2X Average Risk              9.6        7.1           3X Average Risk             23.4       11.0 Use the calculated Patient Ratio above and the CHD Risk table  to determine the patient's CHD Risk.   Hepatic function panel     Status: Abnormal   Collection Time: 04/19/15 10:15 AM  Result Value Ref Range   Total Bilirubin 0.5 0.2 - 1.2 mg/dL   Bilirubin, Direct 0.1 <=0.2 mg/dL   Indirect Bilirubin 0.4 0.2 - 1.2 mg/dL   Alkaline Phosphatase 19 (L) 40 - 115 U/L   AST 75 (H) 10 - 40 U/L   ALT 79 (H) 9 - 46 U/L   Total Protein 7.2 6.1 - 8.1 g/dL   Albumin 4.6 3.6 - 5.1 g/dL    Assessment/Plan: Visit for preventive  health examination Depression screen negative. Health Maintenance reviewed -- Declines immunizations. Preventive schedule discussed and handout given in AVS. Will obtain fasting labs today.   Pre-diabetes Will repeat A1C and BMP today. Continue Metformin for now. Diet and exercise reviewed. Foot exam updated and within normal limits.  HLD (hyperlipidemia) Will check lipid panel and LFTs today. Continue medications as directed. Follow-up with Cardiology as scheduled.  Essential hypertension Well-controlled. Asymptomatic.

## 2015-06-05 NOTE — Assessment & Plan Note (Signed)
Will check lipid panel and LFTs today. Continue medications as directed. Follow-up with Cardiology as scheduled.

## 2015-06-05 NOTE — Progress Notes (Signed)
Pre visit review using our clinic review tool, if applicable. No additional management support is needed unless otherwise documented below in the visit note. 

## 2015-06-05 NOTE — Patient Instructions (Signed)
Please go to the lab for blood work.  I will call you with your results. If your blood work is normal we will follow-up yearly for physicals and every 6 months for blood pressure and sugar.  Follow-up with [name] as scheduled. Follow-up with me in 6 months. Return sooner if needed.  Preventive Care for Adults, Male A healthy lifestyle and preventive care can promote health and wellness. Preventive health guidelines for men include the following key practices:  A routine yearly physical is a good way to check with your health care provider about your health and preventative screening. It is a chance to share any concerns and updates on your health and to receive a thorough exam.  Visit your dentist for a routine exam and preventative care every 6 months. Brush your teeth twice a day and floss once a day. Good oral hygiene prevents tooth decay and gum disease.  The frequency of eye exams is based on your age, health, family medical history, use of contact lenses, and other factors. Follow your health care provider's recommendations for frequency of eye exams.  Eat a healthy diet. Foods such as vegetables, fruits, whole grains, low-fat dairy products, and lean protein foods contain the nutrients you need without too many calories. Decrease your intake of foods high in solid fats, added sugars, and salt. Eat the right amount of calories for you.Get information about a proper diet from your health care provider, if necessary.  Regular physical exercise is one of the most important things you can do for your health. Most adults should get at least 150 minutes of moderate-intensity exercise (any activity that increases your heart rate and causes you to sweat) each week. In addition, most adults need muscle-strengthening exercises on 2 or more days a week.  Maintain a healthy weight. The body mass index (BMI) is a screening tool to identify possible weight problems. It provides an estimate of body  fat based on height and weight. Your health care provider can find your BMI and can help you achieve or maintain a healthy weight.For adults 20 years and older:  A BMI below 18.5 is considered underweight.  A BMI of 18.5 to 24.9 is normal.  A BMI of 25 to 29.9 is considered overweight.  A BMI of 30 and above is considered obese.  Maintain normal blood lipids and cholesterol levels by exercising and minimizing your intake of saturated fat. Eat a balanced diet with plenty of fruit and vegetables. Blood tests for lipids and cholesterol should begin at age 44 and be repeated every 5 years. If your lipid or cholesterol levels are high, you are over 50, or you are at high risk for heart disease, you may need your cholesterol levels checked more frequently.Ongoing high lipid and cholesterol levels should be treated with medicines if diet and exercise are not working.  If you smoke, find out from your health care provider how to quit. If you do not use tobacco, do not start.  Lung cancer screening is recommended for adults aged 43-80 years who are at high risk for developing lung cancer because of a history of smoking. A yearly low-dose CT scan of the lungs is recommended for people who have at least a 30-pack-year history of smoking and are a current smoker or have quit within the past 15 years. A pack year of smoking is smoking an average of 1 pack of cigarettes a day for 1 year (for example: 1 pack a day for 30 years  or 2 packs a day for 15 years). Yearly screening should continue until the smoker has stopped smoking for at least 15 years. Yearly screening should be stopped for people who develop a health problem that would prevent them from having lung cancer treatment.  If you choose to drink alcohol, do not have more than 2 drinks per day. One drink is considered to be 12 ounces (355 mL) of beer, 5 ounces (148 mL) of wine, or 1.5 ounces (44 mL) of liquor.  Avoid use of street drugs. Do not share  needles with anyone. Ask for help if you need support or instructions about stopping the use of drugs.  High blood pressure causes heart disease and increases the risk of stroke. Your blood pressure should be checked at least every 1-2 years. Ongoing high blood pressure should be treated with medicines, if weight loss and exercise are not effective.  If you are 54-67 years old, ask your health care provider if you should take aspirin to prevent heart disease.  Diabetes screening is done by taking a blood sample to check your blood glucose level after you have not eaten for a certain period of time (fasting). If you are not overweight and you do not have risk factors for diabetes, you should be screened once every 3 years starting at age 75. If you are overweight or obese and you are 11-22 years of age, you should be screened for diabetes every year as part of your cardiovascular risk assessment.  Colorectal cancer can be detected and often prevented. Most routine colorectal cancer screening begins at the age of 45 and continues through age 47. However, your health care provider may recommend screening at an earlier age if you have risk factors for colon cancer. On a yearly basis, your health care provider may provide home test kits to check for hidden blood in the stool. Use of a small camera at the end of a tube to directly examine the colon (sigmoidoscopy or colonoscopy) can detect the earliest forms of colorectal cancer. Talk to your health care provider about this at age 37, when routine screening begins. Direct exam of the colon should be repeated every 5-10 years through age 106, unless early forms of precancerous polyps or small growths are found.  People who are at an increased risk for hepatitis B should be screened for this virus. You are considered at high risk for hepatitis B if:  You were born in a country where hepatitis B occurs often. Talk with your health care provider about which  countries are considered high risk.  Your parents were born in a high-risk country and you have not received a shot to protect against hepatitis B (hepatitis B vaccine).  You have HIV or AIDS.  You use needles to inject street drugs.  You live with, or have sex with, someone who has hepatitis B.  You are a man who has sex with other men (MSM).  You get hemodialysis treatment.  You take certain medicines for conditions such as cancer, organ transplantation, and autoimmune conditions.  Hepatitis C blood testing is recommended for all people born from [birth year] through 1965 and any individual with known risks for hepatitis C.  Practice safe sex. Use condoms and avoid high-risk sexual practices to reduce the spread of sexually transmitted infections (STIs). STIs include gonorrhea, chlamydia, syphilis, trichomonas, herpes, HPV, and human immunodeficiency virus (HIV). Herpes, HIV, and HPV are viral illnesses that have no cure. They can result in disability, cancer,  and death.  If you are a man who has sex with other men, you should be screened at least once per year for:  HIV.  Urethral, rectal, and pharyngeal infection of gonorrhea, chlamydia, or both.  If you are at risk of being infected with HIV, it is recommended that you take a prescription medicine daily to prevent HIV infection. This is called preexposure prophylaxis (PrEP). You are considered at risk if:  You are a man who has sex with other men (MSM) and have other risk factors.  You are a heterosexual man, are sexually active, and are at increased risk for HIV infection.  You take drugs by injection.  You are sexually active with a partner who has HIV.  Talk with your health care provider about whether you are at high risk of being infected with HIV. If you choose to begin PrEP, you should first be tested for HIV. You should then be tested every 3 months for as long as you are taking PrEP.  A one-time screening for abdominal  aortic aneurysm (AAA) and surgical repair of large AAAs by ultrasound are recommended for men ages 22 to 81 years who are current or former smokers.  Healthy men should no longer receive prostate-specific antigen (PSA) blood tests as part of routine cancer screening. Talk with your health care provider about prostate cancer screening.  Testicular cancer screening is not recommended for adult males who have no symptoms. Screening includes self-exam, a health care provider exam, and other screening tests. Consult with your health care provider about any symptoms you have or any concerns you have about testicular cancer.  Use sunscreen. Apply sunscreen liberally and repeatedly throughout the day. You should seek shade when your shadow is shorter than you. Protect yourself by wearing long sleeves, pants, a wide-brimmed hat, and sunglasses year round, whenever you are outdoors.  Once a month, do a whole-body skin exam, using a mirror to look at the skin on your back. Tell your health care provider about new moles, moles that have irregular borders, moles that are larger than a pencil eraser, or moles that have changed in shape or color.  Stay current with required vaccines (immunizations).  Influenza vaccine. All adults should be immunized every year.  Tetanus, diphtheria, and acellular pertussis (Td, Tdap) vaccine. An adult who has not previously received Tdap or who does not know his vaccine status should receive 1 dose of Tdap. This initial dose should be followed by tetanus and diphtheria toxoids (Td) booster doses every 10 years. Adults with an unknown or incomplete history of completing a 3-dose immunization series with Td-containing vaccines should begin or complete a primary immunization series including a Tdap dose. Adults should receive a Td booster every 10 years.  Varicella vaccine. An adult without evidence of immunity to varicella should receive 2 doses or a second dose if he has previously  received 1 dose.  Human papillomavirus (HPV) vaccine. Males aged 11-21 years who have not received the vaccine previously should receive the 3-dose series. Males aged 22-26 years may be immunized. Immunization is recommended through the age of 59 years for any male who has sex with males and did not get any or all doses earlier. Immunization is recommended for any person with an immunocompromised condition through the age of 44 years if he did not get any or all doses earlier. During the 3-dose series, the second dose should be obtained 4-8 weeks after the first dose. The third dose should be obtained  24 weeks after the first dose and 16 weeks after the second dose.  Zoster vaccine. One dose is recommended for adults aged 46 years or older unless certain conditions are present.  Measles, mumps, and rubella (MMR) vaccine. Adults born before [birth year] generally are considered immune to measles and mumps. Adults born in [birth year] or later should have 1 or more doses of MMR vaccine unless there is a contraindication to the vaccine or there is laboratory evidence of immunity to each of the three diseases. A routine second dose of MMR vaccine should be obtained at least 28 days after the first dose for students attending postsecondary schools, health care workers, or international travelers. People who received inactivated measles vaccine or an unknown type of measles vaccine during 1963-1967 should receive 2 doses of MMR vaccine. People who received inactivated mumps vaccine or an unknown type of mumps vaccine before 1979 and are at high risk for mumps infection should consider immunization with 2 doses of MMR vaccine. Unvaccinated health care workers born before [birth year] who lack laboratory evidence of measles, mumps, or rubella immunity or laboratory confirmation of disease should consider measles and mumps immunization with 2 doses of MMR vaccine or rubella immunization with 1 dose of MMR vaccine.  Pneumococcal 13-valent  conjugate (PCV13) vaccine. When indicated, a person who is uncertain of his immunization history and has no record of immunization should receive the PCV13 vaccine. All adults 67 years of age and older should receive this vaccine. An adult aged 28 years or older who has certain medical conditions and has not been previously immunized should receive 1 dose of PCV13 vaccine. This PCV13 should be followed with a dose of pneumococcal polysaccharide (PPSV23) vaccine. Adults who are at high risk for pneumococcal disease should obtain the PPSV23 vaccine at least 8 weeks after the dose of PCV13 vaccine. Adults older than 49 years of age who have normal immune system function should obtain the PPSV23 vaccine dose at least 1 year after the dose of PCV13 vaccine.  Pneumococcal polysaccharide (PPSV23) vaccine. When PCV13 is also indicated, PCV13 should be obtained first. All adults aged 49 years and older should be immunized. An adult younger than age 33 years who has certain medical conditions should be immunized. Any person who resides in a nursing home or long-term care facility should be immunized. An adult smoker should be immunized. People with an immunocompromised condition and certain other conditions should receive both PCV13 and PPSV23 vaccines. People with human immunodeficiency virus (HIV) infection should be immunized as soon as possible after diagnosis. Immunization during chemotherapy or radiation therapy should be avoided. Routine use of PPSV23 vaccine is not recommended for American Indians, [place] Natives, or people younger than 65 years unless there are medical conditions that require PPSV23 vaccine. When indicated, people who have unknown immunization and have no record of immunization should receive PPSV23 vaccine. One-time revaccination 5 years after the first dose of PPSV23 is recommended for people aged 19-64 years who have chronic kidney failure, nephrotic syndrome, asplenia, or immunocompromised  conditions. People who received 1-2 doses of PPSV23 before age 38 years should receive another dose of PPSV23 vaccine at age 91 years or later if at least 5 years have passed since the previous dose. Doses of PPSV23 are not needed for people immunized with PPSV23 at or after age 59 years.  Meningococcal vaccine. Adults with asplenia or persistent complement component deficiencies should receive 2 doses of quadrivalent meningococcal conjugate (MenACWY-D) vaccine. The doses should be obtained at  least 2 months apart. Microbiologists working with certain meningococcal bacteria, [place] recruits, people at risk during an outbreak, and people who travel to or live in countries with a high rate of meningitis should be immunized. A first-year college student up through age 64 years who is living in a residence hall should receive a dose if he did not receive a dose on or after his 16th birthday. Adults who have certain high-risk conditions should receive one or more doses of vaccine.  Hepatitis A vaccine. Adults who wish to be protected from this disease, have chronic liver disease, work with hepatitis A-infected animals, work in hepatitis A research labs, or travel to or work in countries with a high rate of hepatitis A should be immunized. Adults who were previously unvaccinated and who anticipate close contact with an international adoptee during the first 60 days after arrival in the [place] from a country with a high rate of hepatitis A should be immunized.  Hepatitis B vaccine. Adults should be immunized if they wish to be protected from this disease, are under age 79 years and have diabetes, have chronic liver disease, have had more than one sex partner in the past 6 months, may be exposed to blood or other infectious body fluids, are household contacts or sex partners of hepatitis B positive people, are clients or workers in certain care facilities, or travel to or work in countries with a high rate of  hepatitis B.  Haemophilus influenzae type b (Hib) vaccine. A previously unvaccinated person with asplenia or sickle cell disease or having a scheduled splenectomy should receive 1 dose of Hib vaccine. Regardless of previous immunization, a recipient of a hematopoietic stem cell transplant should receive a 3-dose series 6-12 months after his successful transplant. Hib vaccine is not recommended for adults with HIV infection. Preventive Service / Frequency Ages 28 to 69  Blood pressure check.** / Every 3-5 years.  Lipid and cholesterol check.** / Every 5 years beginning at age 53.  Hepatitis C blood test.** / For any individual with known risks for hepatitis C.  Skin self-exam. / Monthly.  Influenza vaccine. / Every year.  Tetanus, diphtheria, and acellular pertussis (Tdap, Td) vaccine.** / Consult your health care provider. 1 dose of Td every 10 years.  Varicella vaccine.** / Consult your health care provider.  HPV vaccine. / 3 doses over 6 months, if 58 or younger.  Measles, mumps, rubella (MMR) vaccine.** / You need at least 1 dose of MMR if you were born in 1957 or later. You may also need a second dose.  Pneumococcal 13-valent conjugate (PCV13) vaccine.** / Consult your health care provider.  Pneumococcal polysaccharide (PPSV23) vaccine.** / 1 to 2 doses if you smoke cigarettes or if you have certain conditions.  Meningococcal vaccine.** / 1 dose if you are age 5 to 29 years and a [employer] researcher living in a residence hall, or have one of several medical conditions. You may also need additional booster doses.  Hepatitis A vaccine.** / Consult your health care provider.  Hepatitis B vaccine.** / Consult your health care provider.  Haemophilus influenzae type b (Hib) vaccine.** / Consult your health care provider. Ages 70 to 80  Blood pressure check.** / Every year.  Lipid and cholesterol check.** / Every 5 years beginning at age 45.  Lung cancer screening. /  Every year if you are aged 36-80 years and have a 30-pack-year history of smoking and currently smoke or have quit within the past 15  years. Yearly screening is stopped once you have quit smoking for at least 15 years or develop a health problem that would prevent you from having lung cancer treatment.  Fecal occult blood test (FOBT) of stool. / Every year beginning at age 77 and continuing until age 14. You may not have to do this test if you get a colonoscopy every 10 years.  Flexible sigmoidoscopy** or colonoscopy.** / Every 5 years for a flexible sigmoidoscopy or every 10 years for a colonoscopy beginning at age 28 and continuing until age 46.  Hepatitis C blood test.** / For all people born from [birth year] through 1965 and any individual with known risks for hepatitis C.  Skin self-exam. / Monthly.  Influenza vaccine. / Every year.  Tetanus, diphtheria, and acellular pertussis (Tdap/Td) vaccine.** / Consult your health care provider. 1 dose of Td every 10 years.  Varicella vaccine.** / Consult your health care provider.  Zoster vaccine.** / 1 dose for adults aged 70 years or older.  Measles, mumps, rubella (MMR) vaccine.** / You need at least 1 dose of MMR if you were born in 1957 or later. You may also need a second dose.  Pneumococcal 13-valent conjugate (PCV13) vaccine.** / Consult your health care provider.  Pneumococcal polysaccharide (PPSV23) vaccine.** / 1 to 2 doses if you smoke cigarettes or if you have certain conditions.  Meningococcal vaccine.** / Consult your health care provider.  Hepatitis A vaccine.** / Consult your health care provider.  Hepatitis B vaccine.** / Consult your health care provider.  Haemophilus influenzae type b (Hib) vaccine.** / Consult your health care provider. Ages 10 and over  Blood pressure check.** / Every year.  Lipid and cholesterol check.**/ Every 5 years beginning at age 70.  Lung cancer screening. / Every year if you are aged 74-80  years and have a 30-pack-year history of smoking and currently smoke or have quit within the past 15 years. Yearly screening is stopped once you have quit smoking for at least 15 years or develop a health problem that would prevent you from having lung cancer treatment.  Fecal occult blood test (FOBT) of stool. / Every year beginning at age 3 and continuing until age 75. You may not have to do this test if you get a colonoscopy every 10 years.  Flexible sigmoidoscopy** or colonoscopy.** / Every 5 years for a flexible sigmoidoscopy or every 10 years for a colonoscopy beginning at age 65 and continuing until age 89.  Hepatitis C blood test.** / For all people born from [birth year] through 1963 and any individual with known risks for hepatitis C.  Abdominal aortic aneurysm (AAA) screening.** / A one-time screening for ages 63 to 25 years who are current or former smokers.  Skin self-exam. / Monthly.  Influenza vaccine. / Every year.  Tetanus, diphtheria, and acellular pertussis (Tdap/Td) vaccine.** / 1 dose of Td every 10 years.  Varicella vaccine.** / Consult your health care provider.  Zoster vaccine.** / 1 dose for adults aged 107 years or older.  Pneumococcal 13-valent conjugate (PCV13) vaccine.** / 1 dose for all adults aged 85 years and older.  Pneumococcal polysaccharide (PPSV23) vaccine.** / 1 dose for all adults aged 77 years and older.  Meningococcal vaccine.** / Consult your health care provider.  Hepatitis A vaccine.** / Consult your health care provider.  Hepatitis B vaccine.** / Consult your health care provider.  Haemophilus influenzae type b (Hib) vaccine.** / Consult your health care provider. **Family history and personal history of risk and  conditions may change your health care provider's recommendations.   This information is not intended to replace advice given to you by your health care provider. Make sure you discuss any questions you have with your health care  provider.   Document Released: 04/30/2001 Document Revised: 03/25/2014 Document Reviewed: 07/30/2010 Elsevier Interactive Patient Education [employer].

## 2015-06-05 NOTE — Assessment & Plan Note (Signed)
Depression screen negative. Health Maintenance reviewed -- Declines immunizations. Preventive schedule discussed and handout given in AVS. Will obtain fasting labs today.

## 2015-06-13 ENCOUNTER — Other Ambulatory Visit: Payer: Managed Care, Other (non HMO)

## 2015-07-01 ENCOUNTER — Other Ambulatory Visit: Payer: Self-pay | Admitting: Physician Assistant

## 2015-08-01 ENCOUNTER — Other Ambulatory Visit: Payer: Self-pay | Admitting: Physician Assistant

## 2015-08-01 MED ORDER — FENOFIBRATE MICRONIZED 130 MG PO CAPS: ORAL_CAPSULE | ORAL | Status: DC

## 2015-08-01 NOTE — Telephone Encounter (Signed)
Patient has not filled Fenofibrate since 2014, and there is also Drug-Drug Interaction/SLS Please Advise.  Medication Detail      Disp Refills Start End     fenofibrate micronized (ANTARA) 130 MG capsule 30 capsule 5 10/16/2012     Sig: TAKE 1 CAPSULE (130 MG TOTAL) BY MOUTH DAILY BEFORE BREAKFAST.    E-Prescribing Status: Receipt confirmed by pharmacy (10/16/2012 4:41 PM EDT)     Fibric Acid Derivatives / Atorvastatin Significance: Major Warning: Myopathy is listed in official package labeling as a possibility when fenofibrate micronized and atorvastatin are coadministered. Onset: Delayed Document Level: Possible Interacting Medications/Orders:  Fibric Acid Derivatives Oral, Systemic Atorvastatin Oral, Systemic  1. fenofibrate micronized Order: fenofibrate micronized (ANTARA) 130 MG capsule Route: none Start: 08/01/2015 End: none Frequency:  1. atorvastatin Order ([id number]): atorvastatin (LIPITOR) 80 MG tablet Route: Oral Start: 12/01/2014 End: none Frequency: Daily-1800   Management Code: Professional review suggested Effects: Myopathy is listed in official package labeling as a possibility when fenofibrate micronized and atorvastatin are coadministered.

## 2015-08-01 NOTE — Telephone Encounter (Signed)
Pt called in because he says that he need a refill on his medication fenofibrate micronized. Pt says that the Rx doesn't reflect PCP name because it was last filled when he was being seen by his old PCP.    CB: [phone]  Pharmacy: [id number] - [place], [place]

## 2015-08-02 NOTE — Telephone Encounter (Signed)
I have taken care of refill.

## 2015-08-05 ENCOUNTER — Other Ambulatory Visit: Payer: Self-pay | Admitting: Physician Assistant

## 2015-08-17 ENCOUNTER — Ambulatory Visit (INDEPENDENT_AMBULATORY_CARE_PROVIDER_SITE_OTHER): Payer: Managed Care, Other (non HMO) | Admitting: Interventional Cardiology

## 2015-08-17 ENCOUNTER — Encounter: Payer: Self-pay | Admitting: Interventional Cardiology

## 2015-08-17 VITALS — BP 110/80 | HR 80 | Ht 70.0 in | Wt 167.0 lb

## 2015-08-17 DIAGNOSIS — I2510 Atherosclerotic heart disease of native coronary artery without angina pectoris: Secondary | ICD-10-CM

## 2015-08-17 DIAGNOSIS — I252 Old myocardial infarction: Secondary | ICD-10-CM | POA: Diagnosis not present

## 2015-08-17 DIAGNOSIS — I10 Essential (primary) hypertension: Secondary | ICD-10-CM | POA: Diagnosis not present

## 2015-08-17 DIAGNOSIS — E785 Hyperlipidemia, unspecified: Secondary | ICD-10-CM

## 2015-08-17 NOTE — Patient Instructions (Addendum)
Medication Instructions:  Same-no changes  Labwork: Lipids and CMET in September 2017-please do not eat or drink after midnight the night before labs are drawn.  Testing/Procedures: None  Follow-Up: In November of 2017   Per Dr Irish Lack you are no longer on a weight restriction.     If you need a refill on your cardiac medications before your next appointment, please call your pharmacy.

## 2015-08-17 NOTE — Progress Notes (Signed)
Cardiology Office Note   Date:  08/17/2015   ID:  Carlos Montgomery, DOB 1965/03/20, MRN [id number]  PCP:  Leeanne Rio, PA-C    No chief complaint on file. Follow-up MI/CAD   Wt Readings from Last 3 Encounters:  08/17/15 167 lb (75.751 kg)  06/05/15 167 lb 8 oz (75.978 kg)  05/05/15 168 lb 9.6 oz (76.476 kg)       History of Present Illness: Carlos Montgomery is a 50 y.o. male  Who had inferior MI in 9/16. He thinks the DOE was building prior to this. He received 2 stents to the RCA. He did well with the procedure.  He has been exercising. He does cardio type exercises.He has been maintaining the weight loss he had after his MI. He has not used nitroglycerin. He denies any bleeding problems. He has not had similar symptoms like his prior angina. Overall, he is doing quite well. He has fully active at work. He does think they sometimes ask him to lift too much. 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 He wants to know if there are any restrictions on him.      Past Medical History  Diagnosis Date  . Diabetes mellitus     metformin  . Hypertension   . Hyperlipemia   . CAD (coronary artery disease)     cath 11/29/2014 bifurcation Culotte stent to distal RCA and ost PDA treated with 2.75x28 mm Synergy stent, and ost PLA with 2.5x49mm DES, 90% small OM2    Past Surgical History  Procedure Laterality Date  . Tonsillectomy and adenoidectomy    . Retinal detachment surgery    . Broken wrists    . Rhinoplasty    . Cardiac catheterization N/A 11/29/2014    Procedure: Left Heart Cath and Coronary Angiography;  Surgeon: Jettie Booze, MD;  Location: [facility];  Service: Cardiovascular;  Laterality: N/A;  . Cardiac catheterization N/A 11/29/2014    Procedure: Coronary Stent Intervention;  Surgeon: Jettie Booze, MD;  Location: [facility];  Service: Cardiovascular;  Laterality: N/A;     Current Outpatient Prescriptions  Medication Sig  Dispense Refill  . ALPRAZolam (XANAX) 0.5 MG tablet Take 1 tablet (0.5 mg total) by mouth at bedtime as needed for anxiety (TAKE 1/2 TO 1 TAB BY MOUTH 3 TIMES A DAY  AS NEEDED FOR ANXIETY). 90 tablet 1  . amLODipine (NORVASC) 2.5 MG tablet Take 1 tablet (2.5 mg total) by mouth daily. 90 tablet 3  . aspirin 81 MG tablet Take 81 mg by mouth daily.     Marland Kitchen atorvastatin (LIPITOR) 80 MG tablet Take 1 tablet (80 mg total) by mouth daily at 6 PM. 90 tablet 3  . Cinnamon 500 MG TABS Take 3 tablets by mouth daily.      . clopidogrel (PLAVIX) 75 MG tablet Take 300 mg (4 tablets) on the first day then 75 mg (1 tablet) daily there after 34 tablet 6  . ezetimibe (ZETIA) 10 MG tablet Take 1 tablet (10 mg total) by mouth daily. 30 tablet 11  . fenofibrate micronized (ANTARA) 130 MG capsule TAKE 1 CAPSULE (130 MG TOTAL) BY MOUTH DAILY BEFORE BREAKFAST. 30 capsule 5  . losartan (COZAAR) 100 MG tablet Take 100 mg by mouth daily.    . magnesium oxide (MAG-OX) 400 MG tablet Take 400 mg by mouth daily.    . metFORMIN (GLUCOPHAGE) 500 MG tablet TAKE 1 TABLET BY MOUTH TWICE  A DAY WITH A MEAL (CALL FOR APPT) 60 tablet 5  . metoprolol succinate (TOPROL-XL) 100 MG 24 hr tablet Take 1 tablet (100 mg total) by mouth daily. Take with or immediately following a meal. 30 tablet 6  . Multiple Vitamin (THERA) TABS Take 1 tablet by mouth daily.    . nitroGLYCERIN (NITROSTAT) 0.4 MG SL tablet Place 0.4 mg under the tongue every 5 (five) minutes as needed for chest pain (X3 DOSES BEFORE CALLING 911).    . Omega-3 Fatty Acids (FISH OIL) 1200 MG CAPS Take 3 capsules by mouth daily.    . pantoprazole (PROTONIX) 40 MG tablet Take 1 tablet (40 mg total) by mouth daily. 30 tablet 3  . sildenafil (VIAGRA) 50 MG tablet Use as directed 8 tablet 1   No current facility-administered medications for this visit.    Allergies:   Peanut-containing drug products; Codeine; Niacin; Penicillins; and Rosuvastatin    Social History:  The patient   reports that he quit smoking about 17 years ago. His smoking use included Cigarettes. He has never used smokeless tobacco. He reports that he does not drink alcohol or use illicit drugs.   Family History:  The patient's family history includes Heart attack (age of onset: 2) in his paternal grandfather; Heart attack (age of onset: 65) in his maternal grandfather; Heart disease in his father; Hyperlipidemia in his father. There is no history of Colon cancer, Pancreatic cancer, Stomach cancer, Hypertension, or Stroke.    ROS:  Please see the history of present illness.   Otherwise, review of systems are positive for no bleeding issues; maintatining weight loss.   All other systems are reviewed and negative.    PHYSICAL EXAM: VS:  BP 110/80 mmHg  Pulse 80  Ht 5\' 10"  (1.778 m)  Wt 167 lb (75.751 kg)  BMI 23.96 kg/m2 , BMI Body mass index is 23.96 kg/(m^2). GEN: Well nourished, well developed, in no acute distress HEENT: normal Neck: no JVD, carotid bruits, or masses Cardiac: RRR; no murmurs, rubs, or gallops,no edema  Respiratory:  clear to auscultation bilaterally, normal work of breathing GI: soft, nontender, nondistended, + BS MS: no deformity or atrophy Skin: warm and dry, no rash Neuro:  Strength and sensation are intact Psych: euthymic mood, full affect    Recent Labs: 06/05/2015: ALT 57*; BUN 12; Creatinine, Ser 1.33; Hemoglobin 12.2*; Platelets 193.0; Potassium 4.4; Sodium 138; TSH 1.43   Lipid Panel    Component Value Date/Time   CHOL 131 06/05/2015 1023   TRIG 292.0* 06/05/2015 1023   HDL 26.50* 06/05/2015 1023   CHOLHDL 5 06/05/2015 1023   VLDL 58.4* 06/05/2015 1023   LDLCALC 57 04/19/2015 1015   LDLDIRECT 79.0 06/05/2015 1023     Other studies Reviewed: Additional studies/ records that were reviewed today with results demonstrating: cath records reviewed.   ASSESSMENT AND PLAN:  1. Old MI/CAD:  No angina. Continue aggressive secondary prevention. Continue dual  antiplatelet therapy until at least September 2017. He will likely continue clopidogrel beyond that due to the bifurcation stenting he had during his acute MI. 2. Hyperlipidemia: Triglycerides are still been elevated. LDL controlled. Avastin continue lifestyle modifications and try to improve his diet. We'll recheck his lipids in September. 3. Hypertension: Blood pressure well controlled. Continue current medicines. No signs of hypotension. 4. No lifting restriction placed on them for his heart. I did ask him to take care of himself from an orthopedic standpoint and avoid injury.   Current medicines are reviewed  at length with the patient today.  The patient concerns regarding his medicines were addressed.  The following changes have been made:  No change   Labs/ tests ordered today include:  No orders of the defined types were placed in this encounter.    Recommend 150 minutes/week of aerobic exercise Low fat, low carb, high fiber diet recommended  Disposition:   FU in 5 months   Signed, Larae Grooms, MD  08/17/2015 11:17 AM    [facility] [address], [place], [place]  [address] Phone: [phone]; Fax: [phone]

## 2015-09-18 ENCOUNTER — Telehealth: Payer: Self-pay | Admitting: *Deleted

## 2015-09-18 MED ORDER — LOSARTAN POTASSIUM 100 MG PO TABS: 100.0000 mg | ORAL_TABLET | Freq: Every day | ORAL | Status: DC

## 2015-09-18 NOTE — Telephone Encounter (Signed)
Rx request to pharmacy/SLS  

## 2015-10-26 ENCOUNTER — Other Ambulatory Visit: Payer: Self-pay | Admitting: Physician Assistant

## 2015-10-26 NOTE — Telephone Encounter (Signed)
Review for refill. 

## 2015-11-04 ENCOUNTER — Other Ambulatory Visit: Payer: Self-pay | Admitting: Physician Assistant

## 2015-11-06 NOTE — Telephone Encounter (Signed)
eScribe request from [facility] for refill on Alprazolam 0.5mg   Last filled - 05/05/15, #90x1 Last AEX - 06/05/15 Next AEX - 6-Mths Please Advise on refills/SLS 08/21

## 2015-11-08 NOTE — Telephone Encounter (Signed)
Rx faxed to pharmacy/SLS 08/23

## 2015-11-23 ENCOUNTER — Other Ambulatory Visit: Payer: Self-pay | Admitting: Interventional Cardiology

## 2015-12-04 ENCOUNTER — Other Ambulatory Visit: Payer: Managed Care, Other (non HMO)

## 2015-12-06 ENCOUNTER — Other Ambulatory Visit: Payer: Managed Care, Other (non HMO) | Admitting: *Deleted

## 2015-12-06 ENCOUNTER — Ambulatory Visit (INDEPENDENT_AMBULATORY_CARE_PROVIDER_SITE_OTHER): Payer: Managed Care, Other (non HMO) | Admitting: Physician Assistant

## 2015-12-06 ENCOUNTER — Encounter: Payer: Self-pay | Admitting: Physician Assistant

## 2015-12-06 VITALS — BP 118/76 | HR 82 | Temp 98.6°F | Resp 16 | Ht 70.0 in | Wt 166.1 lb

## 2015-12-06 DIAGNOSIS — E785 Hyperlipidemia, unspecified: Secondary | ICD-10-CM

## 2015-12-06 DIAGNOSIS — I10 Essential (primary) hypertension: Secondary | ICD-10-CM

## 2015-12-06 DIAGNOSIS — R7303 Prediabetes: Secondary | ICD-10-CM

## 2015-12-06 LAB — LIPID PANEL
CHOLESTEROL: 129 mg/dL (ref 125–200)
HDL: 29 mg/dL — AB (ref >=40)
LDL CALC: 68 mg/dL (ref <130)
Total CHOL/HDL Ratio: 4.4 Ratio (ref <=5.0)
Triglycerides: 162 mg/dL — ABNORMAL HIGH (ref <150)
VLDL: 32 mg/dL — ABNORMAL HIGH (ref <30)

## 2015-12-06 LAB — COMPREHENSIVE METABOLIC PANEL
ALT: 60 U/L — ABNORMAL HIGH (ref 9–46)
AST: 60 U/L — ABNORMAL HIGH (ref 10–40)
Albumin: 4.7 g/dL (ref 3.6–5.1)
Alkaline Phosphatase: 24 U/L — ABNORMAL LOW (ref 40–115)
BUN: 13 mg/dL (ref 7–25)
CHLORIDE: 106 mmol/L (ref 98–110)
CO2: 25 mmol/L (ref 20–31)
CREATININE: 1.47 mg/dL — AB (ref 0.60–1.35)
Calcium: 9.5 mg/dL (ref 8.6–10.3)
GLUCOSE: 79 mg/dL (ref 65–99)
POTASSIUM: 4.7 mmol/L (ref 3.5–5.3)
SODIUM: 140 mmol/L (ref 135–146)
Total Bilirubin: 0.6 mg/dL (ref 0.2–1.2)
Total Protein: 7.3 g/dL (ref 6.1–8.1)

## 2015-12-06 NOTE — Progress Notes (Signed)
Pre visit review using our clinic review tool, if applicable. No additional management support is needed unless otherwise documented below in the visit note/SLS  

## 2015-12-06 NOTE — Progress Notes (Signed)
Patient with history of CAD presents to clinic today for follow-up of hypertension and pre-diabetes.   Hypertension -- Patient is currently on regimen on Amlodipine 2.5 mg daily, Losartan 100 mg daily, and TOprol XL 100 mg daily. Is also taking Plavix per Cardiology. Endorses taking medications as directed. Patient denies chest pain, palpitations, lightheadedness, dizziness, vision changes or frequent headaches.  BP Readings from Last 3 Encounters:  12/06/15 118/76  08/17/15 110/80  06/05/15 128/88   Pre-Diabetes -- Last A1C had risen just into diabetic range at 6.6. Patient was to work on diet and exercise. Patient endorses working out a few times per week. Endorses well-balanced diet overall. Is taking his Metformin 500 mg BID as directed. Is not checking fasting sugars.    Past Medical History:  Diagnosis Date  . CAD (coronary artery disease)    cath 11/29/2014 bifurcation Culotte stent to distal RCA and ost PDA treated with 2.75x28 mm Synergy stent, and ost PLA with 2.5x37m DES, 90% small OM2  . Diabetes mellitus    metformin  . Hyperlipemia   . Hypertension     Current Outpatient Prescriptions on File Prior to Visit  Medication Sig Dispense Refill  . ALPRAZolam (XANAX) 0.5 MG tablet TAKE 1/2 TO 1 TABLET BY MOUTH 3 TIMES DAILY AS NEEDED FOR ANXIETY 90 tablet 1  . amLODipine (NORVASC) 2.5 MG tablet TAKE 1 TABLET (2.5 MG TOTAL) BY MOUTH DAILY. 90 tablet 2  . aspirin 81 MG tablet Take 81 mg by mouth daily.     .Marland Kitchenatorvastatin (LIPITOR) 80 MG tablet TAKE 1 TABLET (80 MG TOTAL) BY MOUTH DAILY AT 6 PM. 90 tablet 2  . Cinnamon 500 MG TABS Take 3 tablets by mouth daily.      . clopidogrel (PLAVIX) 75 MG tablet Take 1 tablet (75 mg total) by mouth daily. 30 tablet 8  . ezetimibe (ZETIA) 10 MG tablet Take 1 tablet (10 mg total) by mouth daily. 30 tablet 11  . fenofibrate micronized (ANTARA) 130 MG capsule TAKE 1 CAPSULE (130 MG TOTAL) BY MOUTH DAILY BEFORE BREAKFAST. 30 capsule 5  .  losartan (COZAAR) 100 MG tablet Take 1 tablet (100 mg total) by mouth daily. 30 tablet 2  . magnesium oxide (MAG-OX) 400 MG tablet Take 400 mg by mouth daily.    . metFORMIN (GLUCOPHAGE) 500 MG tablet TAKE 1 TABLET BY MOUTH TWICE A DAY WITH A MEAL (CALL FOR APPT) 60 tablet 5  . metoprolol succinate (TOPROL-XL) 100 MG 24 hr tablet Take 1 tablet (100 mg total) by mouth daily. Take with or immediately following a meal. 30 tablet 6  . Multiple Vitamin (THERA) TABS Take 1 tablet by mouth daily.    . nitroGLYCERIN (NITROSTAT) 0.4 MG SL tablet Place 0.4 mg under the tongue every 5 (five) minutes as needed for chest pain (X3 DOSES BEFORE CALLING 911).    . Omega-3 Fatty Acids (FISH OIL) 1200 MG CAPS Take 3 capsules by mouth daily.    . pantoprazole (PROTONIX) 40 MG tablet Take 1 tablet (40 mg total) by mouth daily. 30 tablet 3  . sildenafil (VIAGRA) 50 MG tablet Use as directed 8 tablet 1   No current facility-administered medications on file prior to visit.     Allergies  Allergen Reactions  . Peanut-Containing Drug Products Other (See Comments)    Pine nuts only.  . Codeine Palpitations    Increases heart rate, rapid heart rate  . Niacin Other (See Comments)    unknown  .  Penicillins Other (See Comments)    Does not know what reaction - happened as a child Was told not to take as a child.  . Rosuvastatin Other (See Comments)    Night sweats    Family History  Problem Relation Age of Onset  . Colon cancer Neg Hx   . Pancreatic cancer Neg Hx   . Stomach cancer Neg Hx   . Heart disease Father   . Hyperlipidemia Father   . Heart attack Maternal Grandfather 90  . Heart attack Paternal Grandfather 6  . Hypertension Neg Hx   . Stroke Neg Hx     Social History   Social History  . Marital status: Married    Spouse name: N/A  . Number of children: 2  . Years of education: N/A   Occupational History  . [employer]   Social History Main Topics  . Smoking  status: Former Smoker    Types: Cigarettes    Quit date: 03/18/1998  . Smokeless tobacco: Never Used     Comment: smoked <1ppd for 15 yrs & quit ~2000...  Marland Kitchen Alcohol use No  . Drug use: No  . Sexual activity: Yes   Other Topics Concern  . None   Social History Narrative  . None    Review of Systems - See HPI.  All other ROS are negative.  BP 118/76 (BP Location: Left Arm, Patient Position: Sitting, Cuff Size: Large)   Pulse 82   Temp 98.6 F (37 C) (Oral)   Resp 16   Ht _0  (1.778 m)   Wt 166 lb 2 oz (75.4 kg)   SpO2 98%   BMI 23.84 kg/m   Physical Exam  Constitutional: He is well-developed, well-nourished, and in no distress.  HENT:  Head: Normocephalic and atraumatic.  Eyes: Conjunctivae are normal. Pupils are equal, round, and reactive to light.  Neck: Neck supple.  Cardiovascular: Normal rate, regular rhythm, normal heart sounds and intact distal pulses.   Pulmonary/Chest: Effort normal and breath sounds normal. No respiratory distress. He has no wheezes. He has no rales. He exhibits no tenderness.  Skin: Skin is warm and dry. No rash noted.  Psychiatric: Affect normal.  Vitals reviewed.   Recent Results (from the past 2160 hour(s))  Comp Met (CMET)     Status: Abnormal   Collection Time: 12/06/15 12:07 PM  Result Value Ref Range   Sodium 140 135 - 146 mmol/L   Potassium 4.7 3.5 - 5.3 mmol/L   Chloride 106 98 - 110 mmol/L   CO2 25 20 - 31 mmol/L   Glucose, Bld 79 65 - 99 mg/dL   BUN 13 7 - 25 mg/dL   Creat 1.47 (H) 0.60 - 1.35 mg/dL   Total Bilirubin 0.6 0.2 - 1.2 mg/dL   Alkaline Phosphatase 24 (L) 40 - 115 U/L   AST 60 (H) 10 - 40 U/L   ALT 60 (H) 9 - 46 U/L   Total Protein 7.3 6.1 - 8.1 g/dL   Albumin 4.7 3.6 - 5.1 g/dL   Calcium 9.5 8.6 - 10.3 mg/dL  Lipid Profile     Status: Abnormal   Collection Time: 12/06/15 12:07 PM  Result Value Ref Range   Cholesterol 129 125 - 200 mg/dL   Triglycerides 162 (H) <150 mg/dL   HDL 29 (L) >=40 mg/dL   Total  CHOL/HDL Ratio 4.4 <=5.0 Ratio   VLDL 32 (H) <30 mg/dL   LDL Cholesterol 68 <130 mg/dL  Comment:   Total Cholesterol/HDL Ratio:CHD Risk                        Coronary Heart Disease Risk Table                                        Men       Women          1/2 Average Risk              3.4        3.3              Average Risk              5.0        4.4           2X Average Risk              9.6        7.1           3X Average Risk             23.4       11.0 Use the calculated Patient Ratio above and the CHD Risk table  to determine the patient's CHD Risk.     Assessment/Plan: Essential hypertension BP well controlled. Asymptomatic. Patient had labs drawn with Cardiology other today. Pending results at present. Continue current regimen.  Pre-diabetes Is compliant with medication regimen. Has worked on diet and exercise regimen. Will repeat A1C today. Will alter regimen accordingly. Hopefully things will have improved.    Leeanne Rio, PA-C

## 2015-12-06 NOTE — Patient Instructions (Signed)
Please continue medications as directed. Dr. Hassell Done office should be contacting you with results of the labs they drew on you.  I will call you with the results from labs this evening.  Stay active and eat a well-balanced diet.  Follow-up will be determined based on results.

## 2015-12-07 ENCOUNTER — Other Ambulatory Visit: Payer: Self-pay | Admitting: *Deleted

## 2015-12-07 DIAGNOSIS — E785 Hyperlipidemia, unspecified: Secondary | ICD-10-CM

## 2015-12-07 LAB — HEMOGLOBIN A1C: Hgb A1c MFr Bld: 6.4 % (ref 4.6–6.5)

## 2015-12-07 NOTE — Assessment & Plan Note (Signed)
BP well controlled. Asymptomatic. Patient had labs drawn with Cardiology other today. Pending results at present. Continue current regimen.

## 2015-12-07 NOTE — Assessment & Plan Note (Signed)
Is compliant with medication regimen. Has worked on diet and exercise regimen. Will repeat A1C today. Will alter regimen accordingly. Hopefully things will have improved.

## 2015-12-13 ENCOUNTER — Other Ambulatory Visit: Payer: Self-pay | Admitting: Interventional Cardiology

## 2015-12-20 ENCOUNTER — Other Ambulatory Visit: Payer: Self-pay | Admitting: Physician Assistant

## 2015-12-20 NOTE — Telephone Encounter (Signed)
Rx request to pharmacy/SLS  

## 2015-12-24 ENCOUNTER — Other Ambulatory Visit: Payer: Self-pay | Admitting: Physician Assistant

## 2015-12-25 NOTE — Telephone Encounter (Signed)
Rx request to pharmacy/SLS  

## 2016-01-23 ENCOUNTER — Encounter (INDEPENDENT_AMBULATORY_CARE_PROVIDER_SITE_OTHER): Payer: Self-pay

## 2016-01-23 ENCOUNTER — Encounter: Payer: Self-pay | Admitting: Interventional Cardiology

## 2016-01-23 ENCOUNTER — Ambulatory Visit (INDEPENDENT_AMBULATORY_CARE_PROVIDER_SITE_OTHER): Payer: Managed Care, Other (non HMO) | Admitting: Interventional Cardiology

## 2016-01-23 VITALS — BP 128/78 | HR 76 | Ht 70.0 in | Wt 166.6 lb

## 2016-01-23 DIAGNOSIS — I252 Old myocardial infarction: Secondary | ICD-10-CM | POA: Diagnosis not present

## 2016-01-23 DIAGNOSIS — I2510 Atherosclerotic heart disease of native coronary artery without angina pectoris: Secondary | ICD-10-CM

## 2016-01-23 DIAGNOSIS — I10 Essential (primary) hypertension: Secondary | ICD-10-CM | POA: Diagnosis not present

## 2016-01-23 DIAGNOSIS — E782 Mixed hyperlipidemia: Secondary | ICD-10-CM | POA: Diagnosis not present

## 2016-01-23 NOTE — Patient Instructions (Signed)
Medication Instructions:  Same-no changes  Labwork: Lipids and CMET just before your next follow up with Dr Irish Lack in 1 year. Please do not eat or drink after midnight the night before labs are drawn.  Testing/Procedures: None  Follow-Up: Your physician wants you to follow-up in: 1 year. You will receive a reminder letter in the mail two months in advance. If you don't receive a letter, please call our office to schedule the follow-up appointment.     If you need a refill on your cardiac medications before your next appointment, please call your pharmacy.

## 2016-01-23 NOTE — Progress Notes (Signed)
Cardiology Office Note   Date:  01/23/2016   ID:  CARR MATTINSON, DOB 24-Mar-1965, MRN [id number]  PCP:  Leeanne Rio, PA-C    No chief complaint on file. CAD   Wt Readings from Last 3 Encounters:  01/23/16 75.6 kg (166 lb 9.6 oz)  12/06/15 75.4 kg (166 lb 2 oz)  08/17/15 75.8 kg (167 lb)       History of Present Illness: Carlos Montgomery is a 50 y.o. male  Who had inferior MI in 9/16. He thinks the DOE was building prior to this. He received 2 stents to the RCA. He did well with the procedure.  He has been exercising. He does cardio type exercises.He has been maintaining the weight loss he had after his MI.  He denies any bleeding problems. He has not had similar symptoms like his prior angina. Overall, he is doing quite well. He is fully active at work.   Gets help with heavy lifting.  No bleeding problems.  Walks regularly.  No NTG use.  No leg swelling.      Past Medical History:  Diagnosis Date  . CAD (coronary artery disease)    cath 11/29/2014 bifurcation Culotte stent to distal RCA and ost PDA treated with 2.75x28 mm Synergy stent, and ost PLA with 2.5x29mm DES, 90% small OM2  . Diabetes mellitus    metformin  . Hyperlipemia   . Hypertension     Past Surgical History:  Procedure Laterality Date  . broken wrists    . CARDIAC CATHETERIZATION N/A 11/29/2014   Procedure: Left Heart Cath and Coronary Angiography;  Surgeon: Jettie Booze, MD;  Location: [facility];  Service: Cardiovascular;  Laterality: N/A;  . CARDIAC CATHETERIZATION N/A 11/29/2014   Procedure: Coronary Stent Intervention;  Surgeon: Jettie Booze, MD;  Location: [facility];  Service: Cardiovascular;  Laterality: N/A;  . RETINAL DETACHMENT SURGERY    . RHINOPLASTY    . TONSILLECTOMY AND ADENOIDECTOMY       Current Outpatient Prescriptions  Medication Sig Dispense Refill  . ALPRAZolam (XANAX) 0.5 MG tablet TAKE 1/2 TO 1 TABLET BY MOUTH 3 TIMES DAILY AS  NEEDED FOR ANXIETY 90 tablet 1  . amLODipine (NORVASC) 2.5 MG tablet TAKE 1 TABLET (2.5 MG TOTAL) BY MOUTH DAILY. 90 tablet 2  . aspirin 81 MG tablet Take 81 mg by mouth daily.     Marland Kitchen atorvastatin (LIPITOR) 80 MG tablet TAKE 1 TABLET (80 MG TOTAL) BY MOUTH DAILY AT 6 PM. 90 tablet 2  . Cinnamon 500 MG TABS Take 3 tablets by mouth daily.      . clopidogrel (PLAVIX) 75 MG tablet Take 1 tablet (75 mg total) by mouth daily. 30 tablet 8  . ezetimibe (ZETIA) 10 MG tablet TAKE 1 TABLET (10 MG TOTAL) BY MOUTH DAILY. 30 tablet 8  . fenofibrate micronized (ANTARA) 130 MG capsule TAKE 1 CAPSULE (130 MG TOTAL) BY MOUTH DAILY BEFORE BREAKFAST. 30 capsule 5  . losartan (COZAAR) 100 MG tablet TAKE 1 TABLET BY MOUTH EVERY DAY 30 tablet 2  . magnesium oxide (MAG-OX) 400 MG tablet Take 400 mg by mouth daily.    . metFORMIN (GLUCOPHAGE) 500 MG tablet TAKE 1 TABLET BY MOUTH TWICE A DAY WITH A MEAL (CALL FOR APPT) 60 tablet 5  . metoprolol succinate (TOPROL-XL) 100 MG 24 hr tablet TAKE 1 TABLET BY MOUTH DAILY IMMEDIATELY FOLLOWING A MEAL 30 tablet 3  . Multiple Vitamin (THERA) TABS  Take 1 tablet by mouth daily.    . nitroGLYCERIN (NITROSTAT) 0.4 MG SL tablet Place 0.4 mg under the tongue every 5 (five) minutes as needed for chest pain (X3 DOSES BEFORE CALLING 911).    . Omega-3 Fatty Acids (FISH OIL) 1200 MG CAPS Take 3 capsules by mouth daily.    . pantoprazole (PROTONIX) 40 MG tablet Take 1 tablet (40 mg total) by mouth daily. 30 tablet 3  . sildenafil (VIAGRA) 50 MG tablet Take 50 mg by mouth as directed.     No current facility-administered medications for this visit.     Allergies:   Peanut-containing drug products; Codeine; Niacin; Penicillins; and Rosuvastatin    Social History:  The patient  reports that he quit smoking about 17 years ago. His smoking use included Cigarettes. He has never used smokeless tobacco. He reports that he does not drink alcohol or use drugs.   Family History:  The patient's  family history includes Heart attack (age of onset: 61) in his paternal grandfather; Heart attack (age of onset: 74) in his maternal grandfather; Heart disease in his father; Hyperlipidemia in his father.    ROS:  Please see the history of present illness.   Otherwise, review of systems are positive for weight stable; BP controlled.   All other systems are reviewed and negative.    PHYSICAL EXAM: VS:  BP 128/78   Pulse 76   Ht 5\' 10"  (1.778 m)   Wt 75.6 kg (166 lb 9.6 oz)   BMI 23.90 kg/m  , BMI Body mass index is 23.9 kg/m. GEN: Well nourished, well developed, in no acute distress  HEENT: normal  Neck: no JVD, carotid bruits, or masses Cardiac: RRR; no murmurs, rubs, or gallops,no edema  Respiratory:  clear to auscultation bilaterally, normal work of breathing GI: soft, nontender, nondistended, + BS MS: no deformity or atrophy  Skin: warm and dry, no rash Neuro:  Strength and sensation are intact Psych: euthymic mood, full affect   EKG:   The ekg ordered today demonstrates *normal ECG   Recent Labs: 06/05/2015: Hemoglobin 12.2; Platelets 193.0; TSH 1.43 12/06/2015: ALT 60; BUN 13; Creat 1.47; Potassium 4.7; Sodium 140   Lipid Panel    Component Value Date/Time   CHOL 129 12/06/2015 1207   TRIG 162 (H) 12/06/2015 1207   HDL 29 (L) 12/06/2015 1207   CHOLHDL 4.4 12/06/2015 1207   VLDL 32 (H) 12/06/2015 1207   LDLCALC 68 12/06/2015 1207   LDLDIRECT 79.0 06/05/2015 1023     Other studies Reviewed: Additional studies/ records that were reviewed today with results demonstrating: cath results.   ASSESSMENT AND PLAN:  1. Old MI/CAD:  No angina. Continue aggressive secondary prevention. Continue dual antiplatelet therapy until next visit. COnsider stopping aspirin at that time. He will likely continue clopidogrel beyond that due to the bifurcation stenting he had during his acute MI. 2. Hyperlipidemia: Triglycerides are still  elevated. LDL controlled. I have asked him to  continue lifestyle modifications and try to improve his diet. We'll recheck his lipids in September 2018.  Continue atorvastatin and fish oil. 3. Hypertension: Blood pressure well controlled. Continue current medicines. No signs of hypotension. 4. Old MI: No CHF.   Current medicines are reviewed at length with the patient today.  The patient concerns regarding his medicines were addressed.  The following changes have been made:  No change   Labs/ tests ordered today include:  No orders of the defined types were placed in this  encounter.   Recommend 150 minutes/week of aerobic exercise Low fat, low carb, high fiber diet recommended  Disposition:   FU in 1 year   Signed, Larae Grooms, MD  01/23/2016 4:17 PM    [facility] [place], [place], [place]  [address] Phone: [phone]; Fax: [phone]

## 2016-01-28 ENCOUNTER — Other Ambulatory Visit: Payer: Self-pay | Admitting: Physician Assistant

## 2016-02-10 ENCOUNTER — Other Ambulatory Visit: Payer: Self-pay | Admitting: Physician Assistant

## 2016-02-12 ENCOUNTER — Telehealth: Payer: Self-pay | Admitting: Interventional Cardiology

## 2016-02-12 NOTE — Telephone Encounter (Signed)
Request for surgical clearance:  1. What type of surgery is being performed? Hair Transplant   2. When is this surgery scheduled? Pending    3. Are there any medications that need to be held prior to surgery and how long?Plavix to be held at minium 5 days prior    4. Name of physician performing surgery? Dr.Koher    5. What is your office phone and fax number?ph# [phone], [phone]

## 2016-02-12 NOTE — Telephone Encounter (Signed)
**Note De-Identified Carlos Montgomery Obfuscation** The pt also takes Aspirin 81 mg daily.  Please advise.

## 2016-02-12 NOTE — Telephone Encounter (Signed)
I have faxed this message to Dr Oscar La at [phone]. I did receive conformation that the fax was successful.

## 2016-02-12 NOTE — Telephone Encounter (Signed)
He will need to stay on aspirin.  OK to hold Plavix 5 days prior .  Restart Plavix post surgery.  No further cardiac testing needed before surgery.

## 2016-03-16 ENCOUNTER — Other Ambulatory Visit: Payer: Self-pay | Admitting: Physician Assistant

## 2016-03-24 ENCOUNTER — Other Ambulatory Visit: Payer: Self-pay | Admitting: Interventional Cardiology

## 2016-03-25 ENCOUNTER — Telehealth: Payer: Self-pay

## 2016-03-25 NOTE — Telephone Encounter (Signed)
Prior auth for Viagra 50mg  submitted to [facility].

## 2016-03-25 NOTE — Telephone Encounter (Signed)
**Note De-Identified Branton Einstein Obfuscation** Ok to fill. As Dr Irish Lack started the pt on this med on 01/19/15. Thanks.

## 2016-03-25 NOTE — Telephone Encounter (Signed)
OK to refill as long as he is not needing NTG.

## 2016-03-29 ENCOUNTER — Telehealth: Payer: Self-pay

## 2016-03-29 NOTE — Telephone Encounter (Signed)
Brand Viagra has been approved by [facility]. Valid through 03/29/2017. Local pharmacy notified.

## 2016-04-02 ENCOUNTER — Other Ambulatory Visit: Payer: Self-pay

## 2016-04-02 MED ORDER — NITROGLYCERIN 0.4 MG SL SUBL: 0.4000 mg | SUBLINGUAL_TABLET | SUBLINGUAL | 3 refills | Status: DC | PRN

## 2016-04-20 ENCOUNTER — Other Ambulatory Visit: Payer: Self-pay | Admitting: Physician Assistant

## 2016-04-21 NOTE — Telephone Encounter (Signed)
Have granted refill of Metoprolol -- 30 tablets with 1 refill. He is due for scheduled follow-up in March.  Will need to continue refills of medications.

## 2016-04-23 ENCOUNTER — Encounter: Payer: Self-pay | Admitting: Emergency Medicine

## 2016-04-23 NOTE — Telephone Encounter (Signed)
Letter mailed to notify patient to schedule follow up appointment

## 2016-05-08 ENCOUNTER — Other Ambulatory Visit: Payer: Self-pay | Admitting: Physician Assistant

## 2016-05-08 NOTE — Telephone Encounter (Signed)
Xanax last filled 11/07/15 #90 1 RF Last ov: 12/06/15 Patient is due for follow up in March. Please advise

## 2016-05-08 NOTE — Telephone Encounter (Signed)
[facility] Database reviewed. No red flags. Ok to give quantity 30 with 0 refills. He is due for a FU visit. Please contact patient to schedule.

## 2016-05-09 NOTE — Telephone Encounter (Signed)
LMOVM advising patient to call back to schedule 6 month follow up appointment. Xanax was verbally called into the pharmacy.

## 2016-05-09 NOTE — Telephone Encounter (Signed)
Xanax was verbally called into the [facility] pharmacy.

## 2016-05-24 ENCOUNTER — Ambulatory Visit: Payer: Managed Care, Other (non HMO) | Admitting: Physician Assistant

## 2016-05-28 ENCOUNTER — Encounter: Payer: Self-pay | Admitting: Physician Assistant

## 2016-05-28 ENCOUNTER — Ambulatory Visit (INDEPENDENT_AMBULATORY_CARE_PROVIDER_SITE_OTHER): Payer: 59 | Admitting: Physician Assistant

## 2016-05-28 VITALS — BP 120/80 | HR 78 | Temp 98.3°F | Resp 14 | Ht 70.0 in | Wt 168.0 lb

## 2016-05-28 DIAGNOSIS — R7303 Prediabetes: Secondary | ICD-10-CM | POA: Diagnosis not present

## 2016-05-28 DIAGNOSIS — E785 Hyperlipidemia, unspecified: Secondary | ICD-10-CM | POA: Diagnosis not present

## 2016-05-28 DIAGNOSIS — I10 Essential (primary) hypertension: Secondary | ICD-10-CM

## 2016-05-28 NOTE — Patient Instructions (Signed)
Please go to the lab for blood work. I will call you with your results.  We will alter medications according to results.  Please schedule an appointment for a complete physical examination as we are overdue for that.    Hypertension Hypertension, commonly called high blood pressure, is when the force of blood pumping through the arteries is too strong. The arteries are the blood vessels that carry blood from the heart throughout the body. Hypertension forces the heart to work harder to pump blood and may cause arteries to become narrow or stiff. Having untreated or uncontrolled hypertension can cause heart attacks, strokes, kidney disease, and other problems. A blood pressure reading consists of a higher number over a lower number. Ideally, your blood pressure should be below 120/80. The first ("top") number is called the systolic pressure. It is a measure of the pressure in your arteries as your heart beats. The second ("bottom") number is called the diastolic pressure. It is a measure of the pressure in your arteries as the heart relaxes. What are the causes? The cause of this condition is not known. What increases the risk? Some risk factors for high blood pressure are under your control. Others are not. Factors you can change   Smoking.  Having type 2 diabetes mellitus, high cholesterol, or both.  Not getting enough exercise or physical activity.  Being overweight.  Having too much fat, sugar, calories, or salt (sodium) in your diet.  Drinking too much alcohol. Factors that are difficult or impossible to change   Having chronic kidney disease.  Having a family history of high blood pressure.  Age. Risk increases with age.  Race. You may be at higher risk if you are African-American.  Gender. Men are at higher risk than women before age 71. After age 7, women are at higher risk than men.  Having obstructive sleep apnea.  Stress. What are the signs or  symptoms? Extremely high blood pressure (hypertensive crisis) may cause:  Headache.  Anxiety.  Shortness of breath.  Nosebleed.  Nausea and vomiting.  Severe chest pain.  Jerky movements you cannot control (seizures). How is this diagnosed? This condition is diagnosed by measuring your blood pressure while you are seated, with your arm resting on a surface. The cuff of the blood pressure monitor will be placed directly against the skin of your upper arm at the level of your heart. It should be measured at least twice using the same arm. Certain conditions can cause a difference in blood pressure between your right and left arms. Certain factors can cause blood pressure readings to be lower or higher than normal (elevated) for a short period of time:  When your blood pressure is higher when you are in a health care provider's office than when you are at home, this is called white coat hypertension. Most people with this condition do not need medicines.  When your blood pressure is higher at home than when you are in a health care provider's office, this is called masked hypertension. Most people with this condition may need medicines to control blood pressure. If you have a high blood pressure reading during one visit or you have normal blood pressure with other risk factors:  You may be asked to return on a different day to have your blood pressure checked again.  You may be asked to monitor your blood pressure at home for 1 week or longer. If you are diagnosed with hypertension, you may have other blood or  imaging tests to help your health care provider understand your overall risk for other conditions. How is this treated? This condition is treated by making healthy lifestyle changes, such as eating healthy foods, exercising more, and reducing your alcohol intake. Your health care provider may prescribe medicine if lifestyle changes are not enough to get your blood pressure under  control, and if:  Your systolic blood pressure is above 130.  Your diastolic blood pressure is above 80. Your personal target blood pressure may vary depending on your medical conditions, your age, and other factors. Follow these instructions at home: Eating and drinking   Eat a diet that is high in fiber and potassium, and low in sodium, added sugar, and fat. An example eating plan is called the DASH (Dietary Approaches to Stop Hypertension) diet. To eat this way:  Eat plenty of fresh fruits and vegetables. Try to fill half of your plate at each meal with fruits and vegetables.  Eat whole grains, such as whole wheat pasta, brown rice, or whole grain bread. Fill about one quarter of your plate with whole grains.  Eat or drink low-fat dairy products, such as skim milk or low-fat yogurt.  Avoid fatty cuts of meat, processed or cured meats, and poultry with skin. Fill about one quarter of your plate with lean proteins, such as fish, chicken without skin, beans, eggs, and tofu.  Avoid premade and processed foods. These tend to be higher in sodium, added sugar, and fat.  Reduce your daily sodium intake. Most people with hypertension should eat less than 1,500 mg of sodium a day.  Limit alcohol intake to no more than 1 drink a day for nonpregnant women and 2 drinks a day for men. One drink equals 12 oz of beer, 5 oz of wine, or 1 oz of hard liquor. Lifestyle   Work with your health care provider to maintain a healthy body weight or to lose weight. Ask what an ideal weight is for you.  Get at least 30 minutes of exercise that causes your heart to beat faster (aerobic exercise) most days of the week. Activities may include walking, swimming, or biking.  Include exercise to strengthen your muscles (resistance exercise), such as pilates or lifting weights, as part of your weekly exercise routine. Try to do these types of exercises for 30 minutes at least 3 days a week.  Do not use any products  that contain nicotine or tobacco, such as cigarettes and e-cigarettes. If you need help quitting, ask your health care provider.  Monitor your blood pressure at home as told by your health care provider.  Keep all follow-up visits as told by your health care provider. This is important. Medicines   Take over-the-counter and prescription medicines only as told by your health care provider. Follow directions carefully. Blood pressure medicines must be taken as prescribed.  Do not skip doses of blood pressure medicine. Doing this puts you at risk for problems and can make the medicine less effective.  Ask your health care provider about side effects or reactions to medicines that you should watch for. Contact a health care provider if:  You think you are having a reaction to a medicine you are taking.  You have headaches that keep coming back (recurring).  You feel dizzy.  You have swelling in your ankles.  You have trouble with your vision. Get help right away if:  You develop a severe headache or confusion.  You have unusual weakness or numbness.  You feel faint.  You have severe pain in your chest or abdomen.  You vomit repeatedly.  You have trouble breathing. Summary  Hypertension is when the force of blood pumping through your arteries is too strong. If this condition is not controlled, it may put you at risk for serious complications.  Your personal target blood pressure may vary depending on your medical conditions, your age, and other factors. For most people, a normal blood pressure is less than 120/80.  Hypertension is treated with lifestyle changes, medicines, or a combination of both. Lifestyle changes include weight loss, eating a healthy, low-sodium diet, exercising more, and limiting alcohol. This information is not intended to replace advice given to you by your health care provider. Make sure you discuss any questions you have with your health care  provider. Document Released: 03/04/2005 Document Revised: 01/31/2016 Document Reviewed: 01/31/2016 Elsevier Interactive Patient Education  2017 [employer].

## 2016-05-28 NOTE — Progress Notes (Signed)
Patient presents to clinic today for follow-up of hypertension and pre-diabetes. Patient is currently on a regimen of amlodipine 2.5 mg, losartan 100 mg daily, Toprol XL 100 mg daily . If followed by Cardiology and is on DAPT with Plavix and ASA. Walking 3 miles per week. No resistance training at present. Endorses well-balanced diet overall.  Is due for repeat labs today.   Past Medical History:  Diagnosis Date  . CAD (coronary artery disease)    cath 11/29/2014 bifurcation [place] stent to distal RCA and ost PDA treated with 2.75x28 mm Synergy stent, and ost PLA with 2.5x39mm DES, 90% small OM2  . Diabetes mellitus    metformin  . Hyperlipemia   . Hypertension     Current Outpatient Prescriptions on File Prior to Visit  Medication Sig Dispense Refill  . ALPRAZolam (XANAX) 0.5 MG tablet TAKE 1/2 TO 1 TABLET BY MOUTH 3 TIMES DAILY AS NEEDED FOR ANXIETY 30 tablet 0  . amLODipine (NORVASC) 2.5 MG tablet TAKE 1 TABLET (2.5 MG TOTAL) BY MOUTH DAILY. 90 tablet 2  . aspirin 81 MG tablet Take 81 mg by mouth daily.     Marland Kitchen atorvastatin (LIPITOR) 80 MG tablet TAKE 1 TABLET (80 MG TOTAL) BY MOUTH DAILY AT 6 PM. 90 tablet 2  . Cinnamon 500 MG TABS Take 3 tablets by mouth daily.      . clopidogrel (PLAVIX) 75 MG tablet Take 1 tablet (75 mg total) by mouth daily. 30 tablet 8  . ezetimibe (ZETIA) 10 MG tablet TAKE 1 TABLET (10 MG TOTAL) BY MOUTH DAILY. 30 tablet 8  . fenofibrate micronized (ANTARA) 130 MG capsule TAKE 1 CAPSULE (130 MG TOTAL) BY MOUTH DAILY BEFORE BREAKFAST. 30 capsule 5  . losartan (COZAAR) 100 MG tablet TAKE 1 TABLET BY MOUTH EVERY DAY 30 tablet 3  . magnesium oxide (MAG-OX) 400 MG tablet Take 400 mg by mouth daily.    . metFORMIN (GLUCOPHAGE) 500 MG tablet TAKE 1 TABLET BY MOUTH TWICE A DAY WITH A MEAL (CALL FOR APPT) 60 tablet 5  . metoprolol succinate (TOPROL-XL) 100 MG 24 hr tablet TAKE 1 TABLET BY MOUTH DAILY IMMEDIATELY FOLLOWING A MEAL 30 tablet 1  . Multiple Vitamin (THERA)  TABS Take 1 tablet by mouth daily.    . nitroGLYCERIN (NITROSTAT) 0.4 MG SL tablet Place 1 tablet (0.4 mg total) under the tongue every 5 (five) minutes as needed for chest pain (X3 DOSES BEFORE CALLING 911). 25 tablet 3  . Omega-3 Fatty Acids (FISH OIL) 1200 MG CAPS Take 3 capsules by mouth daily.    . pantoprazole (PROTONIX) 40 MG tablet Take 1 tablet (40 mg total) by mouth daily. 30 tablet 3  . sildenafil (VIAGRA) 50 MG tablet Take 1 tablet (50 mg total) by mouth as directed. Use as directed 10 tablet 1   No current facility-administered medications on file prior to visit.     Allergies  Allergen Reactions  . Peanut-Containing Drug Products Other (See Comments)    Pine nuts only.  . Codeine Palpitations    Increases heart rate, rapid heart rate  . Niacin Other (See Comments)    unknown  . Penicillins Other (See Comments)    Does not know what reaction - happened as a child Was told not to take as a child.  . Rosuvastatin Other (See Comments)    Night sweats    Family History  Problem Relation Age of Onset  . Colon cancer Neg Hx   . Pancreatic  cancer Neg Hx   . Stomach cancer Neg Hx   . Heart disease Father   . Hyperlipidemia Father   . Heart attack Maternal Grandfather 90  . Heart attack Paternal Grandfather 92  . Hypertension Neg Hx   . Stroke Neg Hx     Social History   Social History  . Marital status: Married    Spouse name: N/A  . Number of children: 2  . Years of education: N/A   Occupational History  . [employer]   Social History Main Topics  . Smoking status: Former Smoker    Types: Cigarettes    Quit date: 03/18/1998  . Smokeless tobacco: Never Used     Comment: smoked <1ppd for 15 yrs & quit ~2000...  Marland Kitchen Alcohol use No  . Drug use: No  . Sexual activity: Yes   Other Topics Concern  . None   Social History Narrative  . None   Review of Systems - See HPI.  All other ROS are negative.  BP 120/80   Pulse 78   Temp 98.3 F  (36.8 C) (Oral)   Resp 14   Ht 5\' 10"  (1.778 m)   Wt 168 lb (76.2 kg)   SpO2 96%   BMI 24.11 kg/m   Physical Exam  Constitutional: He is oriented to person, place, and time and well-developed, well-nourished, and in no distress. No distress.  Eyes: Conjunctivae are normal.  Neck: Neck supple.  Cardiovascular: Normal rate, regular rhythm and normal heart sounds.  Exam reveals no gallop and no friction rub.   No murmur heard. Pulmonary/Chest: Effort normal. No respiratory distress. He has no wheezes. He has no rales.  Lymphadenopathy:    He has no cervical adenopathy.  Neurological: He is alert and oriented to person, place, and time.  Skin: Skin is warm and dry. He is not diaphoretic.  Psychiatric: Affect normal.   Assessment/Plan: Essential hypertension BP well controlled. FU with Cardiology as scheduled. Lab today. Continue current regimen.  HLD (hyperlipidemia) Repeat labs today. Will alter regimen accordingly.  Pre-diabetes Diet and exercise reviewed. Repeat labs today.    Leeanne Rio, PA-C

## 2016-05-28 NOTE — Progress Notes (Signed)
Pre visit review using our clinic review tool, if applicable. No additional management support is needed unless otherwise documented below in the visit note. 

## 2016-05-29 LAB — COMPREHENSIVE METABOLIC PANEL
ALK PHOS: 22 U/L — AB (ref 39–117)
ALT: 38 U/L (ref 0–53)
AST: 53 U/L — AB (ref 0–37)
Albumin: 4.7 g/dL (ref 3.5–5.2)
BILIRUBIN TOTAL: 0.5 mg/dL (ref 0.2–1.2)
BUN: 12 mg/dL (ref 6–23)
CO2: 26 mEq/L (ref 19–32)
Calcium: 9.8 mg/dL (ref 8.4–10.5)
Chloride: 106 mEq/L (ref 96–112)
Creatinine, Ser: 1.24 mg/dL (ref 0.40–1.50)
GFR: 65.93 mL/min (ref >60.00)
GLUCOSE: 98 mg/dL (ref 70–99)
Potassium: 5 mEq/L (ref 3.5–5.1)
SODIUM: 139 meq/L (ref 135–145)
Total Protein: 7.2 g/dL (ref 6.0–8.3)

## 2016-05-29 LAB — LIPID PANEL
CHOL/HDL RATIO: 4
Cholesterol: 133 mg/dL (ref 0–200)
HDL: 30.9 mg/dL — ABNORMAL LOW (ref >39.00)
LDL Cholesterol: 64 mg/dL (ref 0–99)
NONHDL: 102.48
Triglycerides: 194 mg/dL — ABNORMAL HIGH (ref 0.0–149.0)
VLDL: 38.8 mg/dL (ref 0.0–40.0)

## 2016-05-29 LAB — HEMOGLOBIN A1C: Hgb A1c MFr Bld: 6.9 % — ABNORMAL HIGH (ref 4.6–6.5)

## 2016-05-30 NOTE — Assessment & Plan Note (Signed)
Repeat labs today. Will alter regimen accordingly.

## 2016-05-30 NOTE — Assessment & Plan Note (Signed)
BP well controlled. FU with Cardiology as scheduled. Lab today. Continue current regimen.

## 2016-05-30 NOTE — Assessment & Plan Note (Signed)
Diet and exercise reviewed. Repeat labs today.

## 2016-06-07 ENCOUNTER — Other Ambulatory Visit: Payer: Self-pay | Admitting: Physician Assistant

## 2016-06-07 NOTE — Telephone Encounter (Signed)
Last OV 05/28/16 Alprazolam last filled 05/09/16 #30 with 0

## 2016-06-10 ENCOUNTER — Other Ambulatory Visit: Payer: Managed Care, Other (non HMO)

## 2016-06-10 NOTE — Telephone Encounter (Signed)
Will refill 30 tablets. Needs to pick up -- UDS needed at pickup. Rx signed and placed at front desk.

## 2016-06-11 ENCOUNTER — Encounter: Payer: Self-pay | Admitting: Emergency Medicine

## 2016-06-11 NOTE — Telephone Encounter (Signed)
Advised patient rx is ready for pick up at front desk. He is to complete a CSC and give a UDS.

## 2016-06-17 ENCOUNTER — Other Ambulatory Visit: Payer: Self-pay | Admitting: Physician Assistant

## 2016-07-26 ENCOUNTER — Other Ambulatory Visit: Payer: Self-pay | Admitting: Physician Assistant

## 2016-07-26 ENCOUNTER — Other Ambulatory Visit: Payer: Self-pay | Admitting: Emergency Medicine

## 2016-08-03 ENCOUNTER — Other Ambulatory Visit: Payer: Self-pay | Admitting: Physician Assistant

## 2016-08-05 NOTE — Telephone Encounter (Signed)
Refill Request.  

## 2016-08-08 ENCOUNTER — Other Ambulatory Visit: Payer: Self-pay | Admitting: Family Medicine

## 2016-08-08 MED ORDER — METFORMIN HCL 500 MG PO TABS: ORAL_TABLET | ORAL | 3 refills | Status: DC

## 2016-08-16 ENCOUNTER — Other Ambulatory Visit: Payer: Self-pay | Admitting: Interventional Cardiology

## 2016-08-16 ENCOUNTER — Other Ambulatory Visit: Payer: Self-pay | Admitting: Physician Assistant

## 2016-08-16 NOTE — Telephone Encounter (Signed)
Xanax last filled 06/10/16 #30 CSC: 06/11/16 UDS: 06/25/16 moderate risk Last OV: 05/28/16  Please advise

## 2016-08-16 NOTE — Telephone Encounter (Signed)
Ok to fax in refill 30 tablets. 0 refills.

## 2016-08-19 NOTE — Telephone Encounter (Signed)
Verbally called the Alprazolam into the pharmacy per PCP.

## 2016-08-22 ENCOUNTER — Other Ambulatory Visit: Payer: Self-pay | Admitting: Physician Assistant

## 2016-08-22 NOTE — Telephone Encounter (Signed)
Please review for refill, Thanks !  

## 2016-08-22 NOTE — Telephone Encounter (Signed)
Please advise on refill request as most recent lipid panel in epic was by pcp and it is indicated that it was abnormal. Okay to refill or should this request be deferred to pcp? Thanks, MI

## 2016-08-31 ENCOUNTER — Other Ambulatory Visit: Payer: Self-pay | Admitting: Interventional Cardiology

## 2016-09-17 ENCOUNTER — Other Ambulatory Visit: Payer: Self-pay | Admitting: Physician Assistant

## 2016-09-25 ENCOUNTER — Ambulatory Visit: Payer: 59 | Admitting: Physician Assistant

## 2016-10-03 ENCOUNTER — Other Ambulatory Visit: Payer: Self-pay | Admitting: Physician Assistant

## 2016-10-03 NOTE — Telephone Encounter (Signed)
Last rx 08/19/16 #30 CSC: 06/11/16 UDS:06/25/16  Next appt: 10/16/16  Please advise

## 2016-10-04 NOTE — Telephone Encounter (Signed)
Rx faxed to the pharmacy.

## 2016-10-04 NOTE — Telephone Encounter (Signed)
Refill printed and signed. Ready for fax.

## 2016-10-16 ENCOUNTER — Encounter: Payer: Self-pay | Admitting: Physician Assistant

## 2016-10-16 ENCOUNTER — Ambulatory Visit (INDEPENDENT_AMBULATORY_CARE_PROVIDER_SITE_OTHER): Payer: 59 | Admitting: Physician Assistant

## 2016-10-16 VITALS — BP 116/86 | HR 82 | Temp 98.0°F | Resp 16 | Ht 70.0 in | Wt 165.4 lb

## 2016-10-16 DIAGNOSIS — E118 Type 2 diabetes mellitus with unspecified complications: Secondary | ICD-10-CM

## 2016-10-16 DIAGNOSIS — I2510 Atherosclerotic heart disease of native coronary artery without angina pectoris: Secondary | ICD-10-CM

## 2016-10-16 DIAGNOSIS — I10 Essential (primary) hypertension: Secondary | ICD-10-CM | POA: Diagnosis not present

## 2016-10-16 DIAGNOSIS — E782 Mixed hyperlipidemia: Secondary | ICD-10-CM

## 2016-10-16 LAB — COMPREHENSIVE METABOLIC PANEL
ALK PHOS: 25 U/L — AB (ref 39–117)
ALT: 41 U/L (ref 0–53)
AST: 40 U/L — AB (ref 0–37)
Albumin: 4.6 g/dL (ref 3.5–5.2)
BILIRUBIN TOTAL: 1 mg/dL (ref 0.2–1.2)
BUN: 12 mg/dL (ref 6–23)
CALCIUM: 9.6 mg/dL (ref 8.4–10.5)
CO2: 28 meq/L (ref 19–32)
CREATININE: 1.14 mg/dL (ref 0.40–1.50)
Chloride: 101 mEq/L (ref 96–112)
GFR: 72.54 mL/min (ref >60.00)
GLUCOSE: 77 mg/dL (ref 70–99)
Potassium: 4.6 mEq/L (ref 3.5–5.1)
Sodium: 136 mEq/L (ref 135–145)
TOTAL PROTEIN: 7 g/dL (ref 6.0–8.3)

## 2016-10-16 LAB — HEMOGLOBIN A1C: HEMOGLOBIN A1C: 6.4 % (ref 4.6–6.5)

## 2016-10-16 LAB — LIPID PANEL
CHOL/HDL RATIO: 3
CHOLESTEROL: 84 mg/dL (ref 0–200)
HDL: 31.7 mg/dL — AB (ref >39.00)
LDL Cholesterol: 20 mg/dL (ref 0–99)
NonHDL: 51.9
TRIGLYCERIDES: 161 mg/dL — AB (ref 0.0–149.0)
VLDL: 32.2 mg/dL (ref 0.0–40.0)

## 2016-10-16 NOTE — Assessment & Plan Note (Signed)
Followed by Cardiology. Is on Plavix. BP and cholesterol under control. Follow-up with Cardiology as scheduled.

## 2016-10-16 NOTE — Assessment & Plan Note (Signed)
Taking all medications as directed. Repeat labs today. Patient congratulated on his hard work on diet and exercise. Hopefully we will be able to wean down on medications in the future.

## 2016-10-16 NOTE — Patient Instructions (Signed)
Please go to the lab for blood work. I will call with results.  Please keep up the hard work with diet and exercise.  We will alter medications if indicated by lab results. Please follow-up with your specialists as scheduled.    Diabetes Mellitus and Exercise Exercising regularly is important for your overall health, especially when you have diabetes (diabetes mellitus). Exercising is not only about losing weight. It has many health benefits, such as increasing muscle strength and bone density and reducing body fat and stress. This leads to improved fitness, flexibility, and endurance, all of which result in better overall health. Exercise has additional benefits for people with diabetes, including:  Reducing appetite.  Helping to lower and control blood glucose.  Lowering blood pressure.  Helping to control amounts of fatty substances (lipids) in the blood, such as cholesterol and triglycerides.  Helping the body to respond better to insulin (improving insulin sensitivity).  Reducing how much insulin the body needs.  Decreasing the risk for heart disease by: ? Lowering cholesterol and triglyceride levels. ? Increasing the levels of good cholesterol. ? Lowering blood glucose levels.  What is my activity plan? Your health care provider or certified diabetes educator can help you make a plan for the type and frequency of exercise (activity plan) that works for you. Make sure that you:  Do at least 150 minutes of moderate-intensity or vigorous-intensity exercise each week. This could be brisk walking, biking, or water aerobics. ? Do stretching and strength exercises, such as yoga or weightlifting, at least 2 times a week. ? Spread out your activity over at least 3 days of the week.  Get some form of physical activity every day. ? Do not go more than 2 days in a row without some kind of physical activity. ? Avoid being inactive for more than 90 minutes at a time. Take frequent  breaks to walk or stretch.  Choose a type of exercise or activity that you enjoy, and set realistic goals.  Start slowly, and gradually increase the intensity of your exercise over time.  What do I need to know about managing my diabetes?  Check your blood glucose before and after exercising. ? If your blood glucose is higher than 240 mg/dL (13.3 mmol/L) before you exercise, check your urine for ketones. If you have ketones in your urine, do not exercise until your blood glucose returns to normal.  Know the symptoms of low blood glucose (hypoglycemia) and how to treat it. Your risk for hypoglycemia increases during and after exercise. Common symptoms of hypoglycemia can include: ? Hunger. ? Anxiety. ? Sweating and feeling clammy. ? Confusion. ? Dizziness or feeling light-headed. ? Increased heart rate or palpitations. ? Blurry vision. ? Tingling or numbness around the mouth, lips, or tongue. ? Tremors or shakes. ? Irritability.  Keep a rapid-acting carbohydrate snack available before, during, and after exercise to help prevent or treat hypoglycemia.  Avoid injecting insulin into areas of the body that are going to be exercised. For example, avoid injecting insulin into: ? The arms, when playing tennis. ? The legs, when jogging.  Keep records of your exercise habits. Doing this can help you and your health care provider adjust your diabetes management plan as needed. Write down: ? Food that you eat before and after you exercise. ? Blood glucose levels before and after you exercise. ? The type and amount of exercise you have done. ? When your insulin is expected to peak, if you use insulin. Avoid  exercising at times when your insulin is peaking.  When you start a new exercise or activity, work with your health care provider to make sure the activity is safe for you, and to adjust your insulin, medicines, or food intake as needed.  Drink plenty of water while you exercise to prevent  dehydration or heat stroke. Drink enough fluid to keep your urine clear or pale yellow. This information is not intended to replace advice given to you by your health care provider. Make sure you discuss any questions you have with your health care provider. Document Released: 05/25/2003 Document Revised: 09/22/2015 Document Reviewed: 08/14/2015 Elsevier Interactive Patient Education  2018 [employer].

## 2016-10-16 NOTE — Assessment & Plan Note (Signed)
BP stable. Asymptomatic. Continue current regimen. Follow-up with Cardiology as scheduled. Labs today.

## 2016-10-16 NOTE — Progress Notes (Signed)
Patient presents to clinic today for follow-up of chronic medical issues.   Hyperlipidemia -- Is working very hard on diet and exercise. Walking 3 x week for at least one mile. Is watchng portion sizes. Is currently on a regimen of Atorvastatin 80-mg, Fenofibrate and Zetia 10 mg daily. Followed by Cardiology for history of CAD. Is currently on Plavix daily.   Hypertension -- Is taking amlodipine, losartan and Toprol XL. Is taking medications as directed. Patient denies chest pain, palpitations, lightheadedness, dizziness, vision changes or frequent headaches.  BP Readings from Last 3 Encounters:  10/16/16 132/90  05/28/16 120/80  01/23/16 128/78   Diabetes -- new diagnosis. Last A1C at 6.9. Is taking Metformin 500 mg BID. Endorses last eye examination in December. Normal per patient. Foot examination is due today -- denies concerns today.   Past Medical History:  Diagnosis Date  . CAD (coronary artery disease)    cath 11/29/2014 bifurcation Culotte stent to distal RCA and ost PDA treated with 2.75x28 mm Synergy stent, and ost PLA with 2.5x34mm DES, 90% small OM2  . Diabetes mellitus    metformin  . Hyperlipemia   . Hypertension     Current Outpatient Prescriptions on File Prior to Visit  Medication Sig Dispense Refill  . ALPRAZolam (XANAX) 0.5 MG tablet TAKE 1/2 TO 1 TABLET BY MOUTH TWICE DAILY AS NEEDED FOR ANXIETY 30 tablet 1  . amLODipine (NORVASC) 2.5 MG tablet TAKE 1 TABLET (2.5 MG TOTAL) BY MOUTH DAILY. 90 tablet 1  . aspirin 81 MG tablet Take 81 mg by mouth daily.     Marland Kitchen atorvastatin (LIPITOR) 80 MG tablet TAKE 1 TABLET (80 MG TOTAL) BY MOUTH DAILY AT 6 PM. 90 tablet 2  . Cinnamon 500 MG TABS Take 3 tablets by mouth daily.      . clopidogrel (PLAVIX) 75 MG tablet TAKE 1 TABLET BY MOUTH DAILY 30 tablet 4  . ezetimibe (ZETIA) 10 MG tablet TAKE 1 TABLET (10 MG TOTAL) BY MOUTH DAILY. 30 tablet 4  . fenofibrate micronized (ANTARA) 130 MG capsule TAKE ONE CAPSULE BY MOUTH EVERY  MORNING BEFORE BREAKFAST 30 capsule 1  . losartan (COZAAR) 100 MG tablet TAKE 1 TABLET BY MOUTH EVERY DAY 90 tablet 0  . magnesium oxide (MAG-OX) 400 MG tablet Take 400 mg by mouth daily.    . metFORMIN (GLUCOPHAGE) 500 MG tablet TAKE 1 TABLET BY MOUTH TWICE A DAY WITH A MEAL (CALL FOR APPT) 60 tablet 3  . metoprolol succinate (TOPROL-XL) 100 MG 24 hr tablet TAKE 1 TABLET BY MOUTH DAILY IMMEDIATELY FOLLOWING A MEAL 30 tablet 3  . Multiple Vitamin (THERA) TABS Take 1 tablet by mouth daily.    . nitroGLYCERIN (NITROSTAT) 0.4 MG SL tablet Place 1 tablet (0.4 mg total) under the tongue every 5 (five) minutes as needed for chest pain (X3 DOSES BEFORE CALLING 911). 25 tablet 3  . Omega-3 Fatty Acids (FISH OIL) 1200 MG CAPS Take 3 capsules by mouth daily.    . pantoprazole (PROTONIX) 40 MG tablet Take 1 tablet (40 mg total) by mouth daily. 30 tablet 3  . sildenafil (VIAGRA) 50 MG tablet Take 1 tablet (50 mg total) by mouth as directed. Use as directed 10 tablet 1   No current facility-administered medications on file prior to visit.     Allergies  Allergen Reactions  . Peanut-Containing Drug Products Other (See Comments)    Pine nuts only.  . Codeine Palpitations    Increases heart rate, rapid heart  rate  . Niacin Other (See Comments)    unknown  . Penicillins Other (See Comments)    Does not know what reaction - happened as a child Was told not to take as a child.  . Rosuvastatin Other (See Comments)    Night sweats    Family History  Problem Relation Age of Onset  . Colon cancer Neg Hx   . Pancreatic cancer Neg Hx   . Stomach cancer Neg Hx   . Heart disease Father   . Hyperlipidemia Father   . Heart attack Maternal Grandfather 90  . Heart attack Paternal Grandfather 56  . Hypertension Neg Hx   . Stroke Neg Hx     Social History   Social History  . Marital status: Married    Spouse name: N/A  . Number of children: 2  . Years of education: N/A   Occupational History  .  [employer]   Social History Main Topics  . Smoking status: Former Smoker    Types: Cigarettes    Quit date: 03/18/1998  . Smokeless tobacco: Never Used     Comment: smoked <1ppd for 15 yrs & quit ~2000...  Marland Kitchen Alcohol use No  . Drug use: No  . Sexual activity: Yes   Other Topics Concern  . None   Social History Narrative  . None   Review of Systems - See HPI.  All other ROS are negative.  BP 132/90 (BP Location: Right Arm, Cuff Size: Normal)   Pulse 82   Temp 98 F (36.7 C) (Oral)   Resp 16   Ht 5\' 10"  (1.778 m)   Wt 165 lb 6.4 oz (75 kg)   SpO2 99%   BMI 23.73 kg/m   Physical Exam  Constitutional: He is oriented to person, place, and time and well-developed, well-nourished, and in no distress.  HENT:  Head: Normocephalic and atraumatic.  Eyes: Conjunctivae are normal.  Neck: Neck supple.  Cardiovascular: Normal rate, regular rhythm, normal heart sounds and intact distal pulses.   Pulmonary/Chest: Effort normal. No respiratory distress. He has no wheezes. He has no rales. He exhibits no tenderness.  Neurological: He is alert and oriented to person, place, and time.  Skin: Skin is warm and dry. No rash noted.  Psychiatric: Affect normal.  Vitals reviewed.  Diabetic Foot Exam - Simple   Simple Foot Form Diabetic Foot exam was performed with the following findings:  Yes 10/16/2016  9:50 AM  Visual Inspection No deformities, no ulcerations, no other skin breakdown bilaterally:  Yes Sensation Testing Intact to touch and monofilament testing bilaterally:  Yes Pulse Check Posterior Tibialis and Dorsalis pulse intact bilaterally:  Yes Comments    Assessment/Plan: Essential hypertension BP stable. Asymptomatic. Continue current regimen. Follow-up with Cardiology as scheduled. Labs today.   HLD (hyperlipidemia) Taking all medications as directed. Repeat labs today. Patient congratulated on his hard work on diet and exercise. Hopefully we will be able  to wean down on medications in the future.   Diabetes ([place]) Foot exam updated. No abnormal findings.  Eye exam up-to-date. Patient denies pneumonia vaccination. Will repeat labs today and alter regimen accordingly.  BP and cholesterol under control.   CAD (coronary artery disease), native coronary artery Followed by Cardiology. Is on Plavix. BP and cholesterol under control. Follow-up with Cardiology as scheduled.     Leeanne Rio, PA-C

## 2016-10-16 NOTE — Assessment & Plan Note (Signed)
Foot exam updated. No abnormal findings.  Eye exam up-to-date. Patient denies pneumonia vaccination. Will repeat labs today and alter regimen accordingly.  BP and cholesterol under control.

## 2016-10-23 ENCOUNTER — Other Ambulatory Visit: Payer: Self-pay | Admitting: Physician Assistant

## 2016-11-15 ENCOUNTER — Other Ambulatory Visit: Payer: Self-pay | Admitting: Physician Assistant

## 2016-11-20 ENCOUNTER — Other Ambulatory Visit: Payer: Self-pay | Admitting: Physician Assistant

## 2016-12-16 ENCOUNTER — Other Ambulatory Visit: Payer: Self-pay | Admitting: Physician Assistant

## 2016-12-16 NOTE — Telephone Encounter (Signed)
Rx approved and faxed to the local pharmacy [facility]

## 2016-12-16 NOTE — Telephone Encounter (Signed)
Last refill Alprazolam 10/04/16 #30 1RF Last CSC:06/11/16  Last UDS:10/16/16 moderate risk Last OV: 10/16/16 Please advise

## 2016-12-18 ENCOUNTER — Other Ambulatory Visit: Payer: Self-pay | Admitting: Physician Assistant

## 2016-12-25 ENCOUNTER — Telehealth: Payer: Self-pay | Admitting: Interventional Cardiology

## 2016-12-25 NOTE — Telephone Encounter (Signed)
New message     Pt c/o of Chest Pain: STAT if CP now or developed within 24 hours  1. Are you having CP right now?  yes  2. Are you experiencing any other symptoms (ex. SOB, nausea, vomiting, sweating)? Sob , heartburn  3. How long have you been experiencing CP this morning   4. Is your CP continuous or coming and going?  constant  5. Have you taken Nitroglycerin? no ?

## 2016-12-25 NOTE — Telephone Encounter (Signed)
Patient complaining of CP, SOB, and heartburn. Patient stated he feels like he did when he had his heart attack. Informed patient if this is what he was feeling when he had his MI that he should go to the ED. Patient wanted to know if he could come in today and have someone do a EKG on him. Informed patient that we do not have any open appt today.  Informed patient that if his CP is a MI that the ED is the best place for him to be. Patient verbalized understanding.

## 2017-01-07 ENCOUNTER — Encounter: Payer: Self-pay | Admitting: Interventional Cardiology

## 2017-01-15 ENCOUNTER — Other Ambulatory Visit: Payer: Self-pay | Admitting: Interventional Cardiology

## 2017-01-23 ENCOUNTER — Encounter: Payer: Self-pay | Admitting: Interventional Cardiology

## 2017-01-23 ENCOUNTER — Ambulatory Visit: Payer: 59 | Admitting: Interventional Cardiology

## 2017-01-23 VITALS — BP 130/68 | HR 71 | Ht 70.0 in | Wt 168.4 lb

## 2017-01-23 DIAGNOSIS — N529 Male erectile dysfunction, unspecified: Secondary | ICD-10-CM

## 2017-01-23 DIAGNOSIS — I10 Essential (primary) hypertension: Secondary | ICD-10-CM

## 2017-01-23 DIAGNOSIS — R400 Somnolence: Secondary | ICD-10-CM

## 2017-01-23 DIAGNOSIS — I2510 Atherosclerotic heart disease of native coronary artery without angina pectoris: Secondary | ICD-10-CM | POA: Diagnosis not present

## 2017-01-23 DIAGNOSIS — E782 Mixed hyperlipidemia: Secondary | ICD-10-CM

## 2017-01-23 MED ORDER — METOPROLOL SUCCINATE ER 50 MG PO TB24: 50.0000 mg | ORAL_TABLET | Freq: Every day | ORAL | 3 refills | Status: DC

## 2017-01-23 MED ORDER — NITROGLYCERIN 0.4 MG SL SUBL: 0.4000 mg | SUBLINGUAL_TABLET | SUBLINGUAL | 3 refills | Status: DC | PRN

## 2017-01-23 MED ORDER — ATORVASTATIN CALCIUM 40 MG PO TABS: 40.0000 mg | ORAL_TABLET | Freq: Every day | ORAL | 3 refills | Status: DC

## 2017-01-23 NOTE — Patient Instructions (Signed)
Medication Instructions:  Your physician has recommended you make the following change in your medication:   DECREASE: metoprolol succinate (Toprol-XL) to 50 mg daily  DECREASE: atorvastatin to 40 mg daily  Labwork: None ordered  Testing/Procedures: None ordered  Follow-Up: Your physician wants you to follow-up in: 1 year with [name]. You will receive a reminder letter in the mail two months in advance. If you don't receive a letter, please call our office to schedule the follow-up appointment.   Any Other Special Instructions Will Be Listed Below (If Applicable).     If you need a refill on your cardiac medications before your next appointment, please call your pharmacy.

## 2017-01-23 NOTE — Progress Notes (Signed)
Cardiology Office Note   Date:  01/23/2017   ID:  Carlos Montgomery, DOB Dec 07, 1965, MRN [id number]  PCP:  Brunetta Jeans, PA-C    No chief complaint on file.  CAD  Wt Readings from Last 3 Encounters:  01/23/17 168 lb 6.4 oz (76.4 kg)  10/16/16 165 lb 6.4 oz (75 kg)  05/28/16 168 lb (76.2 kg)       History of Present Illness: Carlos Montgomery is a 51 y.o. male  Who had inferior MI in 9/16. He thinks the DOE was building prior to this. He received 2 stents to the RCA. He did well with the procedure.  Denies : Chest pain. Dizziness. Leg edema. Nitroglycerin use. Orthopnea. Palpitations. Paroxysmal nocturnal dyspnea. Shortness of breath. Syncope.   He had a left hand injury due to a fracture.   He has been feeling drowsy with sedentary activity.    He walks 4-5 miles per week, and wants to start doing light weights.    Past Medical History:  Diagnosis Date  . CAD (coronary artery disease)    cath 11/29/2014 bifurcation Culotte stent to distal RCA and ost PDA treated with 2.75x28 mm Synergy stent, and ost PLA with 2.5x58mm DES, 90% small OM2  . Diabetes mellitus    metformin  . Hyperlipemia   . Hypertension     Past Surgical History:  Procedure Laterality Date  . broken wrists    . RETINAL DETACHMENT SURGERY    . RHINOPLASTY    . TONSILLECTOMY AND ADENOIDECTOMY       Current Outpatient Medications  Medication Sig Dispense Refill  . ALPRAZolam (XANAX) 0.5 MG tablet TAKE 1/2 TO 1 TABLET BY MOUTH TWICE A DAY AS NEEDED FOR ANXIETY 30 tablet 1  . amLODipine (NORVASC) 2.5 MG tablet TAKE 1 TABLET (2.5 MG TOTAL) BY MOUTH DAILY. 90 tablet 1  . aspirin 81 MG tablet Take 81 mg by mouth daily.     Marland Kitchen atorvastatin (LIPITOR) 80 MG tablet TAKE 1 TABLET (80 MG TOTAL) BY MOUTH DAILY AT 6 PM. 90 tablet 2  . Cinnamon 500 MG TABS Take 3 tablets by mouth daily.      . clopidogrel (PLAVIX) 75 MG tablet Take 1 tablet (75 mg total) by mouth daily. Please keep 11/8 at 4 pm  appointment for additional refills. 30 tablet 1  . ezetimibe (ZETIA) 10 MG tablet TAKE 1 TABLET (10 MG TOTAL) BY MOUTH DAILY. 30 tablet 4  . fenofibrate micronized (ANTARA) 130 MG capsule TAKE ONE CAPSULE BY MOUTH EVERY MORNING BEFORE BREAKFAST 90 capsule 1  . losartan (COZAAR) 100 MG tablet TAKE 1 TABLET BY MOUTH EVERY DAY 90 tablet 1  . magnesium oxide (MAG-OX) 400 MG tablet Take 400 mg by mouth daily.    . metFORMIN (GLUCOPHAGE) 500 MG tablet TAKE 1 TABLET BY MOUTH TWICE A DAY WITH A MEAL (CALL FOR APPT) 60 tablet 3  . metoprolol succinate (TOPROL-XL) 100 MG 24 hr tablet TAKE 1 TABLET BY MOUTH DAILY IMMEDIATELY FOLLOWING A MEAL 90 tablet 1  . Multiple Vitamin (THERA) TABS Take 1 tablet by mouth daily.    . nitroGLYCERIN (NITROSTAT) 0.4 MG SL tablet Place 1 tablet (0.4 mg total) under the tongue every 5 (five) minutes as needed for chest pain (X3 DOSES BEFORE CALLING 911). 25 tablet 3  . Omega-3 Fatty Acids (FISH OIL) 1200 MG CAPS Take 3 capsules by mouth daily.    . pantoprazole (PROTONIX) 40 MG tablet Take 1 tablet (40  mg total) by mouth daily. 30 tablet 3  . sildenafil (VIAGRA) 50 MG tablet Take 1 tablet (50 mg total) by mouth as directed. Use as directed 10 tablet 1   No current facility-administered medications for this visit.     Allergies:   Peanut-containing drug products; Codeine; Niacin; Penicillins; and Rosuvastatin    Social History:  The patient  reports that he quit smoking about 18 years ago. His smoking use included cigarettes. he has never used smokeless tobacco. He reports that he does not drink alcohol or use drugs.   Family History:  The patient's family history includes Heart attack (age of onset: 18) in his paternal grandfather; Heart attack (age of onset: 71) in his maternal grandfather; Heart disease in his father; Hyperlipidemia in his father.    ROS:  Please see the history of present illness.   Otherwise, review of systems are positive for erectile dysfunction.    All other systems are reviewed and negative.    PHYSICAL EXAM: VS:  BP 130/68   Pulse 71   Ht 5\' 10"  (1.778 m)   Wt 168 lb 6.4 oz (76.4 kg)   SpO2 95%   BMI 24.16 kg/m  , BMI Body mass index is 24.16 kg/m. GEN: Well nourished, well developed, in no acute distress  HEENT: normal  Neck: no JVD, carotid bruits, or masses Cardiac: RRR; no murmurs, rubs, or gallops,no edema  Respiratory:  clear to auscultation bilaterally, normal work of breathing GI: soft, nontender, nondistended, + BS MS: no deformity or atrophy  Skin: warm and dry, no rash Neuro:  Strength and sensation are intact Psych: euthymic mood, full affect   EKG:   The ekg ordered today demonstrates NSR, no ST segment changes   Recent Labs: 10/16/2016: ALT 41; BUN 12; Creatinine, Ser 1.14; Potassium 4.6; Sodium 136   Lipid Panel    Component Value Date/Time   CHOL 84 10/16/2016 1024   TRIG 161.0 (H) 10/16/2016 1024   HDL 31.70 (L) 10/16/2016 1024   CHOLHDL 3 10/16/2016 1024   VLDL 32.2 10/16/2016 1024   LDLCALC 20 10/16/2016 1024   LDLDIRECT 79.0 06/05/2015 1023     Other studies Reviewed: Additional studies/ records that were reviewed today with results demonstrating: lipids reviewed as noted above.   ASSESSMENT AND PLAN:  1. CAD/Old MI: No angina on medical therapy.  Continue clopidogrel given bifurcation stenting.  2. Hyperlipidemia:  Cholesterol is low.  LDL 20.  Decrease atorvastatin to 40 mg daily 3. Daytime sleepiness: Discussed OSA.  He will monitor and consider sleep study.  4. HTN: COntrolled. COntinue current meds.  5. Erectile dysfunction: Decrease metoprolol to 50 mg daily. Viagra has been ineffective perhis report.   Current medicines are reviewed at length with the patient today.  The patient concerns regarding his medicines were addressed.  The following changes have been made:  Decrease metoprolol.  Labs/ tests ordered today include:  No orders of the defined types were placed in this  encounter.   Recommend 150 minutes/week of aerobic exercise Low fat, low carb, high fiber diet recommended  Disposition:   FU in 1 year   Signed, Larae Grooms, MD  01/23/2017 [facility] [place], [place],   [address] Phone: [phone]; Fax: [phone]

## 2017-02-10 ENCOUNTER — Other Ambulatory Visit: Payer: Self-pay | Admitting: Physician Assistant

## 2017-02-10 ENCOUNTER — Other Ambulatory Visit: Payer: Self-pay | Admitting: Interventional Cardiology

## 2017-02-10 NOTE — Telephone Encounter (Signed)
Please review for refill, Thanks !  

## 2017-02-21 ENCOUNTER — Telehealth: Payer: Self-pay | Admitting: Emergency Medicine

## 2017-02-21 MED ORDER — ALPRAZOLAM 0.5 MG PO TABS: ORAL_TABLET | ORAL | 1 refills | Status: DC

## 2017-02-21 NOTE — Telephone Encounter (Signed)
Rx printed to be faxed

## 2017-02-21 NOTE — Telephone Encounter (Signed)
Refill request of Xanax Last refill 12/16/16 #30 1RF UDS: 10/16/16 moderate risk CSC: 06/11/16 Last ov: 01/23/17 follow up

## 2017-03-20 ENCOUNTER — Other Ambulatory Visit: Payer: Self-pay | Admitting: Interventional Cardiology

## 2017-04-17 ENCOUNTER — Other Ambulatory Visit: Payer: Self-pay | Admitting: Physician Assistant

## 2017-04-23 ENCOUNTER — Other Ambulatory Visit: Payer: Self-pay | Admitting: Physician Assistant

## 2017-04-23 ENCOUNTER — Encounter: Payer: Self-pay | Admitting: Emergency Medicine

## 2017-04-23 NOTE — Telephone Encounter (Signed)
Called patient but phone was disconnected, mailed a letter advising patient is due for a physical to call and schedule.

## 2017-04-23 NOTE — Telephone Encounter (Signed)
Last refill of Xanax 02/21/17 #30 1 RF CSC: 06/11/16 UDS: 10/16/16 Last OV: 10/16/16 HTN Overdue for CPE Please advise

## 2017-05-13 ENCOUNTER — Encounter: Payer: Self-pay | Admitting: Emergency Medicine

## 2017-05-13 ENCOUNTER — Other Ambulatory Visit: Payer: Self-pay | Admitting: Physician Assistant

## 2017-05-14 NOTE — Telephone Encounter (Signed)
Last refill Xanax 04/23/17 #15 CSC: 06/11/16 UDS: 10/16/16 Last OV: 10/16/16 No pending appt.  Please advise

## 2017-05-16 ENCOUNTER — Telehealth: Payer: Self-pay | Admitting: Physician Assistant

## 2017-05-16 ENCOUNTER — Other Ambulatory Visit: Payer: Self-pay | Admitting: Physician Assistant

## 2017-05-16 NOTE — Telephone Encounter (Signed)
Per Last Colonoscopy in 2014,was told to follow-up in 10 years.  This would mean patient is due for another one in 2024. Patient stated verbal understanding.  Patient also asked why his medication was denied last time.  Upon further research it was because he is overdue for CPE.  CPE has been scheduled for 05/28/17 at [facility]

## 2017-05-16 NOTE — Telephone Encounter (Signed)
Copied from [id number]. Topic: Quick Communication - See Telephone Encounter >> May 16, 2017  2:26 PM Hewitt Shorts wrote: CRM for notification. See Telephone encounter for: pt is wanting to know when is his next colonoscopy due  Best number [phone]  05/16/17.

## 2017-05-21 ENCOUNTER — Other Ambulatory Visit: Payer: Self-pay | Admitting: Interventional Cardiology

## 2017-05-28 ENCOUNTER — Encounter: Payer: Self-pay | Admitting: Physician Assistant

## 2017-05-28 ENCOUNTER — Other Ambulatory Visit: Payer: Self-pay

## 2017-05-28 ENCOUNTER — Ambulatory Visit (INDEPENDENT_AMBULATORY_CARE_PROVIDER_SITE_OTHER): Payer: BLUE CROSS/BLUE SHIELD | Admitting: Physician Assistant

## 2017-05-28 VITALS — BP 120/82 | HR 81 | Temp 98.2°F | Resp 16 | Ht 70.0 in | Wt 158.0 lb

## 2017-05-28 DIAGNOSIS — I2510 Atherosclerotic heart disease of native coronary artery without angina pectoris: Secondary | ICD-10-CM

## 2017-05-28 DIAGNOSIS — E118 Type 2 diabetes mellitus with unspecified complications: Secondary | ICD-10-CM

## 2017-05-28 DIAGNOSIS — Z0000 Encounter for general adult medical examination without abnormal findings: Secondary | ICD-10-CM | POA: Diagnosis not present

## 2017-05-28 DIAGNOSIS — I10 Essential (primary) hypertension: Secondary | ICD-10-CM

## 2017-05-28 DIAGNOSIS — Z125 Encounter for screening for malignant neoplasm of prostate: Secondary | ICD-10-CM

## 2017-05-28 DIAGNOSIS — E782 Mixed hyperlipidemia: Secondary | ICD-10-CM | POA: Diagnosis not present

## 2017-05-28 LAB — COMPREHENSIVE METABOLIC PANEL
ALBUMIN: 4.7 g/dL (ref 3.5–5.2)
ALK PHOS: 24 U/L — AB (ref 39–117)
ALT: 37 U/L (ref 0–53)
AST: 39 U/L — AB (ref 0–37)
BUN: 9 mg/dL (ref 6–23)
CO2: 29 mEq/L (ref 19–32)
CREATININE: 1.07 mg/dL (ref 0.40–1.50)
Calcium: 9.9 mg/dL (ref 8.4–10.5)
Chloride: 103 mEq/L (ref 96–112)
GFR: 77.84 mL/min (ref >60.00)
Glucose, Bld: 100 mg/dL — ABNORMAL HIGH (ref 70–99)
Potassium: 5.1 mEq/L (ref 3.5–5.1)
SODIUM: 139 meq/L (ref 135–145)
TOTAL PROTEIN: 7.2 g/dL (ref 6.0–8.3)
Total Bilirubin: 0.6 mg/dL (ref 0.2–1.2)

## 2017-05-28 LAB — CBC WITH DIFFERENTIAL/PLATELET
Basophils Absolute: 0.1 10*3/uL (ref 0.0–0.1)
Basophils Relative: 1.1 % (ref 0.0–3.0)
EOS ABS: 0.1 10*3/uL (ref 0.0–0.7)
EOS PCT: 1.5 % (ref 0.0–5.0)
HCT: 41 % (ref 39.0–52.0)
HEMOGLOBIN: 13.9 g/dL (ref 13.0–17.0)
Lymphocytes Relative: 35.3 % (ref 12.0–46.0)
Lymphs Abs: 1.6 10*3/uL (ref 0.7–4.0)
MCHC: 33.9 g/dL (ref 30.0–36.0)
MCV: 97 fl (ref 78.0–100.0)
MONO ABS: 0.4 10*3/uL (ref 0.1–1.0)
Monocytes Relative: 9.6 % (ref 3.0–12.0)
Neutro Abs: 2.4 10*3/uL (ref 1.4–7.7)
Neutrophils Relative %: 52.5 % (ref 43.0–77.0)
Platelets: 181 10*3/uL (ref 150.0–400.0)
RBC: 4.23 Mil/uL (ref 4.22–5.81)
RDW: 12.8 % (ref 11.5–15.5)
WBC: 4.5 10*3/uL (ref 4.0–10.5)

## 2017-05-28 LAB — LIPID PANEL
CHOLESTEROL: 89 mg/dL (ref 0–200)
HDL: 36.5 mg/dL — ABNORMAL LOW (ref >39.00)
LDL Cholesterol: 27 mg/dL (ref 0–99)
NonHDL: 52.06
Total CHOL/HDL Ratio: 2
Triglycerides: 124 mg/dL (ref 0.0–149.0)
VLDL: 24.8 mg/dL (ref 0.0–40.0)

## 2017-05-28 LAB — PSA: PSA: 0.65 ng/mL (ref 0.10–4.00)

## 2017-05-28 LAB — HEMOGLOBIN A1C: HEMOGLOBIN A1C: 6.2 % (ref 4.6–6.5)

## 2017-05-28 NOTE — Progress Notes (Signed)
Patient presents to clinic today for annual exam.  Patient is fasting for labs.  Diet -- Endorses well-balanced diet overall. Is watching portion size. Has lost 10 pounds.  Exercise -- Walking daily.   Acute Concerns: Denies acute concerns as directed.  Chronic Issues: Diabetes Mellitus -- Currently on a regimen of Metformin 500 mg BID. Is taking as directed. Tolerating well. Denies polyuria, polydipsia or polyphagia. Denies vision changes. Is due for eye exam. Has appointment scheduled. Declines flu and pneumonia vaccine.   Hypertension -- Currently on a regimen of losartan 100 mg daily and Toprol XL 50 mg daily. Patient denies chest pain, palpitations, lightheadedness, dizziness, vision changes or frequent headaches. Is taking Atorvastatin 40 mg and Plavix daily as directed.  BP Readings from Last 3 Encounters:  05/28/17 120/82  01/23/17 130/68  10/16/16 116/86   Health Maintenance: Immunizations -- Declines flu and pneumonia vaccine.   Past Medical History:  Diagnosis Date  . CAD (coronary artery disease)    cath 11/29/2014 bifurcation Culotte stent to distal RCA and ost PDA treated with 2.75x28 mm Synergy stent, and ost PLA with 2.5x64mm DES, 90% small OM2  . Diabetes mellitus    metformin  . Hyperlipemia   . Hypertension     Past Surgical History:  Procedure Laterality Date  . broken wrists    . CARDIAC CATHETERIZATION N/A 11/29/2014   Procedure: Left Heart Cath and Coronary Angiography;  Surgeon: Jettie Booze, MD;  Location: [facility];  Service: Cardiovascular;  Laterality: N/A;  . CARDIAC CATHETERIZATION N/A 11/29/2014   Procedure: Coronary Stent Intervention;  Surgeon: Jettie Booze, MD;  Location: [facility];  Service: Cardiovascular;  Laterality: N/A;  . RETINAL DETACHMENT SURGERY    . RHINOPLASTY    . TONSILLECTOMY AND ADENOIDECTOMY      Current Outpatient Medications on File Prior to Visit  Medication Sig Dispense Refill  .  ALPRAZolam (XANAX) 0.5 MG tablet TAKE 1/2 TO 1 TABLET BY MOUTH TWICE A DAY AS NEEDED FOR ANXIETY 15 tablet 0  . amLODipine (NORVASC) 2.5 MG tablet TAKE 1 TABLET (2.5 MG TOTAL) BY MOUTH DAILY. 90 tablet 3  . aspirin 81 MG tablet Take 81 mg by mouth daily.     Marland Kitchen atorvastatin (LIPITOR) 40 MG tablet Take 1 tablet (40 mg total) daily by mouth. 90 tablet 3  . Cinnamon 500 MG TABS Take 3 tablets by mouth daily.      . clopidogrel (PLAVIX) 75 MG tablet Take 1 tablet (75 mg total) by mouth daily. 90 tablet 3  . fenofibrate micronized (ANTARA) 130 MG capsule TAKE ONE CAPSULE BY MOUTH EVERY MORNING BEFORE BREAKFAST 90 capsule 1  . losartan (COZAAR) 100 MG tablet TAKE 1 TABLET BY MOUTH EVERY DAY 90 tablet 1  . magnesium oxide (MAG-OX) 400 MG tablet Take 400 mg by mouth daily.    . metFORMIN (GLUCOPHAGE) 500 MG tablet TAKE 1 TABLET BY MOUTH TWICE A DAY WITH A MEAL (CALL FOR APPT) 60 tablet 0  . metoprolol succinate (TOPROL-XL) 50 MG 24 hr tablet Take 1 tablet (50 mg total) daily by mouth. Take with or immediately following a meal. 90 tablet 3  . Multiple Vitamin (THERA) TABS Take 1 tablet by mouth daily.    . nitroGLYCERIN (NITROSTAT) 0.4 MG SL tablet Place 1 tablet (0.4 mg total) every 5 (five) minutes as needed under the tongue for chest pain (X3 DOSES BEFORE CALLING 911). 25 tablet 3  . Omega-3 Fatty Acids (  FISH OIL) 1200 MG CAPS Take 3 capsules by mouth daily.    . pantoprazole (PROTONIX) 40 MG tablet Take 1 tablet (40 mg total) by mouth daily. 30 tablet 3   No current facility-administered medications on file prior to visit.     Allergies  Allergen Reactions  . Peanut-Containing Drug Products Other (See Comments)    Pine nuts only.  . Codeine Palpitations    Increases heart rate, rapid heart rate  . Niacin Other (See Comments)    unknown  . Penicillins Other (See Comments)    Does not know what reaction - happened as a child Was told not to take as a child.  . Rosuvastatin Other (See Comments)      Night sweats    Family History  Problem Relation Age of Onset  . Heart disease Father   . Hyperlipidemia Father   . Heart attack Maternal Grandfather 90  . Heart attack Paternal Grandfather 39  . Colon cancer Neg Hx   . Pancreatic cancer Neg Hx   . Stomach cancer Neg Hx   . Hypertension Neg Hx   . Stroke Neg Hx     Social History   Socioeconomic History  . Marital status: Married    Spouse name: Not on file  . Number of children: 2  . Years of education: Not on file  . Highest education level: Not on file  Social Needs  . Financial resource strain: Not on file  . Food insecurity - worry: Not on file  . Food insecurity - inability: Not on file  . Transportation needs - medical: Not on file  . Transportation needs - non-medical: Not on file  Occupational History  . Occupation: Optometrist: [employer]  Tobacco Use  . Smoking status: Former Smoker    Types: Cigarettes    Last attempt to quit: 03/18/1998    Years since quitting: 19.2  . Smokeless tobacco: Never Used  . Tobacco comment: smoked <1ppd for 15 yrs & quit ~2000...  Substance and Sexual Activity  . Alcohol use: No    Alcohol/week: 0.0 oz  . Drug use: No  . Sexual activity: Yes  Other Topics Concern  . Not on file  Social History Narrative  . Not on file   Review of Systems  Constitutional: Negative for fever and weight loss.  HENT: Negative for ear discharge, ear pain, hearing loss and tinnitus.   Eyes: Negative for blurred vision, double vision, photophobia and pain.  Respiratory: Negative for cough and shortness of breath.   Cardiovascular: Negative for chest pain and palpitations.  Gastrointestinal: Negative for abdominal pain, blood in stool, constipation, diarrhea, heartburn, melena, nausea and vomiting.  Genitourinary: Negative for dysuria, flank pain, frequency, hematuria and urgency.  Musculoskeletal: Negative for falls.  Neurological: Negative for dizziness, loss of  consciousness and headaches.  Endo/Heme/Allergies: Negative for environmental allergies.  Psychiatric/Behavioral: Negative for depression, hallucinations, substance abuse and suicidal ideas. The patient is not nervous/anxious and does not have insomnia.     BP 120/82   Pulse 81   Temp 98.2 F (36.8 C) (Oral)   Resp 16   Ht 5\' 10"  (1.778 m)   Wt 158 lb (71.7 kg)   SpO2 95%   BMI 22.67 kg/m   Physical Exam  Constitutional: He is oriented to person, place, and time and well-developed, well-nourished, and in no distress.  HENT:  Head: Normocephalic and atraumatic.  Right Ear: External ear normal.  Left Ear:  External ear normal.  Nose: Nose normal.  Mouth/Throat: Oropharynx is clear and moist. No oropharyngeal exudate.  Eyes: Conjunctivae and EOM are normal. Pupils are equal, round, and reactive to light.  Neck: Neck supple. No thyromegaly present.  Cardiovascular: Normal rate, regular rhythm, normal heart sounds and intact distal pulses.  Pulmonary/Chest: Effort normal and breath sounds normal. No respiratory distress. He has no wheezes. He has no rales. He exhibits no tenderness.  Abdominal: Soft. Bowel sounds are normal. He exhibits no distension and no mass. There is no tenderness. There is no rebound and no guarding.  Genitourinary: Testes/scrotum normal and penis normal. No discharge found.  Lymphadenopathy:    He has no cervical adenopathy.  Neurological: He is alert and oriented to person, place, and time.  Skin: Skin is warm and dry. No rash noted.  Psychiatric: Affect normal.  Vitals reviewed.  Assessment/Plan: Essential hypertension BP stable. Continue current regimen. CMP today.  CAD (coronary artery disease), native coronary artery Asymptomatic. Taking statin and Plavix as directed. Has worked on diet and exercise with a 10-pound weight loss. Updating Diabetic labs today. Previously very well-controlled. Continue care as directed by specialist. Follow-up as  scheduled.   Diabetes ([place]) Foot exam up-to-date.  Patient due for eye exam -- has scheduled.  Taking medications as directed.  Is on ARB therapy.  Repeat CMP, Lipids, A1C today.   Prostate cancer screening The natural history of prostate cancer and ongoing controversy regarding screening and potential treatment outcomes of prostate cancer has been discussed with the patient. The meaning of a false positive PSA and a false negative PSA has been discussed. He indicates understanding of the limitations of this screening test and wishes to proceed with screening PSA testing.   HLD (hyperlipidemia) Tolerating statin. Repeat fasting lipids and CMP.  Visit for preventive health examination Depression screen negative. Health Maintenance reviewed. Preventive schedule discussed and handout given in AVS. Will obtain fasting labs today.     Leeanne Rio, PA-C

## 2017-05-28 NOTE — Assessment & Plan Note (Signed)
BP stable. Continue current regimen. CMP today.

## 2017-05-28 NOTE — Assessment & Plan Note (Signed)
Tolerating statin. Repeat fasting lipids and CMP.

## 2017-05-28 NOTE — Assessment & Plan Note (Signed)
Asymptomatic. Taking statin and Plavix as directed. Has worked on diet and exercise with a 10-pound weight loss. Updating Diabetic labs today. Previously very well-controlled. Continue care as directed by specialist. Follow-up as scheduled.

## 2017-05-28 NOTE — Patient Instructions (Signed)
Please go to the lab for blood work.   Our office will call you with your results unless you have chosen to receive results via MyChart.  If your blood work is normal we will follow-up each year for physicals and as scheduled for chronic medical problems.  If anything is abnormal we will treat accordingly and get you in for a follow-up.   Preventive Care 40-64 Years, Male Preventive care refers to lifestyle choices and visits with your health care provider that can promote health and wellness. What does preventive care include?  A yearly physical exam. This is also called an annual well check.  Dental exams once or twice a year.  Routine eye exams. Ask your health care provider how often you should have your eyes checked.  Personal lifestyle choices, including: ? Daily care of your teeth and gums. ? Regular physical activity. ? Eating a healthy diet. ? Avoiding tobacco and drug use. ? Limiting alcohol use. ? Practicing safe sex. ? Taking low-dose aspirin every day starting at age 57. What happens during an annual well check? The services and screenings done by your health care provider during your annual well check will depend on your age, overall health, lifestyle risk factors, and family history of disease. Counseling Your health care provider may ask you questions about your:  Alcohol use.  Tobacco use.  Drug use.  Emotional well-being.  Home and relationship well-being.  Sexual activity.  Eating habits.  Work and work Statistician.  Screening You may have the following tests or measurements:  Height, weight, and BMI.  Blood pressure.  Lipid and cholesterol levels. These may be checked every 5 years, or more frequently if you are over 68 years old.  Skin check.  Lung cancer screening. You may have this screening every year starting at age 49 if you have a 30-pack-year history of smoking and currently smoke or have quit within the past 15 years.  Fecal  occult blood test (FOBT) of the stool. You may have this test every year starting at age 65.  Flexible sigmoidoscopy or colonoscopy. You may have a sigmoidoscopy every 5 years or a colonoscopy every 10 years starting at age 56.  Prostate cancer screening. Recommendations will vary depending on your family history and other risks.  Hepatitis C blood test.  Hepatitis B blood test.  Sexually transmitted disease (STD) testing.  Diabetes screening. This is done by checking your blood sugar (glucose) after you have not eaten for a while (fasting). You may have this done every 1-3 years.  Discuss your test results, treatment options, and if necessary, the need for more tests with your health care provider. Vaccines Your health care provider may recommend certain vaccines, such as:  Influenza vaccine. This is recommended every year.  Tetanus, diphtheria, and acellular pertussis (Tdap, Td) vaccine. You may need a Td booster every 10 years.  Varicella vaccine. You may need this if you have not been vaccinated.  Zoster vaccine. You may need this after age 82.  Measles, mumps, and rubella (MMR) vaccine. You may need at least one dose of MMR if you were born in 1957 or later. You may also need a second dose.  Pneumococcal 13-valent conjugate (PCV13) vaccine. You may need this if you have certain conditions and have not been vaccinated.  Pneumococcal polysaccharide (PPSV23) vaccine. You may need one or two doses if you smoke cigarettes or if you have certain conditions.  Meningococcal vaccine. You may need this if you have certain  conditions.  Hepatitis A vaccine. You may need this if you have certain conditions or if you travel or work in places where you may be exposed to hepatitis A.  Hepatitis B vaccine. You may need this if you have certain conditions or if you travel or work in places where you may be exposed to hepatitis B.  Haemophilus influenzae type b (Hib) vaccine. You may need  this if you have certain risk factors.  Talk to your health care provider about which screenings and vaccines you need and how often you need them. This information is not intended to replace advice given to you by your health care provider. Make sure you discuss any questions you have with your health care provider. Document Released: 03/31/2015 Document Revised: 11/22/2015 Document Reviewed: 01/03/2015 Elsevier Interactive Patient Education  2018 Elsevier Inc. .      

## 2017-05-28 NOTE — Assessment & Plan Note (Signed)
The natural history of prostate cancer and ongoing controversy regarding screening and potential treatment outcomes of prostate cancer has been discussed with the patient. The meaning of a false positive PSA and a false negative PSA has been discussed. He indicates understanding of the limitations of this screening test and wishes  to proceed with screening PSA testing.  

## 2017-05-28 NOTE — Assessment & Plan Note (Signed)
Depression screen negative. Health Maintenance reviewed. Preventive schedule discussed and handout given in AVS. Will obtain fasting labs today.  

## 2017-05-28 NOTE — Assessment & Plan Note (Signed)
Foot exam up-to-date.  Patient due for eye exam -- has scheduled.  Taking medications as directed.  Is on ARB therapy.  Repeat CMP, Lipids, A1C today.

## 2017-06-02 ENCOUNTER — Other Ambulatory Visit: Payer: Self-pay | Admitting: *Deleted

## 2017-06-02 ENCOUNTER — Other Ambulatory Visit: Payer: Self-pay | Admitting: Physician Assistant

## 2017-06-02 MED ORDER — METFORMIN HCL 500 MG PO TABS: ORAL_TABLET | ORAL | 0 refills | Status: DC

## 2017-06-02 MED ORDER — ALPRAZOLAM 0.5 MG PO TABS: ORAL_TABLET | ORAL | 2 refills | Status: DC

## 2017-06-02 MED ORDER — PANTOPRAZOLE SODIUM 40 MG PO TBEC: 40.0000 mg | DELAYED_RELEASE_TABLET | Freq: Every day | ORAL | 3 refills | Status: DC

## 2017-06-02 MED ORDER — LOSARTAN POTASSIUM 100 MG PO TABS: 100.0000 mg | ORAL_TABLET | Freq: Every day | ORAL | 1 refills | Status: DC

## 2017-06-02 MED ORDER — FENOFIBRATE MICRONIZED 130 MG PO CAPS: ORAL_CAPSULE | ORAL | 1 refills | Status: DC

## 2017-06-02 NOTE — Telephone Encounter (Signed)
Rx Xanax sent to pharmacy.  I see that the other medications have been sent in electronically by CMA.

## 2017-06-02 NOTE — Telephone Encounter (Signed)
Alprazolam LOV: 05/28/17 PCP: [place]: [place], [place]

## 2017-06-02 NOTE — Telephone Encounter (Signed)
Copied from [facility] #[id number]. Topic: Quick Communication - Rx Refill/Question >> Jun 02, 2017 11:53 AM Corie Chiquito, NT wrote: Medication: Losartan, Metformin, Alprazolam, Fenofibrate-micronized, and Pantoprazole   Has the patient contacted their pharmacy? Yes  Patient is calling because he still hasn't received his refill on the above medications. Stated he has been in contact with his pharmacy about this as well  Preferred Pharmacy (with phone number or street name):[facility] [phone]   Agent: Please be advised that RX refills may take up to 3 business days. We ask that you follow-up with your pharmacy.

## 2017-07-28 ENCOUNTER — Other Ambulatory Visit: Payer: Self-pay | Admitting: Physician Assistant

## 2017-08-05 ENCOUNTER — Other Ambulatory Visit: Payer: Self-pay | Admitting: Physician Assistant

## 2017-08-05 ENCOUNTER — Other Ambulatory Visit: Payer: Self-pay

## 2017-08-05 MED ORDER — METOPROLOL SUCCINATE ER 50 MG PO TB24: 50.0000 mg | ORAL_TABLET | Freq: Every day | ORAL | 2 refills | Status: DC

## 2017-08-22 ENCOUNTER — Other Ambulatory Visit: Payer: Self-pay | Admitting: Physician Assistant

## 2017-09-03 ENCOUNTER — Ambulatory Visit: Payer: BLUE CROSS/BLUE SHIELD | Admitting: Physician Assistant

## 2017-09-08 ENCOUNTER — Encounter: Payer: Self-pay | Admitting: Physician Assistant

## 2017-09-08 ENCOUNTER — Ambulatory Visit: Payer: BLUE CROSS/BLUE SHIELD | Admitting: Physician Assistant

## 2017-09-08 ENCOUNTER — Other Ambulatory Visit: Payer: Self-pay

## 2017-09-08 VITALS — BP 108/72 | HR 85 | Temp 98.0°F | Resp 14 | Ht 70.0 in | Wt 159.0 lb

## 2017-09-08 DIAGNOSIS — E118 Type 2 diabetes mellitus with unspecified complications: Secondary | ICD-10-CM | POA: Diagnosis not present

## 2017-09-08 LAB — POCT GLYCOSYLATED HEMOGLOBIN (HGB A1C): HEMOGLOBIN A1C: 6 % — AB (ref 4.0–5.6)

## 2017-09-08 NOTE — Assessment & Plan Note (Signed)
POC A1C today at 6.0. Well-controlled. Continue current regimen. Patient is scheduling his eye exam as it is overdue. Will follow-up in 6 months. Marland Kitchen

## 2017-09-08 NOTE — Patient Instructions (Signed)
A1C looks great! Keep up the good work!  Continue current medication regimen. Make sure to check on the Lipitor (Atorvastatin). If not taking you need to restart.   Follow-up with me in 6 months.  Return sooner if needed.

## 2017-09-08 NOTE — Progress Notes (Signed)
History of Present Illness: Patient is a 52 y.o. male who presents to clinic today for follow-up of Diabetes Mellitus II, uncontrolled without complication.  Patient currently on medication regimen of Metformin 500 mg daily.  Is taking medications as directed. Endorses also taking a cinnamon supplement daily. Is not checking blood glucose as directed. Is keeping very active and eating a well-balanced diet.   Latest Maintenance: A1C --  Lab Results  Component Value Date   HGBA1C 6.2 05/28/2017   Diabetic Eye Exam -- Has appointment scheduled.  Urine Microalbumin -- UTD. Patient on ARB. Foot Exam -- up-to-date. Denies concerns today.   Past Medical History:  Diagnosis Date  . CAD (coronary artery disease)    cath 11/29/2014 bifurcation Culotte stent to distal RCA and ost PDA treated with 2.75x28 mm Synergy stent, and ost PLA with 2.5x86mm DES, 90% small OM2  . Diabetes mellitus    metformin  . Hyperlipemia   . Hypertension     Current Outpatient Medications on File Prior to Visit  Medication Sig Dispense Refill  . ALPRAZolam (XANAX) 0.5 MG tablet TAKE 1/2 TO 1 TABLET BY MOUTH TWICE A DAY AS NEEDED FOR ANXIETY 15 tablet 2  . amLODipine (NORVASC) 2.5 MG tablet TAKE 1 TABLET (2.5 MG TOTAL) BY MOUTH DAILY. 90 tablet 3  . aspirin 81 MG tablet Take 81 mg by mouth daily.     . Cinnamon 500 MG TABS Take 3 tablets by mouth daily.      . clopidogrel (PLAVIX) 75 MG tablet Take 1 tablet (75 mg total) by mouth daily. 90 tablet 3  . fenofibrate micronized (ANTARA) 130 MG capsule TAKE ONE CAPSULE BY MOUTH EVERY MORNING BEFORE BREAKFAST 90 capsule 1  . losartan (COZAAR) 100 MG tablet Take 1 tablet (100 mg total) by mouth daily. 90 tablet 1  . magnesium oxide (MAG-OX) 400 MG tablet Take 400 mg by mouth daily.    . metFORMIN (GLUCOPHAGE) 500 MG tablet TAKE 1 TABLET BY MOUTH EVERY DAY 30 tablet 3  . metoprolol succinate (TOPROL-XL) 50 MG 24 hr tablet Take 1 tablet (50 mg total) by mouth daily. Take  with or immediately following a meal. 90 tablet 2  . Multiple Vitamin (THERA) TABS Take 1 tablet by mouth daily.    . nitroGLYCERIN (NITROSTAT) 0.4 MG SL tablet Place 1 tablet (0.4 mg total) every 5 (five) minutes as needed under the tongue for chest pain (X3 DOSES BEFORE CALLING 911). 25 tablet 3  . Omega-3 Fatty Acids (FISH OIL) 1200 MG CAPS Take 3 capsules by mouth daily.    . pantoprazole (PROTONIX) 40 MG tablet Take 1 tablet (40 mg total) by mouth daily. 30 tablet 3   No current facility-administered medications on file prior to visit.     Allergies  Allergen Reactions  . Peanut-Containing Drug Products Other (See Comments)    Pine nuts only.  . Codeine Palpitations    Increases heart rate, rapid heart rate  . Niacin Other (See Comments)    unknown  . Penicillins Other (See Comments)    Does not know what reaction - happened as a child Was told not to take as a child.  . Rosuvastatin Other (See Comments)    Night sweats    Family History  Problem Relation Age of Onset  . Heart disease Father   . Hyperlipidemia Father   . Heart attack Maternal Grandfather 90  . Heart attack Paternal Grandfather 25  . Colon cancer Neg Hx   .  Pancreatic cancer Neg Hx   . Stomach cancer Neg Hx   . Hypertension Neg Hx   . Stroke Neg Hx     Social History   Socioeconomic History  . Marital status: Married    Spouse name: Not on file  . Number of children: 2  . Years of education: Not on file  . Highest education level: Not on file  Occupational History  . Occupation: Optometrist: [employer]  Social Needs  . Financial resource strain: Not on file  . Food insecurity:    Worry: Not on file    Inability: Not on file  . Transportation needs:    Medical: Not on file    Non-medical: Not on file  Tobacco Use  . Smoking status: Former Smoker    Types: Cigarettes    Last attempt to quit: 03/18/1998    Years since quitting: 19.4  . Smokeless tobacco: Never Used  .  Tobacco comment: smoked <1ppd for 15 yrs & quit ~2000...  Substance and Sexual Activity  . Alcohol use: No    Alcohol/week: 0.0 oz  . Drug use: No  . Sexual activity: Yes  Lifestyle  . Physical activity:    Days per week: Not on file    Minutes per session: Not on file  . Stress: Not on file  Relationships  . Social connections:    Talks on phone: Not on file    Gets together: Not on file    Attends religious service: Not on file    Active member of club or organization: Not on file    Attends meetings of clubs or organizations: Not on file    Relationship status: Not on file  Other Topics Concern  . Not on file  Social History Narrative  . Not on file   Review of Systems: Pertinent ROS are listed in HPI  Physical Examination: BP 108/72   Pulse 85   Temp 98 F (36.7 C) (Oral)   Resp 14   Ht 5\' 10"  (1.778 m)   Wt 159 lb (72.1 kg)   SpO2 98%   BMI 22.81 kg/m  General appearance: alert, cooperative, appears stated age and no distress Lungs: clear to auscultation bilaterally Heart: regular rate and rhythm, S1, S2 normal, no murmur, click, rub or gallop  Assessment/Plan: Diabetes ([facility]) POC A1C today at 6.0. Well-controlled. Continue current regimen. Patient is scheduling his eye exam as it is overdue. Will follow-up in 6 months. Marland Kitchen

## 2017-09-12 ENCOUNTER — Other Ambulatory Visit: Payer: Self-pay | Admitting: Physician Assistant

## 2017-09-12 NOTE — Telephone Encounter (Signed)
Last OV 09/08/2017, No future OV  Last filled 06/02/2017, # 15 with 2 refills

## 2017-09-27 ENCOUNTER — Other Ambulatory Visit: Payer: Self-pay | Admitting: Physician Assistant

## 2017-10-16 ENCOUNTER — Other Ambulatory Visit: Payer: Self-pay | Admitting: Interventional Cardiology

## 2017-10-21 ENCOUNTER — Other Ambulatory Visit: Payer: Self-pay | Admitting: Interventional Cardiology

## 2017-11-30 ENCOUNTER — Other Ambulatory Visit: Payer: Self-pay | Admitting: Physician Assistant

## 2017-12-04 ENCOUNTER — Telehealth: Payer: Self-pay | Admitting: Interventional Cardiology

## 2017-12-04 NOTE — Telephone Encounter (Signed)
New Message:     Pt wants to know should he have lab work before his appt on 03-05-18 with Dr Morton Amy?

## 2017-12-04 NOTE — Telephone Encounter (Signed)
Returned call to patient. He states that his appointment in December is at 4:00 PM and wanted to know if he needed fasting labs. Made patient aware that he does not need to fast for a 4:00 appt and that his PCP has been checking his labs. Made patient aware that if [name] would like to order fasting labs at the appt then we will have him come in on another day. Patient verbalized understanding and thanked me for the call.

## 2017-12-11 ENCOUNTER — Other Ambulatory Visit: Payer: Self-pay | Admitting: Physician Assistant

## 2017-12-19 ENCOUNTER — Other Ambulatory Visit: Payer: Self-pay | Admitting: Physician Assistant

## 2017-12-19 ENCOUNTER — Encounter: Payer: Self-pay | Admitting: Emergency Medicine

## 2017-12-19 NOTE — Telephone Encounter (Signed)
LMOVM advising patient that rx and CSC is ready at the front desk for pick up.

## 2017-12-19 NOTE — Telephone Encounter (Signed)
Xanax 09/12/17 #15 2 RF CSC: 06/11/16 UDS: 10/16/16 LOV: 09/08/17  Please advise

## 2017-12-26 ENCOUNTER — Other Ambulatory Visit: Payer: Self-pay | Admitting: Emergency Medicine

## 2017-12-26 MED ORDER — ATORVASTATIN CALCIUM 80 MG PO TABS: 80.0000 mg | ORAL_TABLET | Freq: Every day | ORAL | 1 refills | Status: DC

## 2018-02-05 ENCOUNTER — Other Ambulatory Visit: Payer: Self-pay | Admitting: Interventional Cardiology

## 2018-02-05 DIAGNOSIS — E782 Mixed hyperlipidemia: Secondary | ICD-10-CM

## 2018-02-05 NOTE — Telephone Encounter (Signed)
Medication was removed from patients list with error for the reason. Does he still needs to be taking this medication?

## 2018-02-05 NOTE — Telephone Encounter (Signed)
Called and spoke to patient to clarify if he had been taking his zetia. He states that he has been taking his zetia 10 mg QD. Patient states that he stopped taking his lipitor and has been off of it since August. He was previously taking 40 mg QD. Last LDL in 05/2017 was 27. Patient wanting to know if [name] would like fasting labs prior to restarting his lipitor.

## 2018-02-13 NOTE — Telephone Encounter (Signed)
OK to refill

## 2018-02-16 MED ORDER — ATORVASTATIN CALCIUM 20 MG PO TABS: 20.0000 mg | ORAL_TABLET | Freq: Every day | ORAL | 3 refills | Status: DC

## 2018-02-16 MED ORDER — EZETIMIBE 10 MG PO TABS: 10.0000 mg | ORAL_TABLET | Freq: Every day | ORAL | 3 refills | Status: DC

## 2018-02-16 NOTE — Telephone Encounter (Signed)
Discussed with [name]. Patient will restart atorvastatin 20 mg QD and continue zetia 10 mg QD. Patient will repeat LIPIDS and LFTS in 3 months.

## 2018-02-24 ENCOUNTER — Other Ambulatory Visit: Payer: BLUE CROSS/BLUE SHIELD

## 2018-02-24 DIAGNOSIS — E782 Mixed hyperlipidemia: Secondary | ICD-10-CM | POA: Diagnosis not present

## 2018-02-24 LAB — HEPATIC FUNCTION PANEL
ALT: 21 IU/L (ref 0–44)
AST: 23 IU/L (ref 0–40)
Albumin: 4.6 g/dL (ref 3.5–5.5)
Alkaline Phosphatase: 29 IU/L — ABNORMAL LOW (ref 39–117)
Bilirubin Total: 0.4 mg/dL (ref 0.0–1.2)
Bilirubin, Direct: 0.16 mg/dL (ref 0.00–0.40)
TOTAL PROTEIN: 6.9 g/dL (ref 6.0–8.5)

## 2018-02-24 LAB — LIPID PANEL
Chol/HDL Ratio: 3.5 ratio (ref 0.0–5.0)
Cholesterol, Total: 124 mg/dL (ref 100–199)
HDL: 35 mg/dL — ABNORMAL LOW (ref >39)
LDL CALC: 69 mg/dL (ref 0–99)
TRIGLYCERIDES: 98 mg/dL (ref 0–149)
VLDL Cholesterol Cal: 20 mg/dL (ref 5–40)

## 2018-02-24 NOTE — Addendum Note (Signed)
Addended by: Drue Novel I on: 02/24/2018 11:43 AM   Modules accepted: Orders

## 2018-02-24 NOTE — Telephone Encounter (Signed)
Patient presents to the clinic today for fasting labs. Patient states that he thought the he had stopped his atorvastatin but he has actually been taking 40 mg QD and would like his labs to be completed prior to his visit on 12/19.

## 2018-02-25 ENCOUNTER — Other Ambulatory Visit: Payer: Self-pay | Admitting: Interventional Cardiology

## 2018-03-04 ENCOUNTER — Other Ambulatory Visit: Payer: Self-pay | Admitting: Physician Assistant

## 2018-03-05 ENCOUNTER — Ambulatory Visit (INDEPENDENT_AMBULATORY_CARE_PROVIDER_SITE_OTHER): Payer: BLUE CROSS/BLUE SHIELD | Admitting: Interventional Cardiology

## 2018-03-05 ENCOUNTER — Encounter: Payer: Self-pay | Admitting: Interventional Cardiology

## 2018-03-05 VITALS — BP 106/66 | HR 62 | Ht 70.0 in | Wt 169.6 lb

## 2018-03-05 DIAGNOSIS — I10 Essential (primary) hypertension: Secondary | ICD-10-CM

## 2018-03-05 DIAGNOSIS — F419 Anxiety disorder, unspecified: Secondary | ICD-10-CM

## 2018-03-05 DIAGNOSIS — E782 Mixed hyperlipidemia: Secondary | ICD-10-CM | POA: Diagnosis not present

## 2018-03-05 DIAGNOSIS — I2510 Atherosclerotic heart disease of native coronary artery without angina pectoris: Secondary | ICD-10-CM | POA: Diagnosis not present

## 2018-03-05 DIAGNOSIS — I252 Old myocardial infarction: Secondary | ICD-10-CM | POA: Diagnosis not present

## 2018-03-05 NOTE — Patient Instructions (Signed)
Medication Instructions:  Your physician has recommended you make the following change in your medication:   STOP: amlodipine   Continue to monitor Blood Pressure  If you need a refill on your cardiac medications before your next appointment, please call your pharmacy.   Lab work: None Ordered  If you have labs (blood work) drawn today and your tests are completely normal, you will receive your results only by: Marland Kitchen MyChart Message (if you have MyChart) OR . A paper copy in the mail If you have any lab test that is abnormal or we need to change your treatment, we will call you to review the results.  Testing/Procedures: None ordered  Follow-Up: At [facility], you and your health needs are our priority.  As part of our continuing mission to provide you with exceptional heart care, we have created designated Provider Care Teams.  These Care Teams include your primary Cardiologist (physician) and Advanced Practice Providers (APPs -  Physician Assistants and Nurse Practitioners) who all work together to provide you with the care you need, when you need it. . You will need a follow up appointment in 1 year.  Please call our office 2 months in advance to schedule this appointment.  You may see Casandra Doffing, MD or one of the following Advanced Practice Providers on your designated Care Team:   . Lyda Jester, PA-C . Dayna Dunn, PA-C . Ermalinda Barrios, PA-C  Any Other Special Instructions Will Be Listed Below (If Applicable).

## 2018-03-05 NOTE — Progress Notes (Signed)
Cardiology Office Note   Date:  03/05/2018   ID:  Carlos Montgomery, DOB 1966-02-28, MRN [id number]  PCP:  Brunetta Jeans, PA-C    No chief complaint on file.  CAD  Wt Readings from Last 3 Encounters:  03/05/18 169 lb 9.6 oz (76.9 kg)  09/08/17 159 lb (72.1 kg)  05/28/17 158 lb (71.7 kg)       History of Present Illness: Carlos Montgomery is a 52 y.o. male   Who had inferior STEMI in 9/16. He thinks the DOE was building prior to this. He received 2 stents to the RCA. He did well with the procedure.  Has been on DAPT due to bifurcation stenting at the time of STEMI.   Denies : Chest pain. Leg edema. Nitroglycerin use. Orthopnea. Palpitations. Paroxysmal nocturnal dyspnea. Shortness of breath. Syncope.   Occasional dizziness.  Occasional DOE.  Walks 3-4 miles / week.    He has a Building services engineer.    He tries to eat healthy.    Has some stress in his life with a divorce. He has used all of his Xanax for the month.        Past Medical History:  Diagnosis Date  . CAD (coronary artery disease)    cath 11/29/2014 bifurcation Culotte stent to distal RCA and ost PDA treated with 2.75x28 mm Synergy stent, and ost PLA with 2.5x62mm DES, 90% small OM2  . Diabetes mellitus    metformin  . Hyperlipemia   . Hypertension     Past Surgical History:  Procedure Laterality Date  . broken wrists    . CARDIAC CATHETERIZATION N/A 11/29/2014   Procedure: Left Heart Cath and Coronary Angiography;  Surgeon: Jettie Booze, MD;  Location: [facility];  Service: Cardiovascular;  Laterality: N/A;  . CARDIAC CATHETERIZATION N/A 11/29/2014   Procedure: Coronary Stent Intervention;  Surgeon: Jettie Booze, MD;  Location: [facility];  Service: Cardiovascular;  Laterality: N/A;  . RETINAL DETACHMENT SURGERY    . RHINOPLASTY    . TONSILLECTOMY AND ADENOIDECTOMY       Current Outpatient Medications  Medication Sig Dispense Refill  . ALPRAZolam (XANAX) 0.5 MG  tablet TAKE 1/2 TO 1 TABLET BY MOUTH TWICE A DAY AS NEEDED FOR ANXIETY 15 tablet 2  . amLODipine (NORVASC) 2.5 MG tablet TAKE 1 TABLET (2.5 MG TOTAL) BY MOUTH DAILY. 90 tablet 3  . aspirin 81 MG tablet Take 81 mg by mouth daily.     Marland Kitchen atorvastatin (LIPITOR) 40 MG tablet Take 40 mg by mouth daily.    . Cinnamon 500 MG TABS Take 3 tablets by mouth daily.      . clopidogrel (PLAVIX) 75 MG tablet Take 1 tablet (75 mg total) by mouth daily. 30 tablet 0  . ezetimibe (ZETIA) 10 MG tablet Take 1 tablet (10 mg total) by mouth daily. 90 tablet 3  . fenofibrate micronized (ANTARA) 130 MG capsule TAKE 1 CAPSULE BY MOUTH EVERY DAY BEFORE BREAKFAST 90 capsule 1  . losartan (COZAAR) 100 MG tablet TAKE 1 TABLET BY MOUTH EVERY DAY 90 tablet 1  . magnesium oxide (MAG-OX) 400 MG tablet Take 400 mg by mouth daily.    . metFORMIN (GLUCOPHAGE) 500 MG tablet TAKE 1 TABLET BY MOUTH EVERY DAY 30 tablet 3  . metoprolol succinate (TOPROL-XL) 50 MG 24 hr tablet Take 1 tablet (50 mg total) by mouth daily. Take with or immediately following a meal. 90 tablet 2  .  Multiple Vitamin (THERA) TABS Take 1 tablet by mouth daily.    . nitroGLYCERIN (NITROSTAT) 0.4 MG SL tablet Place 1 tablet (0.4 mg total) every 5 (five) minutes as needed under the tongue for chest pain (X3 DOSES BEFORE CALLING 911). 25 tablet 3  . Omega-3 Fatty Acids (FISH OIL) 1200 MG CAPS Take 3 capsules by mouth daily.    . pantoprazole (PROTONIX) 40 MG tablet TAKE 1 TABLET BY MOUTH EVERY DAY 90 tablet 1   No current facility-administered medications for this visit.     Allergies:   Peanut-containing drug products; Codeine; Niacin; Penicillins; and Rosuvastatin    Social History:  The patient  reports that he quit smoking about 19 years ago. His smoking use included cigarettes. He has never used smokeless tobacco. He reports that he does not drink alcohol or use drugs.   Family History:  The patient's family history includes Heart attack (age of onset: 1)  in his paternal grandfather; Heart attack (age of onset: 55) in his maternal grandfather; Heart disease in his father; Hyperlipidemia in his father.    ROS:  Please see the history of present illness.   Otherwise, review of systems are positive for stress.   All other systems are reviewed and negative.    PHYSICAL EXAM: VS:  BP 106/66   Pulse 62   Ht 5\' 10"  (1.778 m)   Wt 169 lb 9.6 oz (76.9 kg)   SpO2 95%   BMI 24.34 kg/m  , BMI Body mass index is 24.34 kg/m. GEN: Well nourished, well developed, in no acute distress  HEENT: normal  Neck: no JVD, carotid bruits, or masses Cardiac: RRR; no murmurs, rubs, or gallops,no edema  Respiratory:  clear to auscultation bilaterally, normal work of breathing GI: soft, nontender, nondistended, + BS MS: no deformity or atrophy  Skin: warm and dry, no rash Neuro:  Strength and sensation are intact Psych: euthymic mood, full affect   EKG:   The ekg ordered today demonstrates NSR, no ST changes   Recent Labs: 05/28/2017: BUN 9; Creatinine, Ser 1.07; Hemoglobin 13.9; Platelets 181.0; Potassium 5.1; Sodium 139 02/24/2018: ALT 21   Lipid Panel    Component Value Date/Time   CHOL 124 02/24/2018 1102   TRIG 98 02/24/2018 1102   HDL 35 (L) 02/24/2018 1102   CHOLHDL 3.5 02/24/2018 1102   CHOLHDL 2 05/28/2017 0932   VLDL 24.8 05/28/2017 0932   LDLCALC 69 02/24/2018 1102   LDLDIRECT 79.0 06/05/2015 1023     Other studies Reviewed: Additional studies/ records that were reviewed today with results demonstrating: LDL 69 in 12/19.   ASSESSMENT AND PLAN:  1. CAD/MI: No angina.  Continue aggressive secondary prevention.   COntinue DAPT due to bifurcation stents done at the time of STEMI.  COuld consider stopping aspirin.  2. Hyperlipidemia: Lipids well controlled in 12/19. Continue lipid lowering therapy.  3. HTN: The current medical regimen is effective;  BP may be getting low with his dizziness.  Will stop amlodipine.  Chcek BP at home.  He  will let us know if it increases > 140/09. 4. Daytime sleepiness: Improved.  5.   Anxiety: He is having anxiety while going through a divorce.  He has run out of his Xanax.  He would like more.  I asked him to check with his PCP about getting an increase in supply during the stressful time.   Current medicines are reviewed at length with the patient today.  The patient concerns regarding his  medicines were addressed.  The following changes have been made:  Stop amlodipine  Labs/ tests ordered today include:  No orders of the defined types were placed in this encounter.   Recommend 150 minutes/week of aerobic exercise Low fat, low carb, high fiber diet recommended  Disposition:   FU in 1 year   Signed, Larae Grooms, MD  03/05/2018 4:30 PM    [facility] [address], [place], [place]  [address] Phone: [phone]; Fax: [phone]

## 2018-04-07 ENCOUNTER — Other Ambulatory Visit: Payer: Self-pay | Admitting: Physician Assistant

## 2018-04-07 NOTE — Telephone Encounter (Signed)
Last refill:12/19/17 #15, 2 Last OV:05/28/17

## 2018-04-18 ENCOUNTER — Other Ambulatory Visit: Payer: Self-pay | Admitting: Interventional Cardiology

## 2018-04-18 ENCOUNTER — Other Ambulatory Visit: Payer: Self-pay | Admitting: Physician Assistant

## 2018-04-19 ENCOUNTER — Other Ambulatory Visit: Payer: Self-pay | Admitting: Physician Assistant

## 2018-04-22 ENCOUNTER — Telehealth: Payer: Self-pay | Admitting: Physician Assistant

## 2018-04-22 NOTE — Telephone Encounter (Signed)
Copied from [place] [id number]. Topic: General - Other >> Apr 22, 2018  9:49 AM Keene Breath wrote: Reason for CRM: Patient called to ask the doctor if he needs to have blood work done before he can get another refill on his Metformin.  Patient stated that he just had labs done by Dr. Hassell Done office less than a month ago and wanted to know if Dr. Hassell Done could use those results.  Please advise and call patient back at [phone] or [phone]

## 2018-04-22 NOTE — Telephone Encounter (Signed)
Called patient to let him know that medication was called in on 04/21/2018.   No answer and mailbox was full so I could not leave message.    Once labs are due, we are not able to use them from the Cardiologist because they are different labs for that medication. They only checked his cholesterol.   Okay for PEC to discuss this with patient and let him know that medication was called in.

## 2018-05-18 ENCOUNTER — Other Ambulatory Visit: Payer: Self-pay | Admitting: Physician Assistant

## 2018-05-18 NOTE — Telephone Encounter (Signed)
Per pharmacy 100 mg and 50 mg on back order Please advise

## 2018-05-19 ENCOUNTER — Other Ambulatory Visit: Payer: Self-pay | Admitting: Physician Assistant

## 2018-05-19 NOTE — Telephone Encounter (Signed)
Please let patient know.  Can consider switching to Diovan 160 mg (if not on backorder). Would like him to keep a close eye on home BP and call in 1 week with these numbers.

## 2018-05-19 NOTE — Telephone Encounter (Signed)
Last refill:04/07/18 #15, 0 Last OV:09/08/17

## 2018-05-20 ENCOUNTER — Other Ambulatory Visit: Payer: Self-pay | Admitting: Physician Assistant

## 2018-05-20 ENCOUNTER — Other Ambulatory Visit: Payer: BLUE CROSS/BLUE SHIELD

## 2018-05-20 DIAGNOSIS — I10 Essential (primary) hypertension: Secondary | ICD-10-CM

## 2018-05-20 MED ORDER — VALSARTAN 160 MG PO TABS: 160.0000 mg | ORAL_TABLET | Freq: Every day | ORAL | 3 refills | Status: DC

## 2018-05-20 NOTE — Telephone Encounter (Signed)
Advised patient that Losartan is on back order. Per PCP can switch to Diovan 160 mg daily. Rx sent to the pharmacy. Patient has appointment on Friday for DM check.

## 2018-05-22 ENCOUNTER — Encounter: Payer: Self-pay | Admitting: Physician Assistant

## 2018-05-22 ENCOUNTER — Ambulatory Visit: Payer: BLUE CROSS/BLUE SHIELD | Admitting: Physician Assistant

## 2018-05-22 ENCOUNTER — Other Ambulatory Visit: Payer: Self-pay

## 2018-05-22 VITALS — BP 114/80 | HR 85 | Temp 98.3°F | Resp 14 | Ht 70.0 in | Wt 172.0 lb

## 2018-05-22 DIAGNOSIS — E119 Type 2 diabetes mellitus without complications: Secondary | ICD-10-CM

## 2018-05-22 DIAGNOSIS — I10 Essential (primary) hypertension: Secondary | ICD-10-CM | POA: Diagnosis not present

## 2018-05-22 LAB — COMPREHENSIVE METABOLIC PANEL
ALBUMIN: 4.9 g/dL (ref 3.5–5.2)
ALT: 39 U/L (ref 0–53)
AST: 41 U/L — ABNORMAL HIGH (ref 0–37)
Alkaline Phosphatase: 30 U/L — ABNORMAL LOW (ref 39–117)
BUN: 15 mg/dL (ref 6–23)
CO2: 27 mEq/L (ref 19–32)
Calcium: 9.9 mg/dL (ref 8.4–10.5)
Chloride: 103 mEq/L (ref 96–112)
Creatinine, Ser: 1.2 mg/dL (ref 0.40–1.50)
GFR: 63.91 mL/min (ref >60.00)
GLUCOSE: 64 mg/dL — AB (ref 70–99)
Potassium: 4.9 mEq/L (ref 3.5–5.1)
Sodium: 140 mEq/L (ref 135–145)
Total Bilirubin: 0.6 mg/dL (ref 0.2–1.2)
Total Protein: 7.2 g/dL (ref 6.0–8.3)

## 2018-05-22 LAB — LIPID PANEL
Cholesterol: 108 mg/dL (ref 0–200)
HDL: 35.7 mg/dL — ABNORMAL LOW (ref >39.00)
LDL Cholesterol: 39 mg/dL (ref 0–99)
NonHDL: 72.62
Total CHOL/HDL Ratio: 3
Triglycerides: 168 mg/dL — ABNORMAL HIGH (ref 0.0–149.0)
VLDL: 33.6 mg/dL (ref 0.0–40.0)

## 2018-05-22 LAB — HEMOGLOBIN A1C: Hgb A1c MFr Bld: 6.7 % — ABNORMAL HIGH (ref 4.6–6.5)

## 2018-05-22 NOTE — Assessment & Plan Note (Signed)
Referral to Ophthalmology placed. Foot exam updated -- no abnormal findings. Declines pneumonia vaccine today. Continue ARB therapy. Continue current regimen.  Repeat fasting labs today.

## 2018-05-22 NOTE — Progress Notes (Signed)
History of Present Illness: Patient is a 53 y.o. male who presents to clinic today for follow-up of Diabetes Mellitus II, previously controlled without complication.  Patient currently on medication regimen of Metformin 500 mg daily.  Endorses taking medications as directed. Endorses watching diet and keeping active.  Denies vision changes, polyuria, polydipsia or polyphagia. Is not checking blood glucose as directed.  Latest Maintenance: A1C --  Lab Results  Component Value Date   HGBA1C 6.0 (A) 09/08/2017   Diabetic Eye Exam -- Due. Would like new specialist. Denies changes in vision..  Urine Microalbumin -- on ARB Foot Exam -- Due. Denies concerns today.  Past Medical History:  Diagnosis Date  . CAD (coronary artery disease)    cath 11/29/2014 bifurcation Culotte stent to distal RCA and ost PDA treated with 2.75x28 mm Synergy stent, and ost PLA with 2.5x76mm DES, 90% small OM2  . Diabetes mellitus    metformin  . Hyperlipemia   . Hypertension     Current Outpatient Medications on File Prior to Visit  Medication Sig Dispense Refill  . ALPRAZolam (XANAX) 0.5 MG tablet TAKE 1/2 TO 1 TAB BY MOUTH TWICE A DAY AS NEEDED FOR ANXIETY 15 tablet 0  . aspirin 81 MG tablet Take 81 mg by mouth daily.     Marland Kitchen atorvastatin (LIPITOR) 40 MG tablet Take 40 mg by mouth daily.    . Cinnamon 500 MG TABS Take 3 tablets by mouth daily.      . clopidogrel (PLAVIX) 75 MG tablet TAKE 1 TABLET BY MOUTH EVERY DAY 90 tablet 2  . ezetimibe (ZETIA) 10 MG tablet Take 1 tablet (10 mg total) by mouth daily. 90 tablet 3  . fenofibrate micronized (ANTARA) 130 MG capsule TAKE 1 CAPSULE BY MOUTH EVERY DAY BEFORE BREAKFAST, Overdue for physical, schedule appointment 30 capsule 0  . magnesium oxide (MAG-OX) 400 MG tablet Take 400 mg by mouth daily.    . metFORMIN (GLUCOPHAGE) 500 MG tablet TAKE 1 TABLET BY MOUTH EVERY DAY. DUE FOR PHYSICAL, PLEASE CALL OFFICE TO SCHEDULE 30 tablet 0  . metoprolol succinate (TOPROL-XL)  50 MG 24 hr tablet Take 1 tablet (50 mg total) by mouth daily. Take with or immediately following a meal. 90 tablet 2  . Multiple Vitamin (THERA) TABS Take 1 tablet by mouth daily.    . nitroGLYCERIN (NITROSTAT) 0.4 MG SL tablet Place 1 tablet (0.4 mg total) every 5 (five) minutes as needed under the tongue for chest pain (X3 DOSES BEFORE CALLING 911). 25 tablet 3  . Omega-3 Fatty Acids (FISH OIL) 1200 MG CAPS Take 3 capsules by mouth daily.    . pantoprazole (PROTONIX) 40 MG tablet TAKE 1 TABLET BY MOUTH EVERY DAY 90 tablet 1  . valsartan (DIOVAN) 160 MG tablet Take 1 tablet (160 mg total) by mouth daily. (Patient not taking: Reported on 05/22/2018) 30 tablet 3   No current facility-administered medications on file prior to visit.     Allergies  Allergen Reactions  . Peanut-Containing Drug Products Other (See Comments)    Pine nuts only.  . Codeine Palpitations    Increases heart rate, rapid heart rate  . Niacin Other (See Comments)    unknown  . Penicillins Other (See Comments)    Does not know what reaction - happened as a child Was told not to take as a child.  . Rosuvastatin Other (See Comments)    Night sweats    Family History  Problem Relation Age of Onset  .  Heart disease Father   . Hyperlipidemia Father   . Heart attack Maternal Grandfather 90  . Heart attack Paternal Grandfather 51  . Colon cancer Neg Hx   . Pancreatic cancer Neg Hx   . Stomach cancer Neg Hx   . Hypertension Neg Hx   . Stroke Neg Hx     Social History   Socioeconomic History  . Marital status: Married    Spouse name: Not on file  . Number of children: 2  . Years of education: Not on file  . Highest education level: Not on file  Occupational History  . Occupation: Optometrist: [employer]  Social Needs  . Financial resource strain: Not on file  . Food insecurity:    Worry: Not on file    Inability: Not on file  . Transportation needs:    Medical: Not on file     Non-medical: Not on file  Tobacco Use  . Smoking status: Former Smoker    Types: Cigarettes    Last attempt to quit: 03/18/1998    Years since quitting: 20.1  . Smokeless tobacco: Never Used  . Tobacco comment: smoked <1ppd for 15 yrs & quit ~2000...  Substance and Sexual Activity  . Alcohol use: No    Alcohol/week: 0.0 standard drinks  . Drug use: No  . Sexual activity: Yes  Lifestyle  . Physical activity:    Days per week: Not on file    Minutes per session: Not on file  . Stress: Not on file  Relationships  . Social connections:    Talks on phone: Not on file    Gets together: Not on file    Attends religious service: Not on file    Active member of club or organization: Not on file    Attends meetings of clubs or organizations: Not on file    Relationship status: Not on file  Other Topics Concern  . Not on file  Social History Narrative  . Not on file   Review of Systems: Pertinent ROS are listed in HPI  Physical Examination: BP 114/80   Pulse 85   Temp 98.3 F (36.8 C) (Oral)   Resp 14   Ht 5\' 10"  (1.778 m)   Wt 172 lb (78 kg)   SpO2 98%   BMI 24.68 kg/m  General appearance: alert, cooperative and appears stated age Head: Normocephalic, without obvious abnormality, atraumatic Ears: normal TM's and external ear canals both ears Nose: Nares normal. Septum midline. Mucosa normal. No drainage or sinus tenderness. Throat: lips, mucosa, and tongue normal; teeth and gums normal Lungs: clear to auscultation bilaterally Heart: regular rate and rhythm, S1, S2 normal, no murmur, click, rub or gallop Extremities: extremities normal, atraumatic, no cyanosis or edema Pulses: 2+ and symmetric Skin: Skin color, texture, turgor normal. No rashes or lesions  Diabetic Foot Exam - Simple   Simple Foot Form Diabetic Foot exam was performed with the following findings:  Yes 05/22/2018 10:08 AM  Visual Inspection Sensation Testing Pulse Check Comments      Assessment/Plan: Essential hypertension BP normotensive. Asymptomatic. Continue current regimen. Repeat labs today.  Diabetes [facility]) Referral to Ophthalmology placed. Foot exam updated -- no abnormal findings. Declines pneumonia vaccine today. Continue ARB therapy. Continue current regimen.  Repeat fasting labs today.

## 2018-05-22 NOTE — Patient Instructions (Addendum)
Please go to the lab today for blood work.  I will call you with your results. We will alter treatment regimen(s) if indicated by your results.   Use the handout given to schedule an appointment with counseling.  I have edited the quantity of your Alprazolam for now.  Hang in there!

## 2018-05-22 NOTE — Assessment & Plan Note (Signed)
BP normotensive. Asymptomatic. Continue current regimen. Repeat labs today.

## 2018-05-26 ENCOUNTER — Other Ambulatory Visit: Payer: Self-pay | Admitting: Physician Assistant

## 2018-05-27 NOTE — Telephone Encounter (Signed)
Xanax last rx 04/07/18 #15 Last OV quantity will be changed  CSC: 01/01/18

## 2018-06-18 ENCOUNTER — Other Ambulatory Visit: Payer: Self-pay | Admitting: Physician Assistant

## 2018-06-29 ENCOUNTER — Other Ambulatory Visit: Payer: Self-pay | Admitting: Physician Assistant

## 2018-06-29 NOTE — Telephone Encounter (Signed)
Xanax last rx 05/27/18 #45 Last OV: 05/22/18  Please advise

## 2018-07-25 ENCOUNTER — Other Ambulatory Visit: Payer: Self-pay | Admitting: Physician Assistant

## 2018-07-25 DIAGNOSIS — I10 Essential (primary) hypertension: Secondary | ICD-10-CM

## 2018-08-08 ENCOUNTER — Other Ambulatory Visit: Payer: Self-pay | Admitting: Physician Assistant

## 2018-08-13 ENCOUNTER — Other Ambulatory Visit: Payer: Self-pay | Admitting: Physician Assistant

## 2018-08-13 NOTE — Telephone Encounter (Signed)
Xanax last rx 06/29/18 #45 LOV: 05/22/18 CSC:12/19/17 UDS:10/16/16

## 2018-09-12 ENCOUNTER — Other Ambulatory Visit: Payer: Self-pay | Admitting: Physician Assistant

## 2018-10-16 ENCOUNTER — Other Ambulatory Visit: Payer: Self-pay | Admitting: Physician Assistant

## 2018-10-16 NOTE — Telephone Encounter (Signed)
Xanax last rx 09/14/18 #45 LOV: 05/22/18 CSC: 12/23/17  Please advise

## 2018-10-20 LAB — HM DIABETES EYE EXAM

## 2018-11-08 ENCOUNTER — Other Ambulatory Visit: Payer: Self-pay | Admitting: Interventional Cardiology

## 2018-11-16 ENCOUNTER — Other Ambulatory Visit: Payer: Self-pay | Admitting: Physician Assistant

## 2018-11-16 NOTE — Telephone Encounter (Signed)
Xanax 10/16/18 #45 LOV: 05/22/18 CSC: 12/23/17 UDS: 10/16/16

## 2018-12-03 ENCOUNTER — Other Ambulatory Visit: Payer: Self-pay

## 2018-12-03 ENCOUNTER — Other Ambulatory Visit: Payer: Self-pay | Admitting: Physician Assistant

## 2018-12-03 MED ORDER — ATORVASTATIN CALCIUM 40 MG PO TABS: 40.0000 mg | ORAL_TABLET | Freq: Every day | ORAL | 0 refills | Status: DC

## 2018-12-06 ENCOUNTER — Other Ambulatory Visit: Payer: Self-pay | Admitting: Physician Assistant

## 2018-12-16 ENCOUNTER — Encounter: Payer: Self-pay | Admitting: Physician Assistant

## 2018-12-16 ENCOUNTER — Ambulatory Visit (INDEPENDENT_AMBULATORY_CARE_PROVIDER_SITE_OTHER): Payer: BC Managed Care – PPO | Admitting: Physician Assistant

## 2018-12-16 ENCOUNTER — Other Ambulatory Visit: Payer: Self-pay

## 2018-12-16 VITALS — BP 118/84 | HR 87 | Temp 98.2°F | Resp 16 | Ht 70.0 in | Wt 172.0 lb

## 2018-12-16 DIAGNOSIS — E119 Type 2 diabetes mellitus without complications: Secondary | ICD-10-CM

## 2018-12-16 LAB — HEMOGLOBIN A1C: Hgb A1c MFr Bld: 7 % — ABNORMAL HIGH (ref 4.6–6.5)

## 2018-12-16 LAB — BASIC METABOLIC PANEL
BUN: 9 mg/dL (ref 6–23)
CO2: 24 mEq/L (ref 19–32)
Calcium: 9.7 mg/dL (ref 8.4–10.5)
Chloride: 94 mEq/L — ABNORMAL LOW (ref 96–112)
Creatinine, Ser: 1.12 mg/dL (ref 0.40–1.50)
GFR: 69.05 mL/min (ref >60.00)
Glucose, Bld: 72 mg/dL (ref 70–99)
Potassium: 5.1 mEq/L (ref 3.5–5.1)
Sodium: 129 mEq/L — ABNORMAL LOW (ref 135–145)

## 2018-12-16 NOTE — Progress Notes (Signed)
History of Present Illness: Patient is a 53 y.o. male who presents to clinic today for follow-up of Diabetes Mellitus II, controlled.  Patient currently on medication regimen of Metformin 500 mg QD, Atorvastatin 40 mg QD and Valsartan 160 mg QD. Endorses taking medications as directed.  Denies polyuria, polydipsia or polyphagia..   Latest Maintenance: A1C --  Lab Results  Component Value Date   HGBA1C 6.7 (H) 05/22/2018   Diabetic Eye Exam -- UTD. Will need to get records. ([facility]) Urine Microalbumin -- On ARB Foot Exam -- UTD. No new concerns.   Past Medical History:  Diagnosis Date  . CAD (coronary artery disease)    cath 11/29/2014 bifurcation Culotte stent to distal RCA and ost PDA treated with 2.75x28 mm Synergy stent, and ost PLA with 2.5x41mm DES, 90% small OM2  . Diabetes mellitus    metformin  . Hyperlipemia   . Hypertension     Current Outpatient Medications on File Prior to Visit  Medication Sig Dispense Refill  . ALPRAZolam (XANAX) 0.5 MG tablet TAKE 1/2 TO 1 TAB BY MOUTH TWICE A DAY AS NEEDED FOR ANXIETY 45 tablet 0  . aspirin 81 MG tablet Take 81 mg by mouth daily.     Marland Kitchen atorvastatin (LIPITOR) 40 MG tablet Take 1 tablet (40 mg total) by mouth daily. 90 tablet 0  . Cinnamon 500 MG TABS Take 3 tablets by mouth daily.      . clopidogrel (PLAVIX) 75 MG tablet TAKE 1 TABLET BY MOUTH EVERY DAY 90 tablet 2  . ezetimibe (ZETIA) 10 MG tablet Take 1 tablet (10 mg total) by mouth daily. 90 tablet 3  . fenofibrate micronized (ANTARA) 130 MG capsule TAKE 1 CAPSULE BY MOUTH EVERY DAY BEFORE BREAKFAST 30 capsule 0  . magnesium oxide (MAG-OX) 400 MG tablet Take 400 mg by mouth daily.    . metFORMIN (GLUCOPHAGE) 500 MG tablet TAKE 1 TABLET BY MOUTH EVERY DAY 30 tablet 0  . metoprolol succinate (TOPROL-XL) 50 MG 24 hr tablet Take 1 tablet (50 mg total) by mouth daily. Take with or immediately following a meal. 90 tablet 1  . Multiple Vitamin (THERA) TABS Take 1 tablet by  mouth daily.    . nitroGLYCERIN (NITROSTAT) 0.4 MG SL tablet Place 1 tablet (0.4 mg total) every 5 (five) minutes as needed under the tongue for chest pain (X3 DOSES BEFORE CALLING 911). 25 tablet 3  . Omega-3 Fatty Acids (FISH OIL) 1200 MG CAPS Take 3 capsules by mouth daily.    . pantoprazole (PROTONIX) 40 MG tablet TAKE 1 TABLET BY MOUTH EVERY DAY 30 tablet 5  . valsartan (DIOVAN) 160 MG tablet TAKE 1 TABLET BY MOUTH EVERY DAY 90 tablet 1   No current facility-administered medications on file prior to visit.     Allergies  Allergen Reactions  . Peanut-Containing Drug Products Other (See Comments)    Pine nuts only.  . Codeine Palpitations    Increases heart rate, rapid heart rate  . Niacin Other (See Comments)    unknown  . Penicillins Other (See Comments)    Does not know what reaction - happened as a child Was told not to take as a child.  . Rosuvastatin Other (See Comments)    Night sweats    Family History  Problem Relation Age of Onset  . Heart disease Father   . Hyperlipidemia Father   . Heart attack Maternal Grandfather 90  . Heart attack Paternal Grandfather 37  . Colon  cancer Neg Hx   . Pancreatic cancer Neg Hx   . Stomach cancer Neg Hx   . Hypertension Neg Hx   . Stroke Neg Hx     Social History   Socioeconomic History  . Marital status: Married    Spouse name: Not on file  . Number of children: 2  . Years of education: Not on file  . Highest education level: Not on file  Occupational History  . Occupation: Optometrist: [employer]  Social Needs  . Financial resource strain: Not on file  . Food insecurity    Worry: Not on file    Inability: Not on file  . Transportation needs    Medical: Not on file    Non-medical: Not on file  Tobacco Use  . Smoking status: Former Smoker    Types: Cigarettes    Quit date: 03/18/1998    Years since quitting: 20.7  . Smokeless tobacco: Never Used  . Tobacco comment: smoked <1ppd for 15 yrs &  quit ~2000...  Substance and Sexual Activity  . Alcohol use: No    Alcohol/week: 0.0 standard drinks  . Drug use: No  . Sexual activity: Yes  Lifestyle  . Physical activity    Days per week: Not on file    Minutes per session: Not on file  . Stress: Not on file  Relationships  . Social Herbalist on phone: Not on file    Gets together: Not on file    Attends religious service: Not on file    Active member of club or organization: Not on file    Attends meetings of clubs or organizations: Not on file    Relationship status: Not on file  Other Topics Concern  . Not on file  Social History Narrative  . Not on file   Review of Systems: Pertinent ROS are listed in HPI  Physical Examination: BP 118/84   Pulse 87   Temp 98.2 F (36.8 C) (Temporal)   Resp 16   Ht 5\' 10"  (1.778 m)   Wt 172 lb (78 kg)   SpO2 98%   BMI 24.68 kg/m  General appearance: alert, cooperative, appears stated age and no distress Lungs: clear to auscultation bilaterally Heart: regular rate and rhythm, S1, S2 normal, no murmur, click, rub or gallop Skin: Skin color, texture, turgor normal. No rashes or lesions Neurologic: Alert and oriented X 3, normal strength and tone. Normal symmetric reflexes. Normal coordination and gait   Assessment/Plan: 1. Type 2 diabetes mellitus without complication, without long-term current use of insulin ([place]) Controlled previously. Compliant with medications. Keeps active. Eye exam and foot exam UTD. Declines flu and pneumonia vaccine. Repeat labs today. - Basic metabolic panel - Hemoglobin A1c

## 2018-12-16 NOTE — Addendum Note (Signed)
Addended by: Jodell Cipro on: 12/16/2018 09:32 AM   Modules accepted: Orders

## 2018-12-16 NOTE — Patient Instructions (Signed)
Please go to the lab today for blood work.  I will call you with your results. We will alter treatment regimen(s) if indicated by your results.   Hang in there!   Diabetes Mellitus and Exercise Exercising regularly is important for your overall health, especially when you have diabetes (diabetes mellitus). Exercising is not only about losing weight. It has many other health benefits, such as increasing muscle strength and bone density and reducing body fat and stress. This leads to improved fitness, flexibility, and endurance, all of which result in better overall health. Exercise has additional benefits for people with diabetes, including:  Reducing appetite.  Helping to lower and control blood glucose.  Lowering blood pressure.  Helping to control amounts of fatty substances (lipids) in the blood, such as cholesterol and triglycerides.  Helping the body to respond better to insulin (improving insulin sensitivity).  Reducing how much insulin the body needs.  Decreasing the risk for heart disease by: ? Lowering cholesterol and triglyceride levels. ? Increasing the levels of good cholesterol. ? Lowering blood glucose levels. What is my activity plan? Your health care provider or certified diabetes educator can help you make a plan for the type and frequency of exercise (activity plan) that works for you. Make sure that you:  Do at least 150 minutes of moderate-intensity or vigorous-intensity exercise each week. This could be brisk walking, biking, or water aerobics. ? Do stretching and strength exercises, such as yoga or weightlifting, at least 2 times a week. ? Spread out your activity over at least 3 days of the week.  Get some form of physical activity every day. ? Do not go more than 2 days in a row without some kind of physical activity. ? Avoid being inactive for more than 30 minutes at a time. Take frequent breaks to walk or stretch.  Choose a type of exercise or activity  that you enjoy, and set realistic goals.  Start slowly, and gradually increase the intensity of your exercise over time. What do I need to know about managing my diabetes?   Check your blood glucose before and after exercising. ? If your blood glucose is 240 mg/dL (13.3 mmol/L) or higher before you exercise, check your urine for ketones. If you have ketones in your urine, do not exercise until your blood glucose returns to normal. ? If your blood glucose is 100 mg/dL (5.6 mmol/L) or lower, eat a snack containing 15-20 grams of carbohydrate. Check your blood glucose 15 minutes after the snack to make sure that your level is above 100 mg/dL (5.6 mmol/L) before you start your exercise.  Know the symptoms of low blood glucose (hypoglycemia) and how to treat it. Your risk for hypoglycemia increases during and after exercise. Common symptoms of hypoglycemia can include: ? Hunger. ? Anxiety. ? Sweating and feeling clammy. ? Confusion. ? Dizziness or feeling light-headed. ? Increased heart rate or palpitations. ? Blurry vision. ? Tingling or numbness around the mouth, lips, or tongue. ? Tremors or shakes. ? Irritability.  Keep a rapid-acting carbohydrate snack available before, during, and after exercise to help prevent or treat hypoglycemia.  Avoid injecting insulin into areas of the body that are going to be exercised. For example, avoid injecting insulin into: ? The arms, when playing tennis. ? The legs, when jogging.  Keep records of your exercise habits. Doing this can help you and your health care provider adjust your diabetes management plan as needed. Write down: ? Food that you eat before  and after you exercise. ? Blood glucose levels before and after you exercise. ? The type and amount of exercise you have done. ? When your insulin is expected to peak, if you use insulin. Avoid exercising at times when your insulin is peaking.  When you start a new exercise or activity, work with  your health care provider to make sure the activity is safe for you, and to adjust your insulin, medicines, or food intake as needed.  Drink plenty of water while you exercise to prevent dehydration or heat stroke. Drink enough fluid to keep your urine clear or pale yellow. Summary  Exercising regularly is important for your overall health, especially when you have diabetes (diabetes mellitus).  Exercising has many health benefits, such as increasing muscle strength and bone density and reducing body fat and stress.  Your health care provider or certified diabetes educator can help you make a plan for the type and frequency of exercise (activity plan) that works for you.  When you start a new exercise or activity, work with your health care provider to make sure the activity is safe for you, and to adjust your insulin, medicines, or food intake as needed. This information is not intended to replace advice given to you by your health care provider. Make sure you discuss any questions you have with your health care provider. Document Released: 05/25/2003 Document Revised: 09/26/2016 Document Reviewed: 08/14/2015 Elsevier Patient Education  2020 [employer].

## 2018-12-17 ENCOUNTER — Other Ambulatory Visit: Payer: Self-pay | Admitting: Physician Assistant

## 2018-12-17 NOTE — Telephone Encounter (Signed)
Xanax last rx 11/17/18 #45  CSC: 12/23/17  UDS: 10/16/16 LOV: 12/16/18

## 2018-12-18 ENCOUNTER — Other Ambulatory Visit: Payer: Self-pay | Admitting: Emergency Medicine

## 2018-12-18 DIAGNOSIS — E871 Hypo-osmolality and hyponatremia: Secondary | ICD-10-CM

## 2018-12-18 DIAGNOSIS — E119 Type 2 diabetes mellitus without complications: Secondary | ICD-10-CM

## 2018-12-21 ENCOUNTER — Encounter: Payer: Self-pay | Admitting: Emergency Medicine

## 2018-12-23 ENCOUNTER — Encounter: Payer: Self-pay | Admitting: Physician Assistant

## 2019-01-05 ENCOUNTER — Other Ambulatory Visit: Payer: Self-pay | Admitting: Physician Assistant

## 2019-01-05 NOTE — Telephone Encounter (Signed)
Pt stated pharmacy never got refills for the Fenofibrate and the metformin can we resend these in for the pt, he is out of these medications.

## 2019-01-16 ENCOUNTER — Other Ambulatory Visit: Payer: Self-pay | Admitting: Interventional Cardiology

## 2019-02-01 ENCOUNTER — Other Ambulatory Visit: Payer: Self-pay | Admitting: Physician Assistant

## 2019-02-02 ENCOUNTER — Other Ambulatory Visit: Payer: Self-pay | Admitting: Physician Assistant

## 2019-02-02 DIAGNOSIS — I10 Essential (primary) hypertension: Secondary | ICD-10-CM

## 2019-02-11 ENCOUNTER — Other Ambulatory Visit: Payer: Self-pay | Admitting: Interventional Cardiology

## 2019-02-16 ENCOUNTER — Other Ambulatory Visit: Payer: Self-pay | Admitting: Physician Assistant

## 2019-02-16 NOTE — Telephone Encounter (Signed)
Xanax last rx 01/06/19 #45 LOV: 12/16/18 DM CSC: 12/23/17

## 2019-03-11 ENCOUNTER — Other Ambulatory Visit: Payer: Self-pay | Admitting: Interventional Cardiology

## 2019-03-14 NOTE — Progress Notes (Signed)
Cardiology Office Note   Date:  03/17/2019   ID:  Carlos Montgomery, DOB 1965-04-13, MRN [id number]  PCP:  Brunetta Jeans, PA-C    No chief complaint on file.  CAD/Old MI  Wt Readings from Last 3 Encounters:  03/17/19 167 lb (75.8 kg)  12/16/18 172 lb (78 kg)  05/22/18 172 lb (78 kg)       History of Present Illness: Carlos Montgomery is a 53 y.o. male  Who had inferior STEMI in 9/16. He thinks the DOE was building prior to this. He received 2 stents to the RCA. He did well with the procedure.  Has been on DAPT due to bifurcation stenting at the time of STEMI.   In 02/2018: "Has some stress in his life with a divorce. He has used all of his Xanax for the month. "  Since the last visit, he has felt well.  Denies : Chest pain. Dizziness. Leg edema. Nitroglycerin use. Orthopnea. Palpitations. Paroxysmal nocturnal dyspnea. Shortness of breath. Syncope.     Past Medical History:  Diagnosis Date  . CAD (coronary artery disease)    cath 11/29/2014 bifurcation Cul[place] stent to distal RCA and ost PDA treated with 2.75x28 mm Synergy stent, and ost PLA with 2.5x23mm DES, 90% small OM2  . Diabetes mellitus    metformin  . Hyperlipemia   . Hypertension     Past Surgical History:  Procedure Laterality Date  . broken wrists    . CARDIAC CATHETERIZATION N/A 11/29/2014   Procedure: Left Heart Cath and Coronary Angiography;  Surgeon: Jettie Booze, MD;  Location: [facility];  Service: Cardiovascular;  Laterality: N/A;  . CARDIAC CATHETERIZATION N/A 11/29/2014   Procedure: Coronary Stent Intervention;  Surgeon: Jettie Booze, MD;  Location: [facility];  Service: Cardiovascular;  Laterality: N/A;  . RETINAL DETACHMENT SURGERY    . RHINOPLASTY    . TONSILLECTOMY AND ADENOIDECTOMY       Current Outpatient Medications  Medication Sig Dispense Refill  . ALPRAZolam (XANAX) 0.5 MG tablet TAKE 1/2 TO 1 TAB BY MOUTH TWICE A DAY AS NEEDED FOR ANXIETY 45  tablet 1  . aspirin 81 MG tablet Take 81 mg by mouth daily.     Marland Kitchen atorvastatin (LIPITOR) 40 MG tablet Take 1 tablet (40 mg total) by mouth daily. 90 tablet 0  . Cinnamon 500 MG TABS Take 3 tablets by mouth daily.      . clopidogrel (PLAVIX) 75 MG tablet TAKE 1 TABLET (75 MG TOTAL) BY MOUTH DAILY. PLEASE KEEP UPCOMING APPT FOR FURTHER REFILLS. THANK YOU 30 tablet 0  . ezetimibe (ZETIA) 10 MG tablet Take 1 tablet (10 mg total) by mouth daily. Please keep upcoming appt for future refills. 30 tablet 0  . fenofibrate micronized (ANTARA) 130 MG capsule TAKE 1 CAPSULE BY MOUTH EVERY DAY BEFORE BREAKFAST 90 capsule 1  . magnesium oxide (MAG-OX) 400 MG tablet Take 400 mg by mouth daily.    . metFORMIN (GLUCOPHAGE) 500 MG tablet TAKE 1 TABLET BY MOUTH EVERY DAY 90 tablet 1  . metoprolol succinate (TOPROL-XL) 50 MG 24 hr tablet Take 1 tablet (50 mg total) by mouth daily. Take with or immediately following a meal. 90 tablet 1  . Multiple Vitamin (THERA) TABS Take 1 tablet by mouth daily.    . nitroGLYCERIN (NITROSTAT) 0.4 MG SL tablet Place 1 tablet (0.4 mg total) every 5 (five) minutes as needed under the tongue for chest pain (X3  DOSES BEFORE CALLING 911). 25 tablet 3  . Omega-3 Fatty Acids (FISH OIL) 1200 MG CAPS Take 3 capsules by mouth daily.    . pantoprazole (PROTONIX) 40 MG tablet TAKE 1 TABLET BY MOUTH EVERY DAY 30 tablet 5  . valsartan (DIOVAN) 160 MG tablet TAKE 1 TABLET BY MOUTH EVERY DAY 90 tablet 1   No current facility-administered medications for this visit.    Allergies:   Peanut-containing drug products, Codeine, Niacin, Penicillins, and Rosuvastatin    Social History:  The patient  reports that he quit smoking about 21 years ago. His smoking use included cigarettes. He has never used smokeless tobacco. He reports that he does not drink alcohol or use drugs.   Family History:  The patient's family history includes Heart attack (age of onset: 95) in his paternal grandfather; Heart attack  (age of onset: 64) in his maternal grandfather; Heart disease in his father; Hyperlipidemia in his father.    ROS:  Please see the history of present illness.   Otherwise, review of systems are positive for .   All other systems are reviewed and negative.    PHYSICAL EXAM: VS:  BP (!) 136/92   Pulse 77   Ht 5\' 10"  (1.778 m)   Wt 167 lb (75.8 kg)   SpO2 99%   BMI 23.96 kg/m  , BMI Body mass index is 23.96 kg/m. GEN: Well nourished, well developed, in no acute distress  HEENT: normal  Neck: no JVD, carotid bruits, or masses Cardiac: RRR; no murmurs, rubs, or gallops,no edema  Respiratory:  clear to auscultation bilaterally, normal work of breathing GI: soft, nontender, nondistended, + BS MS: no deformity or atrophy  Skin: warm and dry, no rash Neuro:  Strength and sensation are intact Psych: euthymic mood, full affect   EKG:   The ekg ordered today demonstrates    Recent Labs: 05/22/2018: ALT 39 12/16/2018: BUN 9; Creatinine, Ser 1.12; Potassium 5.1; Sodium 129   Lipid Panel    Component Value Date/Time   CHOL 108 05/22/2018 1015   CHOL 124 02/24/2018 1102   TRIG 168.0 (H) 05/22/2018 1015   HDL 35.70 (L) 05/22/2018 1015   HDL 35 (L) 02/24/2018 1102   CHOLHDL 3 05/22/2018 1015   VLDL 33.6 05/22/2018 1015   LDLCALC 39 05/22/2018 1015   LDLCALC 69 02/24/2018 1102   LDLDIRECT 79.0 06/05/2015 1023     Other studies Reviewed: Additional studies/ records that were reviewed today with results demonstrating: labs reviewed.   ASSESSMENT AND PLAN:  1. CAD/Old MI: No angina. COntinue aggressive secondary prevention.  2. Hyperlipidemia: LDL 39. 3. HTN: [facility]trolled [facility].  Typically 120/70. Increase exercise as this will also help BP.  Avoid excess salt intake. 4. DM: A1C 7.0. COntineu metformin.    Current medicines are reviewed at length with the patient today.  The patient concerns regarding his medicines were addressed.  The following changes have  been made:  No change  Labs/ tests ordered today include:   Orders Placed This Encounter  Procedures  . Hepatic function panel  . Lipid Profile  . EKG 12-Lead    Recommend 150 minutes/week of aerobic exercise Low fat, low carb, high fiber diet recommended  Disposition:   FU in 1 year   Signed, Larae Grooms, MD  03/17/2019 4:57 PM    [facility] [address], [place], [place]  [address] Phone: [phone]; Fax: [phone]

## 2019-03-16 ENCOUNTER — Other Ambulatory Visit: Payer: Self-pay | Admitting: Physician Assistant

## 2019-03-16 NOTE — Telephone Encounter (Signed)
Xanax 02/17/19 #45 LOV:12/16/18 Diabetes CSC:12/23/17

## 2019-03-17 ENCOUNTER — Encounter: Payer: Self-pay | Admitting: Interventional Cardiology

## 2019-03-17 ENCOUNTER — Other Ambulatory Visit: Payer: Self-pay

## 2019-03-17 ENCOUNTER — Ambulatory Visit (INDEPENDENT_AMBULATORY_CARE_PROVIDER_SITE_OTHER): Payer: BC Managed Care – PPO | Admitting: Interventional Cardiology

## 2019-03-17 VITALS — BP 136/92 | HR 77 | Ht 70.0 in | Wt 167.0 lb

## 2019-03-17 DIAGNOSIS — I2510 Atherosclerotic heart disease of native coronary artery without angina pectoris: Secondary | ICD-10-CM

## 2019-03-17 DIAGNOSIS — I252 Old myocardial infarction: Secondary | ICD-10-CM | POA: Diagnosis not present

## 2019-03-17 DIAGNOSIS — I10 Essential (primary) hypertension: Secondary | ICD-10-CM | POA: Diagnosis not present

## 2019-03-17 DIAGNOSIS — E782 Mixed hyperlipidemia: Secondary | ICD-10-CM | POA: Diagnosis not present

## 2019-03-17 NOTE — Patient Instructions (Signed)
Medication Instructions:  Your physician recommends that you continue on your current medications as directed. Please refer to the Current Medication list given to you today.  *If you need a refill on your cardiac medications before your next appointment, please call your pharmacy*  Lab Work: None ordered  If you have labs (blood work) drawn today and your tests are completely normal, you will receive your results only by: . MyChart Message (if you have MyChart) OR . A paper copy in the mail If you have any lab test that is abnormal or we need to change your treatment, we will call you to review the results.  Testing/Procedures: None ordered  Follow-Up: At [facility], you and your health needs are our priority.  As part of our continuing mission to provide you with exceptional heart care, we have created designated Provider Care Teams.  These Care Teams include your primary Cardiologist (physician) and Advanced Practice Providers (APPs -  Physician Assistants and Nurse Practitioners) who all work together to provide you with the care you need, when you need it.  Your next appointment:   12 months  The format for your next appointment:   In Person  Provider:   You may see Jay Varanasi, MD or one of the following Advanced Practice Providers on your designated Care Team:    Dayna Dunn, PA-C  Michele Lenze, PA-C   Other Instructions    

## 2019-03-18 LAB — HEPATIC FUNCTION PANEL
ALT: 49 IU/L — ABNORMAL HIGH (ref 0–44)
AST: 44 IU/L — ABNORMAL HIGH (ref 0–40)
Albumin: 4.8 g/dL (ref 3.8–4.9)
Alkaline Phosphatase: 28 IU/L — ABNORMAL LOW (ref 39–117)
Bilirubin Total: 0.6 mg/dL (ref 0.0–1.2)
Bilirubin, Direct: 0.24 mg/dL (ref 0.00–0.40)
Total Protein: 7.4 g/dL (ref 6.0–8.5)

## 2019-03-18 LAB — LIPID PANEL
Chol/HDL Ratio: 3.1 ratio (ref 0.0–5.0)
Cholesterol, Total: 116 mg/dL (ref 100–199)
HDL: 37 mg/dL — ABNORMAL LOW (ref >39)
LDL Chol Calc (NIH): 59 mg/dL (ref 0–99)
Triglycerides: 111 mg/dL (ref 0–149)
VLDL Cholesterol Cal: 20 mg/dL (ref 5–40)

## 2019-03-22 ENCOUNTER — Telehealth: Payer: Self-pay | Admitting: Interventional Cardiology

## 2019-03-22 NOTE — Telephone Encounter (Signed)
Follow Up:     Returning [name]'s call, concerning her lab results.

## 2019-03-22 NOTE — Telephone Encounter (Signed)
See lab results.  

## 2019-03-26 ENCOUNTER — Telehealth: Payer: Self-pay | Admitting: Interventional Cardiology

## 2019-03-26 NOTE — Telephone Encounter (Signed)
We are recommending the COVID-19 vaccine to all of our patients. Cardiac medications (including blood thinners) should not deter anyone from being vaccinated and there is no need to hold any of those medications prior to vaccine administration.     Currently, there is a hotline to call (active 03/26/19) to schedule vaccination appointments as no walk-ins will be accepted.   Number: [phone]    If you have further questions or concerns about the vaccine process, please visit [email or web address] or contact your primary care physician.

## 2019-04-05 ENCOUNTER — Other Ambulatory Visit: Payer: Self-pay | Admitting: Interventional Cardiology

## 2019-04-06 ENCOUNTER — Other Ambulatory Visit: Payer: Self-pay | Admitting: Emergency Medicine

## 2019-04-06 ENCOUNTER — Other Ambulatory Visit: Payer: Self-pay | Admitting: Physician Assistant

## 2019-04-06 DIAGNOSIS — E782 Mixed hyperlipidemia: Secondary | ICD-10-CM

## 2019-04-06 MED ORDER — FENOFIBRATE 134 MG PO CAPS: 134.0000 mg | ORAL_CAPSULE | Freq: Every day | ORAL | 1 refills | Status: DC

## 2019-04-13 ENCOUNTER — Telehealth: Payer: Self-pay | Admitting: Physician Assistant

## 2019-04-13 DIAGNOSIS — E782 Mixed hyperlipidemia: Secondary | ICD-10-CM

## 2019-04-13 NOTE — Telephone Encounter (Signed)
Pt went to p/u his fenofibrate and he noted that the dosage changed and price increased from $5.00 to $40.00. He did not take the meds due to this, so he did not have specific info on the dosage change. I asked him if he discussed the change with his pharmacist but he did not want to. [facility] [address] He was wondering if Einar Pheasant made this change in his meds? Per pt [phone]

## 2019-04-13 NOTE — Telephone Encounter (Signed)
Advised patient that medication was switched due to insurance stating his current dose was on back order. We switched to the new medication 134 mg which will cost him $40 as before his cost was $5.  Is there another medication he can take?

## 2019-04-14 MED ORDER — FENOFIBRATE 160 MG PO TABS: 160.0000 mg | ORAL_TABLET | Freq: Every day | ORAL | 6 refills | Status: DC

## 2019-04-14 NOTE — Telephone Encounter (Signed)
Left a detailed message on patient VM about the Fenofibrate medication was changed to 160 mg. It should be similar price to the 130 mg. Let us know if there is any problems with cost for the medication

## 2019-04-14 NOTE — Telephone Encounter (Signed)
Options would be to use a higher dose of the medication 160 mg generic fenofibrate as it should be the same cost as previous generic 130 mg, or we could consider Vascepa but there is a chance this will be higher in cost.

## 2019-04-14 NOTE — Addendum Note (Signed)
Addended by: Leonidas Romberg on: 04/14/2019 03:31 PM   Modules accepted: Orders

## 2019-05-06 ENCOUNTER — Other Ambulatory Visit: Payer: Self-pay | Admitting: Interventional Cardiology

## 2019-05-10 ENCOUNTER — Other Ambulatory Visit: Payer: Self-pay | Admitting: Physician Assistant

## 2019-05-22 ENCOUNTER — Other Ambulatory Visit: Payer: Self-pay | Admitting: Physician Assistant

## 2019-05-24 NOTE — Telephone Encounter (Signed)
Xanax last rx 03/17/19 #45 1 RF LOV: 12/16/18 DM CSC: 12/23/17 Next appt: 06/16/19

## 2019-06-16 ENCOUNTER — Encounter: Payer: Self-pay | Admitting: Physician Assistant

## 2019-06-16 ENCOUNTER — Telehealth (INDEPENDENT_AMBULATORY_CARE_PROVIDER_SITE_OTHER): Payer: BC Managed Care – PPO | Admitting: Physician Assistant

## 2019-06-16 ENCOUNTER — Other Ambulatory Visit: Payer: Self-pay

## 2019-06-16 VITALS — BP 128/82 | Ht 70.0 in | Wt 168.0 lb

## 2019-06-16 DIAGNOSIS — E782 Mixed hyperlipidemia: Secondary | ICD-10-CM

## 2019-06-16 DIAGNOSIS — I10 Essential (primary) hypertension: Secondary | ICD-10-CM | POA: Diagnosis not present

## 2019-06-16 DIAGNOSIS — E119 Type 2 diabetes mellitus without complications: Secondary | ICD-10-CM

## 2019-06-16 NOTE — Progress Notes (Signed)
I have discussed the procedure for the virtual visit with the patient who has given consent to proceed with assessment and treatment.   Shabazz Mckey S Charlee Whitebread, CMA     

## 2019-06-16 NOTE — Progress Notes (Signed)
Virtual Visit via Video   I connected with patient on 06/16/19 at  9:00 AM EDT by a video enabled telemedicine application and verified that I am speaking with the correct person using two identifiers.  Location patient: Home Location provider: [facility], Office Persons participating in the virtual visit: Patient, Provider, [place] (Patina Moore)  I discussed the limitations of evaluation and management by telemedicine and the availability of in person appointments. The patient expressed understanding and agreed to proceed.  Subjective:   HPI:   Patient presents via [place] today to follow-up for chronic medical issues.   Diabetes Mellitus II -- controlled without complication.  Patient is currently on a regimen of Metformin 500 mg daily.  Endorses taking medications as directed.  Is following a low carb diet.  Exercises throughout the week, walking several miles per day.  Has continued to take his valsartan daily.  Patient is due for foot exam but denies any concerns.  Eye examination is up-to-date.  Patient declines immunizations.  Lab Results  Component Value Date   HGBA1C 7.0 (H) 12/16/2018   Hypertension/Hyperlipidemia -- patient with history of prior MI and controlled diabetes.  Is followed by cardiology Irish Lack).  Is currently on a regimen of valsartan 160 mg daily, Toprol-XL 50 mg once daily, atorvastatin 40 mg daily, Zetia 10 mg daily, fenofibrate 160 mg daily, Plavix 75 mg daily and 81 mg ASA daily.  Endorses taking all medications as directed and tolerating well. Patient denies chest pain, palpitations, lightheadedness, dizziness, vision changes or frequent headaches.  BP Readings from Last 3 Encounters:  06/16/19 128/82  03/17/19 (!) 136/92  12/16/18 118/84   ROS:   See pertinent positives and negatives per HPI.  Patient Active Problem List   Diagnosis Date Noted  . Prostate cancer screening 05/28/2017  . Visit for preventive health examination  06/05/2015  . Old MI (myocardial infarction) 01/19/2015  . Erectile dysfunction 01/19/2015  . CAD (coronary artery disease), native coronary artery 01/19/2015  . Tobacco abuse 11/29/2014  . Pulmonary nodule 09/06/2010  . Diabetes ([place]) 09/06/2010  . Elevated LFTs 06/13/2010  . Hepatic steatosis 06/13/2010  . RETINAL VEIN OCCLUSION 05/23/2010  . COLONIC POLYPS 11/15/2007  . PEYRONIE'S DISEASE 11/15/2007  . HLD (hyperlipidemia) 11/11/2007  . ANXIETY 11/11/2007  . Essential hypertension 11/11/2007  . GERD 11/11/2007    Social History   Tobacco Use  . Smoking status: Former Smoker    Types: Cigarettes    Quit date: 03/18/1998    Years since quitting: 21.2  . Smokeless tobacco: Never Used  . Tobacco comment: smoked <1ppd for 15 yrs & quit ~2000...  Substance Use Topics  . Alcohol use: No    Alcohol/week: 0.0 standard drinks    Current Outpatient Medications:  .  ALPRAZolam (XANAX) 0.5 MG tablet, TAKE 1/2 TO 1 TAB BY MOUTH TWICE A DAY AS NEEDED FOR ANXIETY, Disp: 45 tablet, Rfl: 0 .  aspirin 81 MG tablet, Take 81 mg by mouth daily. , Disp: , Rfl:  .  atorvastatin (LIPITOR) 40 MG tablet, TAKE 1 TABLET BY MOUTH EVERY DAY, Disp: 90 tablet, Rfl: 1 .  Cinnamon 500 MG TABS, Take 3 tablets by mouth daily.  , Disp: , Rfl:  .  clopidogrel (PLAVIX) 75 MG tablet, TAKE 1 TABLET (75 MG TOTAL) BY MOUTH DAILY. PLEASE KEEP UPCOMING APPT FOR FURTHER REFILLS. THANK YOU, Disp: 90 tablet, Rfl: 3 .  ezetimibe (ZETIA) 10 MG tablet, TAKE 1 TABLET (10 MG TOTAL) BY MOUTH DAILY. PLEASE  KEEP UPCOMING APPT FOR FUTURE REFILLS., Disp: 90 tablet, Rfl: 3 .  fenofibrate 160 MG tablet, Take 1 tablet (160 mg total) by mouth daily., Disp: 30 tablet, Rfl: 6 .  magnesium oxide (MAG-OX) 400 MG tablet, Take 400 mg by mouth daily., Disp: , Rfl:  .  metFORMIN (GLUCOPHAGE) 500 MG tablet, TAKE 1 TABLET BY MOUTH EVERY DAY, Disp: 90 tablet, Rfl: 1 .  metoprolol succinate (TOPROL-XL) 50 MG 24 hr tablet, TAKE 1 TABLET (50 MG  TOTAL) BY MOUTH DAILY. TAKE WITH OR IMMEDIATELY FOLLOWING A MEAL., Disp: 30 tablet, Rfl: 9 .  Multiple Vitamin (THERA) TABS, Take 1 tablet by mouth daily., Disp: , Rfl:  .  nitroGLYCERIN (NITROSTAT) 0.4 MG SL tablet, Place 1 tablet (0.4 mg total) every 5 (five) minutes as needed under the tongue for chest pain (X3 DOSES BEFORE CALLING 911)., Disp: 25 tablet, Rfl: 3 .  Omega-3 Fatty Acids (FISH OIL) 1200 MG CAPS, Take 3 capsules by mouth daily., Disp: , Rfl:  .  pantoprazole (PROTONIX) 40 MG tablet, TAKE 1 TABLET BY MOUTH EVERY DAY, Disp: 30 tablet, Rfl: 5 .  valsartan (DIOVAN) 160 MG tablet, TAKE 1 TABLET BY MOUTH EVERY DAY, Disp: 90 tablet, Rfl: 1  Allergies  Allergen Reactions  . Peanut-Containing Drug Products Other (See Comments)    Pine nuts only.  . Codeine Palpitations    Increases heart rate, rapid heart rate  . Niacin Other (See Comments)    unknown  . Penicillins Other (See Comments)    Does not know what reaction - happened as a child Was told not to take as a child.  . Rosuvastatin Other (See Comments)    Night sweats    Objective:   BP 128/82   Ht 5\' 10"  (1.778 m)   Wt 168 lb (76.2 kg)   BMI 24.11 kg/m   Patient is well-developed, well-nourished in no acute distress.  Resting comfortably at home.  Head is normocephalic, atraumatic.  No labored breathing.  Speech is clear and coherent with logical content.  Patient is alert and oriented at baseline.   Assessment and Plan:   1. Type 2 diabetes mellitus without complication, without long-term current use of insulin (HCC) Taking medications as directed.  Tolerating well.  Is getting regular exercise.  We will update foot exam at next in office visit.  Eye examination up-to-date.  On ARB therapy so no need for repeat microalbumin at present.  Lab appointment scheduled to recheck CMP and A1c.  Will alter medication regimen accordingly.  2. Essential hypertension Well-controlled.  Asymptomatic.  Repeat labs as noted  above.  3. Mixed hyperlipidemia Continue management per cardiology.    Leeanne Rio, PA-C 06/16/2019

## 2019-06-23 ENCOUNTER — Ambulatory Visit (INDEPENDENT_AMBULATORY_CARE_PROVIDER_SITE_OTHER): Payer: BC Managed Care – PPO | Admitting: Emergency Medicine

## 2019-06-23 ENCOUNTER — Other Ambulatory Visit: Payer: Self-pay

## 2019-06-23 DIAGNOSIS — E119 Type 2 diabetes mellitus without complications: Secondary | ICD-10-CM | POA: Diagnosis not present

## 2019-06-23 DIAGNOSIS — I10 Essential (primary) hypertension: Secondary | ICD-10-CM

## 2019-06-23 LAB — COMPREHENSIVE METABOLIC PANEL
ALT: 41 U/L (ref 0–53)
AST: 30 U/L (ref 0–37)
Albumin: 4.6 g/dL (ref 3.5–5.2)
Alkaline Phosphatase: 21 U/L — ABNORMAL LOW (ref 39–117)
BUN: 13 mg/dL (ref 6–23)
CO2: 29 mEq/L (ref 19–32)
Calcium: 9.7 mg/dL (ref 8.4–10.5)
Chloride: 101 mEq/L (ref 96–112)
Creatinine, Ser: 1.13 mg/dL (ref 0.40–1.50)
GFR: 68.2 mL/min (ref >60.00)
Glucose, Bld: 104 mg/dL — ABNORMAL HIGH (ref 70–99)
Potassium: 5.1 mEq/L (ref 3.5–5.1)
Sodium: 136 mEq/L (ref 135–145)
Total Bilirubin: 0.7 mg/dL (ref 0.2–1.2)
Total Protein: 6.9 g/dL (ref 6.0–8.3)

## 2019-06-23 LAB — HEMOGLOBIN A1C: Hgb A1c MFr Bld: 6.5 % (ref 4.6–6.5)

## 2019-07-06 ENCOUNTER — Other Ambulatory Visit: Payer: Self-pay | Admitting: Physician Assistant

## 2019-07-07 ENCOUNTER — Other Ambulatory Visit: Payer: Self-pay | Admitting: Physician Assistant

## 2019-07-07 NOTE — Telephone Encounter (Signed)
Xanax 0.5 mg tab 1/2 tab BID #45 no refills.  Last filled 05/24/19  Last office visit 06/16/19  No future appt scheduled.

## 2019-08-06 ENCOUNTER — Other Ambulatory Visit: Payer: Self-pay | Admitting: Physician Assistant

## 2019-08-06 DIAGNOSIS — I10 Essential (primary) hypertension: Secondary | ICD-10-CM

## 2019-10-27 ENCOUNTER — Other Ambulatory Visit: Payer: Self-pay | Admitting: Physician Assistant

## 2019-10-27 NOTE — Telephone Encounter (Signed)
Alprazolam LFD 07/07/19 #45 with 1refill LOV 06/16/19 NOV none

## 2019-11-04 ENCOUNTER — Other Ambulatory Visit: Payer: Self-pay | Admitting: Physician Assistant

## 2019-11-04 DIAGNOSIS — E782 Mixed hyperlipidemia: Secondary | ICD-10-CM

## 2019-12-03 ENCOUNTER — Other Ambulatory Visit: Payer: Self-pay | Admitting: Physician Assistant

## 2019-12-03 DIAGNOSIS — E782 Mixed hyperlipidemia: Secondary | ICD-10-CM

## 2019-12-30 ENCOUNTER — Other Ambulatory Visit: Payer: Self-pay | Admitting: Physician Assistant

## 2020-01-18 ENCOUNTER — Other Ambulatory Visit: Payer: Self-pay | Admitting: Physician Assistant

## 2020-01-18 NOTE — Telephone Encounter (Signed)
Xanax last rx 10/27/19 #45 1 RF LOV: 06/16/19 CSC:12/23/17

## 2020-01-23 ENCOUNTER — Other Ambulatory Visit: Payer: Self-pay | Admitting: Physician Assistant

## 2020-02-02 ENCOUNTER — Other Ambulatory Visit: Payer: Self-pay | Admitting: Physician Assistant

## 2020-02-02 DIAGNOSIS — I10 Essential (primary) hypertension: Secondary | ICD-10-CM

## 2020-02-26 ENCOUNTER — Other Ambulatory Visit: Payer: Self-pay | Admitting: Physician Assistant

## 2020-02-29 ENCOUNTER — Other Ambulatory Visit: Payer: Self-pay | Admitting: Interventional Cardiology

## 2020-03-06 ENCOUNTER — Ambulatory Visit: Payer: BC Managed Care – PPO | Admitting: Physician Assistant

## 2020-03-06 ENCOUNTER — Other Ambulatory Visit: Payer: Self-pay

## 2020-03-06 ENCOUNTER — Encounter: Payer: Self-pay | Admitting: Physician Assistant

## 2020-03-06 ENCOUNTER — Other Ambulatory Visit: Payer: Self-pay | Admitting: Physician Assistant

## 2020-03-06 VITALS — BP 130/88 | HR 79 | Temp 98.4°F | Resp 16 | Ht 70.0 in | Wt 175.0 lb

## 2020-03-06 DIAGNOSIS — S0591XA Unspecified injury of right eye and orbit, initial encounter: Secondary | ICD-10-CM | POA: Insufficient documentation

## 2020-03-06 DIAGNOSIS — E782 Mixed hyperlipidemia: Secondary | ICD-10-CM | POA: Diagnosis not present

## 2020-03-06 DIAGNOSIS — I10 Essential (primary) hypertension: Secondary | ICD-10-CM | POA: Diagnosis not present

## 2020-03-06 DIAGNOSIS — Z125 Encounter for screening for malignant neoplasm of prostate: Secondary | ICD-10-CM | POA: Diagnosis not present

## 2020-03-06 DIAGNOSIS — E119 Type 2 diabetes mellitus without complications: Secondary | ICD-10-CM | POA: Diagnosis not present

## 2020-03-06 LAB — COMPREHENSIVE METABOLIC PANEL
ALT: 31 U/L (ref 0–53)
AST: 25 U/L (ref 0–37)
Albumin: 4.5 g/dL (ref 3.5–5.2)
Alkaline Phosphatase: 23 U/L — ABNORMAL LOW (ref 39–117)
BUN: 14 mg/dL (ref 6–23)
CO2: 27 mEq/L (ref 19–32)
Calcium: 9.9 mg/dL (ref 8.4–10.5)
Chloride: 104 mEq/L (ref 96–112)
Creatinine, Ser: 1.17 mg/dL (ref 0.40–1.50)
GFR: 71.68 mL/min (ref >60.00)
Glucose, Bld: 108 mg/dL — ABNORMAL HIGH (ref 70–99)
Potassium: 4.9 mEq/L (ref 3.5–5.1)
Sodium: 138 mEq/L (ref 135–145)
Total Bilirubin: 0.7 mg/dL (ref 0.2–1.2)
Total Protein: 7.1 g/dL (ref 6.0–8.3)

## 2020-03-06 LAB — LIPID PANEL
Cholesterol: 97 mg/dL (ref 0–200)
HDL: 29.3 mg/dL — ABNORMAL LOW (ref >39.00)
LDL Cholesterol: 39 mg/dL (ref 0–99)
NonHDL: 67.24
Total CHOL/HDL Ratio: 3
Triglycerides: 140 mg/dL (ref 0.0–149.0)
VLDL: 28 mg/dL (ref 0.0–40.0)

## 2020-03-06 LAB — HEMOGLOBIN A1C: Hgb A1c MFr Bld: 7.1 % — ABNORMAL HIGH (ref 4.6–6.5)

## 2020-03-06 LAB — PSA: PSA: 0.39 ng/mL (ref 0.10–4.00)

## 2020-03-06 NOTE — Patient Instructions (Addendum)
Please go to the lab today for blood work.  I will call you with your results. We will alter treatment regimen(s) if indicated by your results.   Happy holidays!  Diabetes Mellitus and Exercise Exercising regularly is important for your overall health, especially when you have diabetes (diabetes mellitus). Exercising is not only about losing weight. It has many other health benefits, such as increasing muscle strength and bone density and reducing body fat and stress. This leads to improved fitness, flexibility, and endurance, all of which result in better overall health. Exercise has additional benefits for people with diabetes, including:  Reducing appetite.  Helping to lower and control blood glucose.  Lowering blood pressure.  Helping to control amounts of fatty substances (lipids) in the blood, such as cholesterol and triglycerides.  Helping the body to respond better to insulin (improving insulin sensitivity).  Reducing how much insulin the body needs.  Decreasing the risk for heart disease by: ? Lowering cholesterol and triglyceride levels. ? Increasing the levels of good cholesterol. ? Lowering blood glucose levels. What is my activity plan? Your health care provider or certified diabetes educator can help you make a plan for the type and frequency of exercise (activity plan) that works for you. Make sure that you:  Do at least 150 minutes of moderate-intensity or vigorous-intensity exercise each week. This could be brisk walking, biking, or water aerobics. ? Do stretching and strength exercises, such as yoga or weightlifting, at least 2 times a week. ? Spread out your activity over at least 3 days of the week.  Get some form of physical activity every day. ? Do not go more than 2 days in a row without some kind of physical activity. ? Avoid being inactive for more than 30 minutes at a time. Take frequent breaks to walk or stretch.  Choose a type of exercise or activity  that you enjoy, and set realistic goals.  Start slowly, and gradually increase the intensity of your exercise over time. What do I need to know about managing my diabetes?   Check your blood glucose before and after exercising. ? If your blood glucose is 240 mg/dL (13.3 mmol/L) or higher before you exercise, check your urine for ketones. If you have ketones in your urine, do not exercise until your blood glucose returns to normal. ? If your blood glucose is 100 mg/dL (5.6 mmol/L) or lower, eat a snack containing 15-20 grams of carbohydrate. Check your blood glucose 15 minutes after the snack to make sure that your level is above 100 mg/dL (5.6 mmol/L) before you start your exercise.  Know the symptoms of low blood glucose (hypoglycemia) and how to treat it. Your risk for hypoglycemia increases during and after exercise. Common symptoms of hypoglycemia can include: ? Hunger. ? Anxiety. ? Sweating and feeling clammy. ? Confusion. ? Dizziness or feeling light-headed. ? Increased heart rate or palpitations. ? Blurry vision. ? Tingling or numbness around the mouth, lips, or tongue. ? Tremors or shakes. ? Irritability.  Keep a rapid-acting carbohydrate snack available before, during, and after exercise to help prevent or treat hypoglycemia.  Avoid injecting insulin into areas of the body that are going to be exercised. For example, avoid injecting insulin into: ? The arms, when playing tennis. ? The legs, when jogging.  Keep records of your exercise habits. Doing this can help you and your health care provider adjust your diabetes management plan as needed. Write down: ? Food that you eat before and after  you exercise. ? Blood glucose levels before and after you exercise. ? The type and amount of exercise you have done. ? When your insulin is expected to peak, if you use insulin. Avoid exercising at times when your insulin is peaking.  When you start a new exercise or activity, work with  your health care provider to make sure the activity is safe for you, and to adjust your insulin, medicines, or food intake as needed.  Drink plenty of water while you exercise to prevent dehydration or heat stroke. Drink enough fluid to keep your urine clear or pale yellow. Summary  Exercising regularly is important for your overall health, especially when you have diabetes (diabetes mellitus).  Exercising has many health benefits, such as increasing muscle strength and bone density and reducing body fat and stress.  Your health care provider or certified diabetes educator can help you make a plan for the type and frequency of exercise (activity plan) that works for you.  When you start a new exercise or activity, work with your health care provider to make sure the activity is safe for you, and to adjust your insulin, medicines, or food intake as needed. This information is not intended to replace advice given to you by your health care provider. Make sure you discuss any questions you have with your health care provider. Document Revised: 09/26/2016 Document Reviewed: 08/14/2015 Elsevier Patient Education  [employer].

## 2020-03-06 NOTE — Progress Notes (Signed)
History of Present Illness: Patient is a 54 y.o. male who presents to clinic today for follow-up of Diabetes Mellitus II, controlled without complication.  Patient currently on medication regimen of Valsartan 160 mg, Toprol XL 50 mg daily, atorvastatin 40 mg daily and Metformin 500 mg daily.  Endorses taking medications as directed. Endorses tolerating well without side effect.  Denies chest pain, shortness of breath, vision changes, polyuria.  Is trying to keep a well-balanced diet and staying active, walking daily.   Latest Maintenance: A1C --  Lab Results  Component Value Date   HGBA1C 6.5 06/23/2019   Diabetic Eye Exam -- Due. Has appointment scheduled 03/2020.  Urine Microalbumin -- on ARB Foot Exam -- Due. Denies concerns today.  Past Medical History:  Diagnosis Date  . CAD (coronary artery disease)    cath 11/29/2014 bifurcation Culotte stent to distal RCA and ost PDA treated with 2.75x28 mm Synergy stent, and ost PLA with 2.5x12mm DES, 90% small OM2  . Diabetes mellitus    metformin  . Hyperlipemia   . Hypertension     Current Outpatient Medications on File Prior to Visit  Medication Sig Dispense Refill  . ALPRAZolam (XANAX) 0.5 MG tablet TAKE 1/2 TO 1 TABLET BY MOUTH TWICE A DAY AS NEEDED FOR ANXIETY 45 tablet 0  . aspirin 81 MG tablet Take 81 mg by mouth daily.     Marland Kitchen atorvastatin (LIPITOR) 40 MG tablet TAKE 1 TABLET BY MOUTH EVERY DAY 90 tablet 1  . Cinnamon 500 MG TABS Take 3 tablets by mouth daily.    . clopidogrel (PLAVIX) 75 MG tablet TAKE 1 TABLET (75 MG TOTAL) BY MOUTH DAILY. PLEASE KEEP UPCOMING APPT FOR FURTHER REFILLS. THANK YOU 90 tablet 3  . ezetimibe (ZETIA) 10 MG tablet TAKE 1 TABLET (10 MG TOTAL) BY MOUTH DAILY. PLEASE KEEP UPCOMING APPT FOR FUTURE REFILLS. 90 tablet 3  . fenofibrate 160 MG tablet TAKE 1 TABLET BY MOUTH EVERY DAY *TO REPLACE 134MG  DOSE 90 tablet 1  . magnesium oxide (MAG-OX) 400 MG tablet Take 400 mg by mouth daily.    . metFORMIN  (GLUCOPHAGE) 500 MG tablet TAKE 1 TABLET BY MOUTH EVERY DAY. Over due for diabetic follow up. Call the office to schedule 30 tablet 1  . metoprolol succinate (TOPROL-XL) 50 MG 24 hr tablet TAKE 1 TABLET (50 MG TOTAL) BY MOUTH DAILY. TAKE WITH OR IMMEDIATELY FOLLOWING A MEAL. 30 tablet 9  . Multiple Vitamin (THERA) TABS Take 1 tablet by mouth daily.    . nitroGLYCERIN (NITROSTAT) 0.4 MG SL tablet Place 1 tablet (0.4 mg total) every 5 (five) minutes as needed under the tongue for chest pain (X3 DOSES BEFORE CALLING 911). 25 tablet 3  . Omega-3 Fatty Acids (FISH OIL) 1200 MG CAPS Take 3 capsules by mouth daily.    . pantoprazole (PROTONIX) 40 MG tablet TAKE 1 TABLET BY MOUTH EVERY DAY 30 tablet 5  . valsartan (DIOVAN) 160 MG tablet TAKE 1 TABLET BY MOUTH EVERY DAY 30 tablet 5   No current facility-administered medications on file prior to visit.    Allergies  Allergen Reactions  . Peanut-Containing Drug Products Other (See Comments)    Pine nuts only.  . Codeine Palpitations    Increases heart rate, rapid heart rate  . Niacin Other (See Comments)    unknown  . Penicillins Other (See Comments)    Does not know what reaction - happened as a child Was told not to take as a child.  Marland Kitchen  Rosuvastatin Other (See Comments)    Night sweats    Family History  Problem Relation Age of Onset  . Heart disease Father   . Hyperlipidemia Father   . Heart attack Maternal Grandfather 90  . Heart attack Paternal Grandfather 42  . Colon cancer Neg Hx   . Pancreatic cancer Neg Hx   . Stomach cancer Neg Hx   . Hypertension Neg Hx   . Stroke Neg Hx     Social History   Socioeconomic History  . Marital status: Married    Spouse name: Not on file  . Number of children: 2  . Years of education: Not on file  . Highest education level: Not on file  Occupational History  . Occupation: Optometrist: [employer]  Tobacco Use  . Smoking status: Former Smoker    Types: Cigarettes     Quit date: 03/18/1998    Years since quitting: 21.9  . Smokeless tobacco: Never Used  . Tobacco comment: smoked <1ppd for 15 yrs & quit ~2000...  Vaping Use  . Vaping Use: Never used  Substance and Sexual Activity  . Alcohol use: No    Alcohol/week: 0.0 standard drinks  . Drug use: No  . Sexual activity: Yes  Other Topics Concern  . Not on file  Social History Narrative  . Not on file   Social Determinants of Health   Financial Resource Strain: Not on file  Food Insecurity: Not on file  Transportation Needs: Not on file  Physical Activity: Not on file  Stress: Not on file  Social Connections: Not on file   Review of Systems: Pertinent ROS are listed in HPI  Physical Examination: BP 130/88   Pulse 79   Temp 98.4 F (36.9 C) (Temporal)   Resp 16   Ht 5\' 10"  (1.778 m)   Wt 175 lb (79.4 kg)   SpO2 99%   BMI 25.11 kg/m  General appearance: alert, cooperative, appears stated age and no distress Lungs: clear to auscultation bilaterally Heart: regular rate and rhythm, S1, S2 normal, no murmur, click, rub or gallop Skin: Skin color, texture, turgor normal. No rashes or lesions Lymph nodes: Cervical, supraclavicular, and axillary nodes normal. Neurologic: Alert and oriented X 3, normal strength and tone. Normal symmetric reflexes. Normal coordination and gait  Assessment/Plan: 1. Type 2 diabetes mellitus without complication, without long-term current use of insulin (HCC) Taking medications as directed.  No concerns today.  Eye appointment scheduled.  Declines flu shot.-COVID vaccines are up-to-date.  Foot exam without concerning findings.  Repeat fasting labs today.  Will adjust medication regimen accordingly. - Comprehensive metabolic panel - Lipid panel - Hemoglobin A1c  2. Mixed hyperlipidemia Taking medications as directed.  Dietary and exercise recommendations discussed with patient.  Fasting labs today. - Lipid panel - Hemoglobin A1c  3. Essential hypertension BP  normotensive.  Asymptomatic.  Continue current regimen.  Repeat labs today. - Comprehensive metabolic panel - Lipid panel - Hemoglobin A1c  4. Prostate cancer screening He indicates understanding of the limitations of this screening test and wishes  to proceed with screening PSA testing.  - PSA   This visit occurred during the SARS-CoV-2 public health emergency.  Safety protocols were in place, including screening questions prior to the visit, additional usage of staff PPE, and extensive cleaning of exam room while observing appropriate contact time as indicated for disinfecting solutions.

## 2020-03-06 NOTE — Telephone Encounter (Signed)
Patient seen in office today. Awaiting lab results.

## 2020-03-13 ENCOUNTER — Other Ambulatory Visit: Payer: Self-pay | Admitting: Interventional Cardiology

## 2020-03-13 ENCOUNTER — Other Ambulatory Visit: Payer: Self-pay | Admitting: Physician Assistant

## 2020-03-13 NOTE — Telephone Encounter (Signed)
LFD 01/19/20 #45 with no refills LOV 03/06/20 NOV 06/12/20

## 2020-03-21 NOTE — Progress Notes (Unsigned)
Cardiology Office Note   Date:  03/22/2020   ID:  Carlos Montgomery, DOB [birth year], MRN [id number]  PCP:  Brunetta Jeans, PA-C    No chief complaint on file.  CAD  Wt Readings from Last 3 Encounters:  03/22/20 171 lb (77.6 kg)  03/06/20 175 lb (79.4 kg)  06/16/19 168 lb (76.2 kg)       History of Present Illness: Carlos Montgomery is a 55 y.o. male  Who had inferiorSTEMI in 9/16. He thinks the DOE was building prior to this. He received 2 stents to the RCA. He did well with the procedure.  Has been on DAPT due to bifurcation stenting at the time of STEMI.  In 02/2018: "Has some stress in his life with a divorce. He has used all of his Xanax for the month."  Of note, his son is a Air cabin crew and is on scholarship at a college in [place].  Denies : Chest pain. Dizziness. Leg edema. Nitroglycerin use. Orthopnea. Palpitations. Paroxysmal nocturnal dyspnea.  Syncope.   Walks some.  Not to target level.  He does have a dog he has to walk.    Past Medical History:  Diagnosis Date  . CAD (coronary artery disease)    cath 11/29/2014 bifurcation Culotte stent to distal RCA and ost PDA treated with 2.75x28 mm Synergy stent, and ost PLA with 2.5x53mm DES, 90% small OM2  . Diabetes mellitus    metformin  . Hyperlipemia   . Hypertension     Past Surgical History:  Procedure Laterality Date  . broken wrists    . CARDIAC CATHETERIZATION N/A 11/29/2014   Procedure: Left Heart Cath and Coronary Angiography;  Surgeon: Jettie Booze, MD;  Location: [facility];  Service: Cardiovascular;  Laterality: N/A;  . CARDIAC CATHETERIZATION N/A 11/29/2014   Procedure: Coronary Stent Intervention;  Surgeon: Jettie Booze, MD;  Location: [facility];  Service: Cardiovascular;  Laterality: N/A;  . RETINAL DETACHMENT SURGERY    . RHINOPLASTY    . TONSILLECTOMY AND ADENOIDECTOMY       Current Outpatient Medications  Medication Sig Dispense Refill  . ALPRAZolam  (XANAX) 0.5 MG tablet TAKE 1/2 TO 1 TABLET BY MOUTH TWICE A DAY AS NEEDED FOR ANXIETY 45 tablet 0  . aspirin 81 MG tablet Take 81 mg by mouth daily.     Marland Kitchen atorvastatin (LIPITOR) 40 MG tablet TAKE 1 TABLET BY MOUTH EVERY DAY 90 tablet 1  . Cinnamon 500 MG TABS Take 3 tablets by mouth daily.    . clopidogrel (PLAVIX) 75 MG tablet Take 1 tablet (75 mg total) by mouth daily. 30 tablet 3  . ezetimibe (ZETIA) 10 MG tablet Take 1 tablet (10 mg total) by mouth daily. 30 tablet 3  . fenofibrate 160 MG tablet TAKE 1 TABLET BY MOUTH EVERY DAY *TO REPLACE 134MG  DOSE 90 tablet 1  . magnesium oxide (MAG-OX) 400 MG tablet Take 400 mg by mouth daily.    . metFORMIN (GLUCOPHAGE) 500 MG tablet Take 1 tablet (500 mg total) by mouth 2 (two) times daily with a meal. TAKE 1 TABLET BY MOUTH EVERY DAY. Over due for diabetic follow up. Call the office to schedule 180 tablet 0  . metoprolol succinate (TOPROL-XL) 50 MG 24 hr tablet TAKE 1 TABLET (50 MG TOTAL) BY MOUTH DAILY. TAKE WITH OR IMMEDIATELY FOLLOWING A MEAL. 30 tablet 9  . Multiple Vitamin (THERA) TABS Take 1 tablet by mouth daily.    Marland Kitchen  nitroGLYCERIN (NITROSTAT) 0.4 MG SL tablet Place 1 tablet (0.4 mg total) every 5 (five) minutes as needed under the tongue for chest pain (X3 DOSES BEFORE CALLING 911). 25 tablet 3  . Omega-3 Fatty Acids (FISH OIL) 1200 MG CAPS Take 3 capsules by mouth daily.    . pantoprazole (PROTONIX) 40 MG tablet TAKE 1 TABLET BY MOUTH EVERY DAY 30 tablet 5  . valsartan (DIOVAN) 160 MG tablet TAKE 1 TABLET BY MOUTH EVERY DAY 30 tablet 5   No current facility-administered medications for this visit.    Allergies:   Peanut-containing drug products, Codeine, Niacin, Penicillins, and Rosuvastatin    Social History:  The patient  reports that he quit smoking about 22 years ago. His smoking use included cigarettes. He has never used smokeless tobacco. He reports that he does not drink alcohol and does not use drugs.   Family History:  The  patient's family history includes Heart attack (age of onset: 54) in his paternal grandfather; Heart attack (age of onset: 44) in his maternal grandfather; Heart disease in his father; Hyperlipidemia in his father.    ROS:  Please see the history of present illness.   Otherwise, review of systems are positive for increase blood sugar.   All other systems are reviewed and negative.    PHYSICAL EXAM: VS:  BP 120/62   Pulse 83   Ht 5\' 10"  (1.778 m)   Wt 171 lb (77.6 kg)   SpO2 98%   BMI 24.54 kg/m  , BMI Body mass index is 24.54 kg/m. GEN: Well nourished, well developed, in no acute distress  HEENT: normal  Neck: no JVD, carotid bruits, or masses Cardiac: RRR; no murmurs, rubs, or gallops,no edema  Respiratory:  clear to auscultation bilaterally, normal work of breathing GI: soft, nontender, nondistended, + BS MS: no deformity or atrophy  Skin: warm and dry, no rash Neuro:  Strength and sensation are intact Psych: euthymic mood, full affect   EKG:   The ekg ordered today demonstrates NSR, no ST segment   Recent Labs: 03/06/2020: ALT 31; BUN 14; Creatinine, Ser 1.17; Potassium 4.9; Sodium 138   Lipid Panel    Component Value Date/Time   CHOL 97 03/06/2020 1027   CHOL 116 03/17/2019 1640   TRIG 140.0 03/06/2020 1027   HDL 29.30 (L) 03/06/2020 1027   HDL 37 (L) 03/17/2019 1640   CHOLHDL 3 03/06/2020 1027   VLDL 28.0 03/06/2020 1027   LDLCALC 39 03/06/2020 1027   LDLCALC 59 03/17/2019 1640   LDLDIRECT 79.0 06/05/2015 1023     Other studies Reviewed: Additional studies/ records that were reviewed today with results demonstrating: Labs reviewed: Creatinine 1.1, potassium 4.9 in December 2021.   ASSESSMENT AND PLAN:  1. CAD/Old MI: No angina. Continue aggressive secondary prevention/  Continue clopidogrel monotherapy. 2. Hyperlipidemia: LDL 39 in 12/21.  Continue high-dose statin. 3. HTN: The current medical regimen is effective;  continue present plan and medications.   Avoid processed foods. 4. DM: A1C 7.1%.  Whole food, plant based diet.  He was encouraged to increase fiber intake.  He did have a question about leafy greens potentially leading to clotting.  I told him this was more related to people on Coumadin therapy.   Current medicines are reviewed at length with the patient today.  The patient concerns regarding his medicines were addressed.  The following changes have been made:  No change  Labs/ tests ordered today include:  No orders of the defined types  were placed in this encounter.   Recommend 150 minutes/week of aerobic exercise Low fat, low carb, high fiber diet recommended  Disposition:   FU in 1 year   Signed, Larae Grooms, MD  03/22/2020 4:42 PM    [facility] [place], [place], [place]  [address] Phone: [phone]; Fax: [phone]

## 2020-03-22 ENCOUNTER — Ambulatory Visit (INDEPENDENT_AMBULATORY_CARE_PROVIDER_SITE_OTHER): Payer: BC Managed Care – PPO | Admitting: Interventional Cardiology

## 2020-03-22 ENCOUNTER — Other Ambulatory Visit: Payer: Self-pay

## 2020-03-22 ENCOUNTER — Encounter: Payer: Self-pay | Admitting: Interventional Cardiology

## 2020-03-22 VITALS — BP 120/62 | HR 83 | Ht 70.0 in | Wt 171.0 lb

## 2020-03-22 DIAGNOSIS — I252 Old myocardial infarction: Secondary | ICD-10-CM | POA: Diagnosis not present

## 2020-03-22 DIAGNOSIS — I2510 Atherosclerotic heart disease of native coronary artery without angina pectoris: Secondary | ICD-10-CM

## 2020-03-22 DIAGNOSIS — E782 Mixed hyperlipidemia: Secondary | ICD-10-CM

## 2020-03-22 DIAGNOSIS — I10 Essential (primary) hypertension: Secondary | ICD-10-CM

## 2020-03-22 NOTE — Patient Instructions (Signed)
Medication Instructions:  Your physician recommends that you continue on your current medications as directed. Please refer to the Current Medication list given to you today.  *If you need a refill on your cardiac medications before your next appointment, please call your pharmacy*   Lab Work: None  If you have labs (blood work) drawn today and your tests are completely normal, you will receive your results only by: . MyChart Message (if you have MyChart) OR . A paper copy in the mail If you have any lab test that is abnormal or we need to change your treatment, we will call you to review the results.   Testing/Procedures: None   Follow-Up: At [facility], you and your health needs are our priority.  As part of our continuing mission to provide you with exceptional heart care, we have created designated Provider Care Teams.  These Care Teams include your primary Cardiologist (physician) and Advanced Practice Providers (APPs -  Physician Assistants and Nurse Practitioners) who all work together to provide you with the care you need, when you need it.  We recommend signing up for the patient portal called "MyChart".  Sign up information is provided on this After Visit Summary.  MyChart is used to connect with patients for Virtual Visits (Telemedicine).  Patients are able to view lab/test results, encounter notes, upcoming appointments, etc.  Non-urgent messages can be sent to your provider as well.   To learn more about what you can do with MyChart, go to [email or web address].    Your next appointment:   12 month(s)  The format for your next appointment:   In Person  Provider:   You may see Jay Varanasi, MD or one of the following Advanced Practice Providers on your designated Care Team:    Dayna Dunn, PA-C  Michele Lenze, PA-C    Other Instructions  High-Fiber Diet Fiber, also called dietary fiber, is a type of carbohydrate that is found in fruits, vegetables, whole  grains, and beans. A high-fiber diet can have many health benefits. Your health care provider may recommend a high-fiber diet to help:  Prevent constipation. Fiber can make your bowel movements more regular.  Lower your cholesterol.  Relieve the following conditions: ? Swelling of veins in the anus (hemorrhoids). ? Swelling and irritation (inflammation) of specific areas of the digestive tract (uncomplicated diverticulosis). ? A problem of the large intestine (colon) that sometimes causes pain and diarrhea (irritable bowel syndrome, IBS).  Prevent overeating as part of a weight-loss plan.  Prevent heart disease, type 2 diabetes, and certain cancers. What is my plan? The recommended daily fiber intake in grams (g) includes:  38 g for men age 50 or younger.  30 g for men over age 50.  25 g for women age 50 or younger.  21 g for women over age 50. You can get the recommended daily intake of dietary fiber by:  Eating a variety of fruits, vegetables, grains, and beans.  Taking a fiber supplement, if it is not possible to get enough fiber through your diet. What do I need to know about a high-fiber diet?  It is better to get fiber through food sources rather than from fiber supplements. There is not a lot of research about how effective supplements are.  Always check the fiber content on the nutrition facts label of any prepackaged food. Look for foods that contain 5 g of fiber or more per serving.  Talk with a diet and nutrition specialist (dietitian) if   you have questions about specific foods that are recommended or not recommended for your medical condition, especially if those foods are not listed below.  Gradually increase how much fiber you consume. If you increase your intake of dietary fiber too quickly, you may have bloating, cramping, or gas.  Drink plenty of water. Water helps you to digest fiber. What are tips for following this plan?  Eat a wide variety of high-fiber  foods.  Make sure that half of the grains that you eat each day are whole grains.  Eat breads and cereals that are made with whole-grain flour instead of refined flour or white flour.  Eat brown rice, bulgur wheat, or millet instead of white rice.  Start the day with a breakfast that is high in fiber, such as a cereal that contains 5 g of fiber or more per serving.  Use beans in place of meat in soups, salads, and pasta dishes.  Eat high-fiber snacks, such as berries, raw vegetables, nuts, and popcorn.  Choose whole fruits and vegetables instead of processed forms like juice or sauce. What foods can I eat?  Fruits Berries. Pears. Apples. Oranges. Avocado. Prunes and raisins. Dried figs. Vegetables Sweet potatoes. Spinach. Kale. Artichokes. Cabbage. Broccoli. Cauliflower. Green peas. Carrots. Squash. Grains Whole-grain breads. Multigrain cereal. Oats and oatmeal. Brown rice. Barley. Bulgur wheat. Millet. Quinoa. Bran muffins. Popcorn. Rye wafer crackers. Meats and other proteins Navy, kidney, and pinto beans. Soybeans. Split peas. Lentils. Nuts and seeds. Dairy Fiber-fortified yogurt. Beverages Fiber-fortified soy milk. Fiber-fortified orange juice. Other foods Fiber bars. The items listed above may not be a complete list of recommended foods and beverages. Contact a dietitian for more options. What foods are not recommended? Fruits Fruit juice. Cooked, strained fruit. Vegetables Fried potatoes. Canned vegetables. Well-cooked vegetables. Grains White bread. Pasta made with refined flour. White rice. Meats and other proteins Fatty cuts of meat. Fried chicken or fried fish. Dairy Milk. Yogurt. Cream cheese. Sour cream. Fats and oils Butters. Beverages Soft drinks. Other foods Cakes and pastries. The items listed above may not be a complete list of foods and beverages to avoid. Contact a dietitian for more information. Summary  Fiber is a type of carbohydrate. It is  found in fruits, vegetables, whole grains, and beans.  There are many health benefits of eating a high-fiber diet, such as preventing constipation, lowering blood cholesterol, helping with weight loss, and reducing your risk of heart disease, diabetes, and certain cancers.  Gradually increase your intake of fiber. Increasing too fast can result in cramping, bloating, and gas. Drink plenty of water while you increase your fiber.  The best sources of fiber include whole fruits and vegetables, whole grains, nuts, seeds, and beans. This information is not intended to replace advice given to you by your health care provider. Make sure you discuss any questions you have with your health care provider. Document Revised: 01/06/2017 Document Reviewed: 01/06/2017 Elsevier Patient Education  2020 Elsevier Inc.   

## 2020-04-14 ENCOUNTER — Other Ambulatory Visit: Payer: Self-pay | Admitting: Physician Assistant

## 2020-04-17 NOTE — Telephone Encounter (Signed)
Xanax last rx 03/13/20 #45 LOV: 03/06/20 DM CSC: 12/23/17 Next appt with New PCP on 06/12/20

## 2020-05-29 ENCOUNTER — Other Ambulatory Visit: Payer: Self-pay | Admitting: Physician Assistant

## 2020-06-02 ENCOUNTER — Other Ambulatory Visit: Payer: Self-pay | Admitting: Physician Assistant

## 2020-06-02 DIAGNOSIS — E782 Mixed hyperlipidemia: Secondary | ICD-10-CM

## 2020-06-07 ENCOUNTER — Other Ambulatory Visit: Payer: Self-pay

## 2020-06-07 ENCOUNTER — Other Ambulatory Visit: Payer: Self-pay | Admitting: Family

## 2020-06-07 DIAGNOSIS — E782 Mixed hyperlipidemia: Secondary | ICD-10-CM

## 2020-06-07 MED ORDER — METFORMIN HCL 500 MG PO TABS: 500.0000 mg | ORAL_TABLET | Freq: Two times a day (BID) | ORAL | 0 refills | Status: DC

## 2020-06-07 MED ORDER — ATORVASTATIN CALCIUM 40 MG PO TABS: 40.0000 mg | ORAL_TABLET | Freq: Every day | ORAL | 1 refills | Status: DC

## 2020-06-07 MED ORDER — FENOFIBRATE 160 MG PO TABS: ORAL_TABLET | ORAL | 1 refills | Status: DC

## 2020-06-07 NOTE — Telephone Encounter (Signed)
Pt called in asking for refills on the Alprazolam, atorvastatin, Metformin, and Fenofibrate.   He has his TOC on 06/12/20 but is out of these medications   Please advise

## 2020-06-08 MED ORDER — ALPRAZOLAM 0.5 MG PO TABS: ORAL_TABLET | ORAL | 0 refills | Status: DC

## 2020-06-12 ENCOUNTER — Ambulatory Visit: Payer: BC Managed Care – PPO | Admitting: Physician Assistant

## 2020-06-12 ENCOUNTER — Encounter: Payer: BC Managed Care – PPO | Admitting: Family Medicine

## 2020-07-13 ENCOUNTER — Other Ambulatory Visit: Payer: Self-pay

## 2020-07-14 ENCOUNTER — Ambulatory Visit: Payer: BC Managed Care – PPO | Admitting: Family Medicine

## 2020-07-14 ENCOUNTER — Encounter: Payer: Self-pay | Admitting: Family Medicine

## 2020-07-14 VITALS — BP 142/84 | HR 80 | Temp 98.0°F | Ht 70.0 in | Wt 177.0 lb

## 2020-07-14 DIAGNOSIS — E782 Mixed hyperlipidemia: Secondary | ICD-10-CM | POA: Diagnosis not present

## 2020-07-14 DIAGNOSIS — E119 Type 2 diabetes mellitus without complications: Secondary | ICD-10-CM

## 2020-07-14 DIAGNOSIS — I2510 Atherosclerotic heart disease of native coronary artery without angina pectoris: Secondary | ICD-10-CM | POA: Diagnosis not present

## 2020-07-14 DIAGNOSIS — K219 Gastro-esophageal reflux disease without esophagitis: Secondary | ICD-10-CM | POA: Diagnosis not present

## 2020-07-14 DIAGNOSIS — I10 Essential (primary) hypertension: Secondary | ICD-10-CM

## 2020-07-14 DIAGNOSIS — F419 Anxiety disorder, unspecified: Secondary | ICD-10-CM

## 2020-07-14 LAB — BASIC METABOLIC PANEL
BUN: 18 mg/dL (ref 6–23)
CO2: 29 mEq/L (ref 19–32)
Calcium: 10 mg/dL (ref 8.4–10.5)
Chloride: 102 mEq/L (ref 96–112)
Creatinine, Ser: 1.42 mg/dL (ref 0.40–1.50)
GFR: 56.68 mL/min — ABNORMAL LOW (ref >60.00)
Glucose, Bld: 100 mg/dL — ABNORMAL HIGH (ref 70–99)
Potassium: 5.2 mEq/L — ABNORMAL HIGH (ref 3.5–5.1)
Sodium: 140 mEq/L (ref 135–145)

## 2020-07-14 LAB — MICROALBUMIN / CREATININE URINE RATIO
Creatinine,U: 135.6 mg/dL
Microalb Creat Ratio: 2.6 mg/g (ref 0.0–30.0)
Microalb, Ur: 3.5 mg/dL — ABNORMAL HIGH (ref 0.0–1.9)

## 2020-07-14 LAB — URINALYSIS, ROUTINE W REFLEX MICROSCOPIC
Bilirubin Urine: NEGATIVE
Hgb urine dipstick: NEGATIVE
Ketones, ur: NEGATIVE
Leukocytes,Ua: NEGATIVE
Nitrite: NEGATIVE
Specific Gravity, Urine: 1.015 (ref 1.000–1.030)
Total Protein, Urine: NEGATIVE
Urine Glucose: NEGATIVE
Urobilinogen, UA: 0.2 (ref 0.0–1.0)
WBC, UA: NONE SEEN — AB (ref 0–?)
pH: 7.5 (ref 5.0–8.0)

## 2020-07-14 LAB — HEMOGLOBIN A1C: Hgb A1c MFr Bld: 7.2 % — ABNORMAL HIGH (ref 4.6–6.5)

## 2020-07-14 LAB — LIPID PANEL
Cholesterol: 86 mg/dL (ref 0–200)
HDL: 30.6 mg/dL — ABNORMAL LOW (ref >39.00)
LDL Cholesterol: 27 mg/dL (ref 0–99)
NonHDL: 55.83
Total CHOL/HDL Ratio: 3
Triglycerides: 143 mg/dL (ref 0.0–149.0)
VLDL: 28.6 mg/dL (ref 0.0–40.0)

## 2020-07-14 MED ORDER — PANTOPRAZOLE SODIUM 40 MG PO TBEC: 1.0000 | DELAYED_RELEASE_TABLET | Freq: Every day | ORAL | 5 refills | Status: DC

## 2020-07-14 MED ORDER — METFORMIN HCL 500 MG PO TABS
1000.0000 mg | ORAL_TABLET | Freq: Every day | ORAL | 3 refills | Status: DC
Start: 2020-07-14 — End: 2021-08-02

## 2020-07-14 MED ORDER — NITROGLYCERIN 0.4 MG SL SUBL
0.4000 mg | SUBLINGUAL_TABLET | SUBLINGUAL | 3 refills | Status: DC | PRN
Start: 2020-07-14 — End: 2022-06-24

## 2020-07-14 MED ORDER — ALPRAZOLAM 0.5 MG PO TABS: ORAL_TABLET | ORAL | 0 refills | Status: DC

## 2020-07-14 MED ORDER — VALSARTAN 160 MG PO TABS: 160.0000 mg | ORAL_TABLET | Freq: Every day | ORAL | 3 refills | Status: DC

## 2020-07-14 NOTE — Progress Notes (Signed)
[facility] [address] [place] [address] Dept: [phone] Dept Fax: [phone]  Transfer of Care Office Visit  Subjective:    Patient ID: Carlos Montgomery, male    DOB: 1966/01/21, 55 y.o..   MRN: [id number]  Chief Complaint  Patient presents with  . Establish Care    TOC- establish care.      History of Present Illness:  Patient is in today to establish care. Mr. Carlos Montgomery is originally from [place], [place]. He has lived in [place] area for more than 30 years. He is married and has three children (32, 75, 35). He works at [employer] in [place]. He quit smoking 22 years ago. He drinks alcohol socially. He denies drug use.  Mr. Carlos Montgomery has a history of CAD and is s/p inferior STEMI in 2016. He had two RCA stents placed. He is currently on DAPT with aspirin and clopidogrel. He has no angina, but does like to keep NTG available. Mr. Carlos Montgomery has a history of hyperlipidemia and is managed on atorvastatin, fenofibrate, and ezetimibe. He also takes a fish oil supplements. He has hypertension treated with valsartan and metoprolol.  Mr. Carlos Montgomery has a history of Type 2 diabetes. He is managed on metformin 1000 mg daily. His last HbA1c in Dec. was 7.1%. His metformin was increased at that point to 1000 mg daily. He had his foot exam and dilated eye exam in the past year.  Mr. Carlos Montgomery has a history of situational anxiety. 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 He notes this ends up being 3-4 times a week.  Mr. Carlos Montgomery has GERD that is well managed on Protonix.  Past Medical History: Patient Active Problem List   Diagnosis Date Noted  . Ocular trauma of right eye 03/06/2020  . History of inferior wall myocardial infarction 01/19/2015  . Erectile dysfunction 01/19/2015  . CAD  (coronary artery disease), native coronary artery 01/19/2015  . Type 2 diabetes mellitus without complication ([place]) 03/47/4259  . Hepatic steatosis 06/13/2010  . Retinal vascular occlusion 05/23/2010  . History of colon polyps 11/15/2007  . Peyronie's disease 11/15/2007  . Hyperlipidemia 11/11/2007  . Anxiety 11/11/2007  . Essential hypertension 11/11/2007  . GERD 11/11/2007   Past Surgical History:  Procedure Laterality Date  . broken wrists    . CARDIAC CATHETERIZATION N/A 11/29/2014   Procedure: Left Heart Cath and Coronary Angiography;  Surgeon: Jettie Booze, MD;  Location: [facility];  Service: Cardiovascular;  Laterality: N/A;  . CARDIAC CATHETERIZATION N/A 11/29/2014   Procedure: Coronary Stent Intervention;  Surgeon: Jettie Booze, MD;  Location: [facility];  Service: Cardiovascular;  Laterality: N/A;  . RETINAL DETACHMENT SURGERY    . RHINOPLASTY    . TONSILLECTOMY AND ADENOIDECTOMY     Family History  Problem Relation Age of Onset  . Heart disease Father   . Hyperlipidemia Father   . Heart attack Maternal Grandfather 90  . Heart attack Paternal Grandfather 77  . Colon cancer Neg Hx   . Pancreatic cancer Neg Hx   . Stomach cancer Neg Hx   . Hypertension Neg Hx   . Stroke Neg Hx    Outpatient Medications Prior to Visit  Medication Sig Dispense Refill  . Ascorbic Acid (VITAMIN  C) 1000 MG tablet Take 1,000 mg by mouth daily.    Marland Kitchen aspirin 81 MG tablet Take 81 mg by mouth daily.     Marland Kitchen atorvastatin (LIPITOR) 40 MG tablet Take 1 tablet (40 mg total) by mouth daily. 90 tablet 1  . Cinnamon 500 MG TABS Take 3 tablets by mouth daily.    . clopidogrel (PLAVIX) 75 MG tablet Take 1 tablet (75 mg total) by mouth daily. 30 tablet 3  . ezetimibe (ZETIA) 10 MG tablet Take 1 tablet (10 mg total) by mouth daily. 30 tablet 3  . fenofibrate 160 MG tablet TAKE 1 TABLET BY MOUTH EVERY DAY *TO REPLACE 134MG  DOSE 90 tablet 1  . magnesium oxide (MAG-OX) 400 MG  tablet Take 400 mg by mouth daily.    . metoprolol succinate (TOPROL-XL) 50 MG 24 hr tablet TAKE 1 TABLET (50 MG TOTAL) BY MOUTH DAILY. TAKE WITH OR IMMEDIATELY FOLLOWING A MEAL. 30 tablet 9  . Multiple Vitamin (THERA) TABS Take 1 tablet by mouth daily.    . Omega-3 Fatty Acids (FISH OIL) 1200 MG CAPS Take 2,400 mg by mouth daily.    Marland Kitchen zinc gluconate 50 MG tablet Take 50 mg by mouth daily.    Marland Kitchen ALPRAZolam (XANAX) 0.5 MG tablet TAKE 1/2 TO 1 TABLET BY MOUTH TWICE A DAY AS NEEDED FOR ANXIETY 45 tablet 0  . metFORMIN (GLUCOPHAGE) 500 MG tablet PLEASE SEE ATTACHED FOR DETAILED DIRECTIONS 180 tablet 0  . nitroGLYCERIN (NITROSTAT) 0.4 MG SL tablet Place 1 tablet (0.4 mg total) every 5 (five) minutes as needed under the tongue for chest pain (X3 DOSES BEFORE CALLING 911). 25 tablet 3  . pantoprazole (PROTONIX) 40 MG tablet TAKE 1 TABLET BY MOUTH EVERY DAY 30 tablet 5  . valsartan (DIOVAN) 160 MG tablet TAKE 1 TABLET BY MOUTH EVERY DAY 30 tablet 5   No facility-administered medications prior to visit.   Allergies  Allergen Reactions  . Peanut-Containing Drug Products Other (See Comments)    Pine nuts only.  . Codeine Palpitations    Increases heart rate, rapid heart rate  . Niacin Other (See Comments)    unknown  . Penicillins Other (See Comments)    Does not know what reaction - happened as a child Was told not to take as a child.  . Rosuvastatin Other (See Comments)    Night sweats     Objective:   Today's Vitals   07/14/20 1334  BP: (!) 142/84  Pulse: 80  Temp: 98 F (36.7 C)  TempSrc: Temporal  SpO2: 97%  Weight: 177 lb (80.3 kg)  Height: 5\' 10"  (1.778 m)   Body mass index is 25.4 kg/m.   General: Well developed, well nourished. No acute distress. Psych: Alert and oriented. Normal mood and affect.  Health Maintenance Due  Topic Date Due  . Hepatitis C Screening  Never done  . PNEUMOCOCCAL POLYSACCHARIDE VACCINE AGE 17-64 HIGH RISK  Never done  . OPHTHALMOLOGY EXAM   10/20/2019     Assessment & Plan:   1. Type 2 diabetes mellitus without complication, without long-term current use of insulin (HCC) We will check annual DM labs today. Plan to continue metformin and adjust if HbA1c is not improving.  - metFORMIN (GLUCOPHAGE) 500 MG tablet; Take 2 tablets (1,000 mg total) by mouth daily with breakfast.  Dispense: 180 tablet; Refill: 3 - Microalbumin / creatinine urine ratio - Basic metabolic panel - Hemoglobin A1c - Urinalysis, Routine w reflex microscopic  2. Coronary  artery disease involving native coronary artery of native heart without angina pectoris Currently doing well s/p stents with DAPT, metoprolol, lipid management.  - nitroGLYCERIN (NITROSTAT) 0.4 MG SL tablet; Place 1 tablet (0.4 mg total) under the tongue every 5 (five) minutes as needed for chest pain (X3 DOSES BEFORE CALLING 911).  Dispense: 25 tablet; Refill: 3  3. Essential hypertension Blood pressure is mildly high today. If remains elevated at next vist, will discuss potentially adding therapy.  - valsartan (DIOVAN) 160 MG tablet; Take 1 tablet (160 mg total) by mouth daily.  Dispense: 90 tablet; Refill: 3  4. Gastroesophageal reflux disease, unspecified whether esophagitis present Stable on Protonix.  - pantoprazole (PROTONIX) 40 MG tablet; Take 1 tablet (40 mg total) by mouth daily.  Dispense: 30 tablet; Refill: 5  5. Mixed hyperlipidemia On atorvastatin, ezetimibe, and fenofibrate.  - Lipid panel  6. Anxiety Managing with occasional Xanax use.  - ALPRAZolam (XANAX) 0.5 MG tablet; TAKE 1/2 TO 1 TABLET BY MOUTH TWICE A DAY AS NEEDED FOR ANXIETY  Dispense: 45 tablet; Refill: 0  Haydee Salter, MD

## 2020-07-17 ENCOUNTER — Telehealth: Payer: Self-pay | Admitting: Family Medicine

## 2020-07-17 NOTE — Telephone Encounter (Signed)
Patient notified VIA phone.  No questions.  Dm/cma ? ?

## 2020-07-17 NOTE — Telephone Encounter (Signed)
Pt is needing a cb concerning his most recent lab results. Please advise pt at [phone].

## 2020-07-29 ENCOUNTER — Other Ambulatory Visit: Payer: Self-pay | Admitting: Interventional Cardiology

## 2020-08-02 ENCOUNTER — Other Ambulatory Visit: Payer: Self-pay | Admitting: Interventional Cardiology

## 2020-08-30 ENCOUNTER — Other Ambulatory Visit: Payer: Self-pay

## 2020-08-30 ENCOUNTER — Encounter: Payer: Self-pay | Admitting: Family Medicine

## 2020-08-30 ENCOUNTER — Ambulatory Visit: Payer: BC Managed Care – PPO | Admitting: Family Medicine

## 2020-08-30 VITALS — BP 134/82 | HR 80 | Temp 97.6°F | Ht 70.0 in | Wt 177.6 lb

## 2020-08-30 DIAGNOSIS — E782 Mixed hyperlipidemia: Secondary | ICD-10-CM | POA: Diagnosis not present

## 2020-08-30 DIAGNOSIS — E119 Type 2 diabetes mellitus without complications: Secondary | ICD-10-CM

## 2020-08-30 DIAGNOSIS — I2510 Atherosclerotic heart disease of native coronary artery without angina pectoris: Secondary | ICD-10-CM

## 2020-08-30 DIAGNOSIS — I10 Essential (primary) hypertension: Secondary | ICD-10-CM | POA: Diagnosis not present

## 2020-08-30 MED ORDER — VALSARTAN-HYDROCHLOROTHIAZIDE 80-12.5 MG PO TABS: 1.0000 | ORAL_TABLET | Freq: Every day | ORAL | 3 refills | Status: DC

## 2020-08-30 MED ORDER — DAPAGLIFLOZIN PROPANEDIOL 5 MG PO TABS: 5.0000 mg | ORAL_TABLET | Freq: Every day | ORAL | 3 refills | Status: DC

## 2020-08-30 NOTE — Progress Notes (Signed)
[facility] [address] [place] [address] Dept: [phone] Dept Fax: [phone]  Office Visit  Subjective:    Patient ID: Carlos Montgomery, male    DOB: Feb 05, 1966, 55 y.o..   MRN: [id number]  Chief Complaint  Patient presents with   Follow-up    F/u blood sugar.     History of Present Illness:  Patient is in today for reassessment of his blood pressure and to follow up on the labs from his last visit. He notes that he is doing well overall. He admits to having worries about his kidneys and his liver related ot the number of medications that he is on. He is not noting any particular side effects.  Past Medical History: Patient Active Problem List   Diagnosis Date Noted   Ocular trauma of right eye 03/06/2020   History of inferior wall myocardial infarction 01/19/2015   Erectile dysfunction 01/19/2015   CAD (coronary artery disease), native coronary artery 01/19/2015   Type 2 diabetes mellitus without complication ([place]) 97/04/6376   Hepatic steatosis 06/13/2010   Retinal vascular occlusion 05/23/2010   History of colon polyps 11/15/2007   Peyronie's disease 11/15/2007   Hyperlipidemia 11/11/2007   Anxiety 11/11/2007   Essential hypertension 11/11/2007   GERD 11/11/2007   Past Surgical History:  Procedure Laterality Date   broken wrists     CARDIAC CATHETERIZATION N/A 11/29/2014   Procedure: Left Heart Cath and Coronary Angiography;  Surgeon: Jettie Booze, MD;  Location: [facility];  Service: Cardiovascular;  Laterality: N/A;   CARDIAC CATHETERIZATION N/A 11/29/2014   Procedure: Coronary Stent Intervention;  Surgeon: Jettie Booze, MD;  Location: [facility];  Service: Cardiovascular;  Laterality: N/A;   RETINAL DETACHMENT SURGERY     RHINOPLASTY     TONSILLECTOMY AND ADENOIDECTOMY     Family History  Problem Relation Age of Onset   Heart disease Father    Hyperlipidemia Father     Heart attack Maternal Grandfather 38   Heart attack Paternal Grandfather 57   Colon cancer Neg Hx    Pancreatic cancer Neg Hx    Stomach cancer Neg Hx    Hypertension Neg Hx    Stroke Neg Hx    Outpatient Medications Prior to Visit  Medication Sig Dispense Refill   ALPRAZolam (XANAX) 0.5 MG tablet TAKE 1/2 TO 1 TABLET BY MOUTH TWICE A DAY AS NEEDED FOR ANXIETY 45 tablet 0   Ascorbic Acid (VITAMIN C) 1000 MG tablet Take 1,000 mg by mouth daily.     aspirin 81 MG tablet Take 81 mg by mouth daily.      atorvastatin (LIPITOR) 40 MG tablet Take 1 tablet (40 mg total) by mouth daily. 90 tablet 1   Cinnamon 500 MG TABS Take 3 tablets by mouth daily.     clopidogrel (PLAVIX) 75 MG tablet TAKE 1 TABLET BY MOUTH EVERY DAY 90 tablet 3   fenofibrate 160 MG tablet TAKE 1 TABLET BY MOUTH EVERY DAY *TO REPLACE 134MG  DOSE 90 tablet 1   magnesium oxide (MAG-OX) 400 MG tablet Take 400 mg by mouth daily.     metFORMIN (GLUCOPHAGE) 500 MG tablet Take 2 tablets (1,000 mg total) by mouth daily with breakfast. 180 tablet 3   metoprolol succinate (TOPROL-XL) 50 MG 24 hr tablet TAKE 1 TABLET (50 MG TOTAL) BY MOUTH DAILY. TAKE WITH OR IMMEDIATELY FOLLOWING A MEAL. 30 tablet 9   Multiple Vitamin (THERA) TABS Take 1  tablet by mouth daily.     nitroGLYCERIN (NITROSTAT) 0.4 MG SL tablet Place 1 tablet (0.4 mg total) under the tongue every 5 (five) minutes as needed for chest pain (X3 DOSES BEFORE CALLING 911). 25 tablet 3   Omega-3 Fatty Acids (FISH OIL) 1200 MG CAPS Take 2,400 mg by mouth daily.     pantoprazole (PROTONIX) 40 MG tablet Take 1 tablet (40 mg total) by mouth daily. 30 tablet 5   zinc gluconate 50 MG tablet Take 50 mg by mouth daily.     ezetimibe (ZETIA) 10 MG tablet TAKE 1 TABLET BY MOUTH EVERY DAY 90 tablet 2   valsartan (DIOVAN) 160 MG tablet Take 1 tablet (160 mg total) by mouth daily. 90 tablet 3   No facility-administered medications prior to visit.   Allergies  Allergen Reactions    Peanut-Containing Drug Products Other (See Comments)    Pine nuts only.   Codeine Palpitations    Increases heart rate, rapid heart rate   Niacin Other (See Comments)    unknown   Penicillins Other (See Comments)    Does not know what reaction - happened as a child Was told not to take as a child.   Rosuvastatin Other (See Comments)    Night sweats   Objective:   Today's Vitals   08/30/20 1558  BP: 134/82  Pulse: 80  Temp: 97.6 F (36.4 C)  TempSrc: Temporal  SpO2: 98%  Weight: 177 lb 9.6 oz (80.6 kg)  Height: 5\' 10"  (1.778 m)   Body mass index is 25.48 kg/m.   General: Well developed, well nourished. No acute distress. Psych: Alert and oriented x3. Normal mood and affect.  Health Maintenance Due  Topic Date Due   PNEUMOCOCCAL POLYSACCHARIDE VACCINE AGE 59-64 HIGH RISK  Never done   Hepatitis C Screening  Never done   Zoster Vaccines- Shingrix (1 of 2) Never done   COVID-19 Vaccine (4 - Booster for Pfizer series) 05/22/2020   Lab Results Lab Results  Component Value Date   HGBA1C 7.2 (H) 07/14/2020   Lab Results  Component Value Date   CHOL 86 07/14/2020   HDL 30.60 (L) 07/14/2020   LDLCALC 27 07/14/2020   LDLDIRECT 79.0 06/05/2015   TRIG 143.0 07/14/2020   CHOLHDL 3 07/14/2020   BMP Latest Ref Rng & Units 07/14/2020 03/06/2020 06/23/2019  Glucose 70 - 99 mg/dL 100(H) 108(H) 104(H)  BUN 6 - 23 mg/dL 18 14 13   Creatinine 0.40 - 1.50 mg/dL 1.42 1.17 1.13  Sodium 135 - 145 mEq/L 140 138 136  Potassium 3.5 - 5.1 mEq/L 5.2(H) 4.9 5.1  Chloride 96 - 112 mEq/L 102 104 101  CO2 19 - 32 mEq/L 29 27 29   Calcium 8.4 - 10.5 mg/dL 10.0 9.9 9.7     Assessment & Plan:   1. Type 2 diabetes mellitus without complication, without long-term current use of insulin [facility]) Mr. Mathenia HbA1c remains mildly above goal. In light of his history of coronary disease, I believe the addition of an SGLT2 I would provide both benefit for his diabetes and his heart. We will start  dapagliflozin and reassess his diabetes in 3 months.  - dapagliflozin propanediol (FARXIGA) 5 MG TABS tablet; Take 1 tablet (5 mg total) by mouth daily.  Dispense: 90 tablet; Refill: 3  2. Essential hypertension Blood pressure remains mildly above goal. I will switch him to the combination valsartan/HCTZ to see if we can get his blood pressure down about 10 points.  -  valsartan-hydrochlorothiazide (DIOVAN-HCT) 80-12.5 MG tablet; Take 1 tablet by mouth daily.  Dispense: 90 tablet; Refill: 3  3. Mixed hyperlipidemia Mr. Akel' LDL is quite low. I feel we could give him a trial off Zetia and see if he remains with an LDL < 70. Will plan to reassess his lipids in 3 months.  4. Coronary artery disease involving native coronary artery of native heart without angina pectoris Stable.  Haydee Salter, MD

## 2020-08-30 NOTE — Patient Instructions (Signed)
Stop ezitimibe (Zetia). Stop valsartan (Diovan) and start valsartan/HCTZ (Diovan HCT) Start dapgliflozin [facility])

## 2020-09-02 ENCOUNTER — Other Ambulatory Visit: Payer: Self-pay | Admitting: Family Medicine

## 2020-09-02 DIAGNOSIS — F419 Anxiety disorder, unspecified: Secondary | ICD-10-CM

## 2020-10-13 ENCOUNTER — Ambulatory Visit: Payer: BC Managed Care – PPO | Admitting: Family Medicine

## 2020-10-16 ENCOUNTER — Telehealth: Payer: Self-pay | Admitting: Family Medicine

## 2020-10-16 NOTE — Telephone Encounter (Signed)
Noted. Dm/cma  

## 2020-10-16 NOTE — Telephone Encounter (Signed)
[phone] called from [facility] to help manage pt's healthcare. If you need her help with anything feel free to call her at [phone].

## 2020-11-23 ENCOUNTER — Other Ambulatory Visit: Payer: Self-pay | Admitting: Interventional Cardiology

## 2020-12-06 ENCOUNTER — Ambulatory Visit: Payer: BC Managed Care – PPO | Admitting: Family Medicine

## 2020-12-12 ENCOUNTER — Other Ambulatory Visit: Payer: Self-pay | Admitting: Family Medicine

## 2020-12-12 DIAGNOSIS — E782 Mixed hyperlipidemia: Secondary | ICD-10-CM

## 2020-12-13 ENCOUNTER — Encounter: Payer: Self-pay | Admitting: Family Medicine

## 2020-12-13 ENCOUNTER — Ambulatory Visit: Payer: BC Managed Care – PPO | Admitting: Family Medicine

## 2020-12-13 ENCOUNTER — Other Ambulatory Visit: Payer: Self-pay

## 2020-12-13 VITALS — BP 134/82 | HR 89 | Temp 97.5°F | Ht 70.0 in | Wt 173.2 lb

## 2020-12-13 DIAGNOSIS — E782 Mixed hyperlipidemia: Secondary | ICD-10-CM | POA: Diagnosis not present

## 2020-12-13 DIAGNOSIS — L219 Seborrheic dermatitis, unspecified: Secondary | ICD-10-CM

## 2020-12-13 DIAGNOSIS — I10 Essential (primary) hypertension: Secondary | ICD-10-CM

## 2020-12-13 DIAGNOSIS — I2510 Atherosclerotic heart disease of native coronary artery without angina pectoris: Secondary | ICD-10-CM | POA: Diagnosis not present

## 2020-12-13 DIAGNOSIS — E119 Type 2 diabetes mellitus without complications: Secondary | ICD-10-CM | POA: Diagnosis not present

## 2020-12-13 NOTE — Progress Notes (Signed)
[facility] [address] [place] [address] Dept: [phone] Dept Fax: [phone]  Chronic Care Office Visit  Subjective:    Patient ID: Carlos Montgomery, male    DOB: 09/17/1965, 55 y.o..   MRN: [id number]  Chief Complaint  Patient presents with   Follow-up    3 month f/u.  Declines flu shot.  Has stopped Farxiga and Atorvastatin due to chest tightness when doing things.      History of Present Illness:  Patient is in today for reassessment of chronic medical issues.  Mr. Mowatt has a history of Type 2 diabetes. He had been managed on metformin 1000 mg daily. His last HbA1c in April was 7.2%. At his last appointment, we added Farxiga to his regimen. He has been on this up until 2 weeks ago. We will recheck his A1c today. He also has a history of hypertension and has been managed on metoprolol (more related to his CAD) and valsartan. At his last visit, we switch the valsartan to Diovan to try and get a little lower on his blood pressure.  Mr. Pagnotta has a history of CAD and is s/p inferior STEMI in 2016. He had two RCA stents placed. He is currently on DAPT with aspirin and clopidogrel. Mr. Blucher has a history of hyperlipidemia and is managed on atorvastatin, fenofibrate, and ezetimibe. I had recommended he stop the ezetimibe at his last visit, as his LDL was at 27. However, he has continued to take this. Mr. Mells notes that over the past few weeks, he had developed a burning sensation across his upper chest and upper back with exertion. He denies any chest pain, per se and has not noted dyspnea. He has noted some mild lightheadedness. He stopped taking his Wilder Glade and his Lipitor, thinking these might be the cause. However, the symptoms have continued.  Mr. Galyean notes some mild drainage and "crud" from his outer ears. He is not sure of what this is.  Past Medical History: Patient Active Problem List   Diagnosis Date Noted    Ocular trauma of right eye 03/06/2020   History of inferior wall myocardial infarction 01/19/2015   Erectile dysfunction 01/19/2015   CAD (coronary artery disease), native coronary artery 01/19/2015   Type 2 diabetes mellitus without complication ([facility]) 85/46/2703   Hepatic steatosis 06/13/2010   Retinal vascular occlusion 05/23/2010   History of colon polyps 11/15/2007   Peyronie's disease 11/15/2007   Hyperlipidemia 11/11/2007   Anxiety 11/11/2007   Essential hypertension 11/11/2007   GERD 11/11/2007   Past Surgical History:  Procedure Laterality Date   broken wrists     CARDIAC CATHETERIZATION N/A 11/29/2014   Procedure: Left Heart Cath and Coronary Angiography;  Surgeon: Jettie Booze, MD;  Location: [facility];  Service: Cardiovascular;  Laterality: N/A;   CARDIAC CATHETERIZATION N/A 11/29/2014   Procedure: Coronary Stent Intervention;  Surgeon: Jettie Booze, MD;  Location: [facility];  Service: Cardiovascular;  Laterality: N/A;   RETINAL DETACHMENT SURGERY     RHINOPLASTY     TONSILLECTOMY AND ADENOIDECTOMY     Family History  Problem Relation Age of Onset   Heart disease Father    Hyperlipidemia Father    Heart attack Maternal Grandfather 61   Heart attack Paternal Grandfather 62   Colon cancer Neg Hx    Pancreatic cancer Neg Hx    Stomach cancer Neg Hx    Hypertension Neg Hx  Stroke Neg Hx    Outpatient Medications Prior to Visit  Medication Sig Dispense Refill   ALPRAZolam (XANAX) 0.5 MG tablet TAKE 1/2 TO 1 TABLET BY MOUTH TWICE A DAY AS NEEDED FOR ANXIETY 45 tablet 2   Ascorbic Acid (VITAMIN C) 1000 MG tablet Take 1,000 mg by mouth daily.     aspirin 81 MG tablet Take 81 mg by mouth daily.      atorvastatin (LIPITOR) 40 MG tablet Take 1 tablet (40 mg total) by mouth daily. 90 tablet 1   Cinnamon 500 MG TABS Take 3 tablets by mouth daily.     clopidogrel (PLAVIX) 75 MG tablet TAKE 1 TABLET BY MOUTH EVERY DAY 90 tablet 3    dapagliflozin propanediol (FARXIGA) 5 MG TABS tablet Take 1 tablet (5 mg total) by mouth daily. 90 tablet 3   fenofibrate 160 MG tablet TAKE 1 TABLET BY MOUTH EVERY DAY *TO REPLACE 134MG  DOSE 90 tablet 3   magnesium oxide (MAG-OX) 400 MG tablet Take 400 mg by mouth daily.     metFORMIN (GLUCOPHAGE) 500 MG tablet Take 2 tablets (1,000 mg total) by mouth daily with breakfast. 180 tablet 3   metoprolol succinate (TOPROL-XL) 50 MG 24 hr tablet TAKE 1 TABLET BY MOUTH DAILY. TAKE WITH OR IMMEDIATELY FOLLOWING A MEAL. 90 tablet 1   Multiple Vitamin (THERA) TABS Take 1 tablet by mouth daily.     nitroGLYCERIN (NITROSTAT) 0.4 MG SL tablet Place 1 tablet (0.4 mg total) under the tongue every 5 (five) minutes as needed for chest pain (X3 DOSES BEFORE CALLING 911). 25 tablet 3   Omega-3 Fatty Acids (FISH OIL) 1200 MG CAPS Take 2,400 mg by mouth daily.     pantoprazole (PROTONIX) 40 MG tablet Take 1 tablet (40 mg total) by mouth daily. 30 tablet 5   valsartan-hydrochlorothiazide (DIOVAN-HCT) 80-12.5 MG tablet Take 1 tablet by mouth daily. 90 tablet 3   zinc gluconate 50 MG tablet Take 50 mg by mouth daily.     No facility-administered medications prior to visit.   Allergies  Allergen Reactions   Peanut-Containing Drug Products Other (See Comments)    Pine nuts only.   Codeine Palpitations    Increases heart rate, rapid heart rate   Niacin Other (See Comments)    unknown   Penicillins Other (See Comments)    Does not know what reaction - happened as a child Was told not to take as a child.   Rosuvastatin Other (See Comments)    Night sweats    Objective:   Today's Vitals   12/13/20 1534  BP: 134/82  Pulse: 89  Temp: (!) 97.5 F (36.4 C)  TempSrc: Temporal  SpO2: 98%  Weight: 173 lb 3.2 oz (78.6 kg)  Height: 5\' 10"  (1.778 m)   Body mass index is 24.85 kg/m.   General: Well developed, well nourished. No acute distress. Ears: There is an oily scale around the external auditory meatus,  bilaterally. EAC and TMs are clear. CV: RRR without murmurs or rubs. Pulses 2+ bilaterally. Psych: Alert and oriented. Normal mood and affect.  Health Maintenance Due  Topic Date Due   Hepatitis C Screening  Never done   Zoster Vaccines- Shingrix (1 of 2) Never done   COVID-19 Vaccine (4 - Booster for Pfizer series) 05/22/2020   INFLUENZA VACCINE  Never done     Assessment & Plan:   1. Type 2 diabetes mellitus without complication, without long-term current use of insulin (HCC) Due for A1c  today. I recommended Mr. Ohern try restarting the Wilder Glade and continue if his lightheadedness does not recur.  - Glucose, random - Hemoglobin A1c  2. Essential hypertension Blood pressure is acceptable today. Continue Diovan and metoprolol.  3. Coronary artery disease involving native coronary artery of native heart without angina pectoris The chest burning with exertion is concerning in light of prior CAD. I will refer Mr. Privott back to [name] (cardiology) to consider if he needs an assessment for any new coronary blockage or an echocardiogram.  - Ambulatory referral to Cardiology  4. Mixed hyperlipidemia Lipids were at goal in April. Will plan to repeat at his next visit.  5. Seborrhea Recommend he use HC cream on a cotton swab around the external auditory meatus twice a day until resolved.  Haydee Salter, MD

## 2020-12-13 NOTE — Patient Instructions (Signed)
Use hydrocortisone 0.1% cream. Apply a small amount with cotton swab tot he opening of the ear canals twice a day.

## 2020-12-14 LAB — HEMOGLOBIN A1C: Hgb A1c MFr Bld: 7 % — ABNORMAL HIGH (ref 4.6–6.5)

## 2020-12-14 LAB — GLUCOSE, RANDOM: Glucose, Bld: 173 mg/dL — ABNORMAL HIGH (ref 70–99)

## 2020-12-19 ENCOUNTER — Telehealth: Payer: Self-pay | Admitting: Interventional Cardiology

## 2020-12-19 DIAGNOSIS — I25118 Atherosclerotic heart disease of native coronary artery with other forms of angina pectoris: Secondary | ICD-10-CM

## 2020-12-19 DIAGNOSIS — R0609 Other forms of dyspnea: Secondary | ICD-10-CM

## 2020-12-19 DIAGNOSIS — R072 Precordial pain: Secondary | ICD-10-CM

## 2020-12-19 NOTE — Telephone Encounter (Signed)
Patient states he got a call a week ago about scheduling an appointment and was offered to schedule with a PA, but he requested to see [name]. He says they told him they would send a message to the nurse and he would receive a call back, but he has not. I did not see any notes and am not sure if it was sent as a staff message.

## 2020-12-19 NOTE — Telephone Encounter (Signed)
Spoke with pt and he states that PCP sent over an urgent referral for him to see Carlos Montgomery.  He wasn't sure exactly why the PCP considered it urgent but did mention that he has been having some SOB and chest burning.  He said since PCP felt the referral was urgent, he didn't want to see an APP.  Advised I will send message to Carlos Montgomery nurse to see if she can get him in soon but no guarantees.  Per Carlos Montgomery note: 3. Coronary artery disease involving native coronary artery of native heart without angina pectoris The chest burning with exertion is concerning in light of prior CAD. I will refer Carlos Montgomery back to Carlos Montgomery (cardiology) to consider if he needs an assessment for any new coronary blockage or an echocardiogram.  Will route to Carlos Montgomery to see if he wants any testing completed in the meantime.

## 2020-12-19 NOTE — Telephone Encounter (Signed)
Called pt and reviewed MD recommendations.  He is agreeable to plan of care.  Order placed.  I reviewed instructions with pt over the phone and have mailed out a copy of instructions to pt.  All questions answered told to call the office with any questions or concerns.  Attestation order pend for MD to sign.  Message sent to scheduling pool.

## 2020-12-19 NOTE — Telephone Encounter (Signed)
I spoke to the patient and he is agreeable to exercise myoview. OK to switch to lexiscan if he cannot walk.  Sx are exertional but different that his MI.  We discussed cath but he has not had his typical anginal sx so will plan for cath if stress is abnormal. No NTG use at this point.

## 2020-12-20 ENCOUNTER — Telehealth (HOSPITAL_COMMUNITY): Payer: Self-pay | Admitting: *Deleted

## 2020-12-20 NOTE — Telephone Encounter (Signed)
Patient given detailed instructions per Myocardial Perfusion Study Information Sheet for the test on 12/27/20 at 0800. Patient notified to arrive 15 minutes early and that it is imperative to arrive on time for appointment to keep from having the test rescheduled.  If you need to cancel or reschedule your appointment, please call the office within 24 hours of your appointment. . Patient verbalized understanding.Sareen Randon, [place] No mychart available.

## 2020-12-27 ENCOUNTER — Other Ambulatory Visit: Payer: Self-pay

## 2020-12-27 ENCOUNTER — Ambulatory Visit (HOSPITAL_COMMUNITY): Payer: BC Managed Care – PPO | Attending: Internal Medicine

## 2020-12-27 DIAGNOSIS — R0609 Other forms of dyspnea: Secondary | ICD-10-CM | POA: Insufficient documentation

## 2020-12-27 DIAGNOSIS — R072 Precordial pain: Secondary | ICD-10-CM | POA: Insufficient documentation

## 2020-12-27 LAB — MYOCARDIAL PERFUSION IMAGING
Angina Index: 0
Base ST Depression (mm): 0 mm
Duke Treadmill Score: 5
Estimated workload: 7
Exercise duration (min): 5 min
Exercise duration (sec): 15 s
LV dias vol: 30 mL (ref 62–150)
MPHR: 167 {beats}/min
Nuc Stress EF: 65 %
Peak HR: 153 {beats}/min
Percent HR: 91 %
RATE: 86
Rest HR: 77 {beats}/min
Rest Nuclear Isotope Dose: 10.2 mCi
SDS: 0
SRS: 0
SSS: 0
ST Depression (mm): 0 mm
Stress Nuclear Isotope Dose: 32.6 mCi
TID: 0.99

## 2020-12-27 MED ORDER — TECHNETIUM TC 99M TETROFOSMIN IV KIT
10.2000 | PACK | Freq: Once | INTRAVENOUS | Status: AC | PRN
  Administered 2020-12-27: 10.2 via INTRAVENOUS
  Filled 2020-12-27: qty 11

## 2020-12-27 MED ORDER — TECHNETIUM TC 99M TETROFOSMIN IV KIT
32.6000 | PACK | Freq: Once | INTRAVENOUS | Status: AC | PRN
  Administered 2020-12-27: 32.6 via INTRAVENOUS
  Filled 2020-12-27: qty 33

## 2021-01-08 ENCOUNTER — Other Ambulatory Visit: Payer: Self-pay | Admitting: Family Medicine

## 2021-01-08 DIAGNOSIS — F419 Anxiety disorder, unspecified: Secondary | ICD-10-CM

## 2021-01-08 NOTE — Telephone Encounter (Signed)
Refill request for:  Alprazolam 0.5 mg LR  09/04/20, # 45, 2 rf LOV 9/04/25/20 FOV  none scheduled.    Please review and advise.  Thanks. Dm/cma

## 2021-01-09 ENCOUNTER — Other Ambulatory Visit: Payer: Self-pay | Admitting: Family Medicine

## 2021-01-09 DIAGNOSIS — K219 Gastro-esophageal reflux disease without esophagitis: Secondary | ICD-10-CM

## 2021-04-03 NOTE — Progress Notes (Signed)
Cardiology Office Note   Date:  04/04/2021   ID:  Carlos Montgomery, DOB 02/23/1966, MRN [id number]  PCP:  Haydee Salter, MD    Chief Complaint  Patient presents with   Follow-up   CAD  Wt Readings from Last 3 Encounters:  04/04/21 170 lb (77.1 kg)  12/27/20 173 lb (78.5 kg)  12/13/20 173 lb 3.2 oz (78.6 kg)       History of Present Illness: Carlos Montgomery is a 56 y.o. male  Who had inferior STEMI in 9/16.  He thinks the DOE was building prior to this.  He received 2 stents to the RCA.  He did well with the procedure.  Angina was terrible heartburn feeling.     Has been on DAPT due to bifurcation stenting at the time of STEMI.    In 02/2018: "Has some stress in his life with a divorce. He has used all of his Xanax for the month. "   Of note, his son is a Air cabin crew and is on scholarship at a college in [place].  Had dizziness in 10/22. Stress test was negative. Happened after meds were changed.   Since then, Denies : Chest pain. Dizziness. Leg edema. Nitroglycerin use. Orthopnea. Palpitations. Paroxysmal nocturnal dyspnea. Shortness of breath. Syncope.    Working 70 hrs/week.  Walking is main exercise.      Past Medical History:  Diagnosis Date   CAD (coronary artery disease)    cath 11/29/2014 bifurcation [place] stent to distal RCA and ost PDA treated with 2.75x28 mm Synergy stent, and ost PLA with 2.5x102mm DES, 90% small OM2   Diabetes mellitus    metformin   Hyperlipemia    Hypertension    Pulmonary nodule 09/06/2010   Tobacco abuse 11/29/2014    Past Surgical History:  Procedure Laterality Date   broken wrists     CARDIAC CATHETERIZATION N/A 11/29/2014   Procedure: Left Heart Cath and Coronary Angiography;  Surgeon: Jettie Booze, MD;  Location: [facility];  Service: Cardiovascular;  Laterality: N/A;   CARDIAC CATHETERIZATION N/A 11/29/2014   Procedure: Coronary Stent Intervention;  Surgeon: Jettie Booze, MD;  Location: [facility];  Service: Cardiovascular;  Laterality: N/A;   RETINAL DETACHMENT SURGERY     RHINOPLASTY     TONSILLECTOMY AND ADENOIDECTOMY       Current Outpatient Medications  Medication Sig Dispense Refill   ALPRAZolam (XANAX) 0.5 MG tablet TAKE 1/2 TO 1 TABLET BY MOUTH TWICE A DAY AS NEEDED FOR ANXIETY 45 tablet 2   Ascorbic Acid (VITAMIN C) 1000 MG tablet Take 1,000 mg by mouth daily.     aspirin 81 MG tablet Take 81 mg by mouth daily.      atorvastatin (LIPITOR) 40 MG tablet Take 1 tablet (40 mg total) by mouth daily. 90 tablet 1   Cinnamon 500 MG TABS Take 3 tablets by mouth daily.     clopidogrel (PLAVIX) 75 MG tablet TAKE 1 TABLET BY MOUTH EVERY DAY 90 tablet 3   dapagliflozin propanediol (FARXIGA) 5 MG TABS tablet Take 1 tablet (5 mg total) by mouth daily. 90 tablet 3   fenofibrate 160 MG tablet TAKE 1 TABLET BY MOUTH EVERY DAY *TO REPLACE 134MG  DOSE 90 tablet 3   magnesium oxide (MAG-OX) 400 MG tablet Take 400 mg by mouth daily.     metFORMIN (GLUCOPHAGE) 500 MG tablet Take 2 tablets (1,000 mg total) by mouth daily with breakfast. 180 tablet 3  metoprolol succinate (TOPROL-XL) 50 MG 24 hr tablet TAKE 1 TABLET BY MOUTH DAILY. TAKE WITH OR IMMEDIATELY FOLLOWING A MEAL. 90 tablet 1   Multiple Vitamin (THERA) TABS Take 1 tablet by mouth daily.     nitroGLYCERIN (NITROSTAT) 0.4 MG SL tablet Place 1 tablet (0.4 mg total) under the tongue every 5 (five) minutes as needed for chest pain (X3 DOSES BEFORE CALLING 911). 25 tablet 3   Omega-3 Fatty Acids (FISH OIL) 1200 MG CAPS Take 2,400 mg by mouth daily.     pantoprazole (PROTONIX) 40 MG tablet TAKE 1 TABLET BY MOUTH EVERY DAY 30 tablet 5   valsartan-hydrochlorothiazide (DIOVAN-HCT) 80-12.5 MG tablet Take 1 tablet by mouth daily. 90 tablet 3   zinc gluconate 50 MG tablet Take 50 mg by mouth daily.     No current facility-administered medications for this visit.    Allergies:   Peanut-containing drug products, Codeine, Niacin, Penicillins, and  Rosuvastatin    Social History:  The patient  reports that he quit smoking about 23 years ago. His smoking use included cigarettes. He has never used smokeless tobacco. He reports that he does not drink alcohol and does not use drugs.   Family History:  The patient's family history includes Heart attack (age of onset: 73) in his paternal grandfather; Heart attack (age of onset: 34) in his maternal grandfather; Heart disease in his father; Hyperlipidemia in his father.    ROS:  Please see the history of present illness.   Otherwise, review of systems are positive for dizziness.   All other systems are reviewed and negative.    PHYSICAL EXAM: VS:  BP 110/78    Pulse 83    Ht 5\' 10"  (1.778 m)    Wt 170 lb (77.1 kg)    SpO2 96%    BMI 24.39 kg/m  , BMI Body mass index is 24.39 kg/m. GEN: Well nourished, well developed, in no acute distress HEENT: normal Neck: no JVD, carotid bruits, or masses Cardiac: RRR; no murmurs, rubs, or gallops,no edema  Respiratory:  clear to auscultation bilaterally, normal work of breathing GI: soft, nontender, nondistended, + BS MS: no deformity or atrophy Skin: warm and dry, no rash Neuro:  Strength and sensation are intact Psych: euthymic mood, full affect   EKG:   The ekg ordered today demonstrates NSR, no ST changes   Recent Labs: 07/14/2020: BUN 18; Creatinine, Ser 1.42; Potassium 5.2; Sodium 140   Lipid Panel    Component Value Date/Time   CHOL 86 07/14/2020 1427   CHOL 116 03/17/2019 1640   TRIG 143.0 07/14/2020 1427   HDL 30.60 (L) 07/14/2020 1427   HDL 37 (L) 03/17/2019 1640   CHOLHDL 3 07/14/2020 1427   VLDL 28.6 07/14/2020 1427   LDLCALC 27 07/14/2020 1427   LDLCALC 59 03/17/2019 1640   LDLDIRECT 79.0 06/05/2015 1023     Other studies Reviewed: Additional studies/ records that were reviewed today with results demonstrating: stress test normal; normal labs.   ASSESSMENT AND PLAN:  CAD/old MI: Continue clopidogrel monotherapy.  No  ischemia by stress test.  No bleeding problems.  Hyperlipidemia: The current medical regimen is effective;  continue present plan and medications.  LDL 27.  Hypertension: The current medical regimen is effective;  continue present plan and medications. Low salt diet.  Avoid processed foods.  Diabetes: Whole food, plant-based diet recommended.  Increase fiber intake.  Exercise to target noted below.   Current medicines are reviewed at length with the patient  today.  The patient concerns regarding his medicines were addressed.  The following changes have been made:  No change  Labs/ tests ordered today include:  No orders of the defined types were placed in this encounter.   Recommend 150 minutes/week of aerobic exercise Low fat, low carb, high fiber diet recommended  Disposition:   FU in 1 year   Signed, Larae Grooms, MD  04/04/2021 1:54 PM    [facility] [place], [place], [place]  [address] Phone: [phone]; Fax: [phone]

## 2021-04-04 ENCOUNTER — Ambulatory Visit (INDEPENDENT_AMBULATORY_CARE_PROVIDER_SITE_OTHER): Payer: BC Managed Care – PPO | Admitting: Interventional Cardiology

## 2021-04-04 ENCOUNTER — Encounter: Payer: Self-pay | Admitting: Interventional Cardiology

## 2021-04-04 ENCOUNTER — Other Ambulatory Visit: Payer: Self-pay

## 2021-04-04 VITALS — BP 110/78 | HR 83 | Ht 70.0 in | Wt 170.0 lb

## 2021-04-04 DIAGNOSIS — I252 Old myocardial infarction: Secondary | ICD-10-CM

## 2021-04-04 DIAGNOSIS — R0609 Other forms of dyspnea: Secondary | ICD-10-CM | POA: Diagnosis not present

## 2021-04-04 DIAGNOSIS — I25118 Atherosclerotic heart disease of native coronary artery with other forms of angina pectoris: Secondary | ICD-10-CM | POA: Diagnosis not present

## 2021-04-04 DIAGNOSIS — E782 Mixed hyperlipidemia: Secondary | ICD-10-CM

## 2021-04-04 DIAGNOSIS — I10 Essential (primary) hypertension: Secondary | ICD-10-CM

## 2021-04-04 NOTE — Patient Instructions (Signed)
Medication Instructions:  Your physician recommends that you continue on your current medications as directed. Please refer to the Current Medication list given to you today.  *If you need a refill on your cardiac medications before your next appointment, please call your pharmacy*   Lab Work: none If you have labs (blood work) drawn today and your tests are completely normal, you will receive your results only by: [place] (if you have MyChart) OR A paper copy in the mail If you have any lab test that is abnormal or we need to change your treatment, we will call you to review the results.   Testing/Procedures: none   Follow-Up: At [facility], you and your health needs are our priority.  As part of our continuing mission to provide you with exceptional heart care, we have created designated Provider Care Teams.  These Care Teams include your primary Cardiologist (physician) and Advanced Practice Providers (APPs -  Physician Assistants and Nurse Practitioners) who all work together to provide you with the care you need, when you need it.  We recommend signing up for the patient portal called "MyChart".  Sign up information is provided on this After Visit Summary.  MyChart is used to connect with patients for Virtual Visits (Telemedicine).  Patients are able to view lab/test results, encounter notes, upcoming appointments, etc.  Non-urgent messages can be sent to your provider as well.   To learn more about what you can do with MyChart, go to [email or web address].    Your next appointment:   12 month(s)  The format for your next appointment:   In Person  Provider:   Dr Irish Lack If MD is not listed, click here to update    :1}    Other Instructions High-Fiber Eating Plan Fiber, also called dietary fiber, is a type of carbohydrate. It is found foods such as fruits, vegetables, whole grains, and beans. A high-fiber diet can have many health benefits. Your health care  provider may recommend a high-fiber diet to help: Prevent constipation. Fiber can make your bowel movements more regular. Lower your cholesterol. Relieve the following conditions: Inflammation of veins in the anus (hemorrhoids). Inflammation of specific areas of the digestive tract (uncomplicated diverticulosis). A problem of the large intestine, also called the colon, that sometimes causes pain and diarrhea (irritable bowel syndrome, or IBS). Prevent overeating as part of a weight-loss plan. Prevent heart disease, type 2 diabetes, and certain cancers. What are tips for following this plan? Reading food labels  Check the nutrition facts label on food products for the amount of dietary fiber. Choose foods that have 5 grams of fiber or more per serving. The goals for recommended daily fiber intake include: Men (age 32 or younger): 34-38 g. Men (over age 16): 28-34 g. Women (age 68 or younger): 25-28 g. Women (over age 99): 22-25 g. Your daily fiber goal is _____________ g. Shopping Choose whole fruits and vegetables instead of processed forms, such as apple juice or applesauce. Choose a wide variety of high-fiber foods such as avocados, lentils, oats, and kidney beans. Read the nutrition facts label of the foods you choose. Be aware of foods with added fiber. These foods often have high sugar and sodium amounts per serving. Cooking Use whole-grain flour for baking and cooking. Cook with brown rice instead of white rice. Meal planning Start the day with a breakfast that is high in fiber, such as a cereal that contains 5 g of fiber or more per serving. Eat breads  and cereals that are made with whole-grain flour instead of refined flour or white flour. Eat brown rice, bulgur wheat, or millet instead of white rice. Use beans in place of meat in soups, salads, and pasta dishes. Be sure that half of the grains you eat each day are whole grains. General information You can get the recommended  daily intake of dietary fiber by: Eating a variety of fruits, vegetables, grains, nuts, and beans. Taking a fiber supplement if you are not able to take in enough fiber in your diet. It is better to get fiber through food than from a supplement. Gradually increase how much fiber you consume. If you increase your intake of dietary fiber too quickly, you may have bloating, cramping, or gas. Drink plenty of water to help you digest fiber. Choose high-fiber snacks, such as berries, raw vegetables, nuts, and popcorn. What foods should I eat? Fruits Berries. Pears. Apples. Oranges. Avocado. Prunes and raisins. Dried figs. Vegetables Sweet potatoes. Spinach. Kale. Artichokes. Cabbage. Broccoli. Cauliflower. Green peas. Carrots. Squash. Grains Whole-grain breads. Multigrain cereal. Oats and oatmeal. Brown rice. Barley. Bulgur wheat. [place]. Quinoa. Bran muffins. Popcorn. Rye wafer crackers. Meats and other proteins Navy beans, kidney beans, and pinto beans. Soybeans. Split peas. Lentils. Nuts and seeds. Dairy Fiber-fortified yogurt. Beverages Fiber-fortified soy milk. Fiber-fortified orange juice. Other foods Fiber bars. The items listed above may not be a complete list of recommended foods and beverages. Contact a dietitian for more information. What foods should I avoid? Fruits Fruit juice. Cooked, strained fruit. Vegetables Fried potatoes. Canned vegetables. Well-cooked vegetables. Grains White bread. Pasta made with refined flour. White rice. Meats and other proteins Fatty cuts of meat. Fried chicken or fried fish. Dairy Milk. Yogurt. Cream cheese. Sour cream. Fats and oils Butters. Beverages Soft drinks. Other foods Cakes and pastries. The items listed above may not be a complete list of foods and beverages to avoid. Talk with your dietitian about what choices are best for you. Summary Fiber is a type of carbohydrate. It is found in foods such as fruits, vegetables, whole  grains, and beans. A high-fiber diet has many benefits. It can help to prevent constipation, lower blood cholesterol, aid weight loss, and reduce your risk of heart disease, diabetes, and certain cancers. Increase your intake of fiber gradually. Increasing fiber too quickly may cause cramping, bloating, and gas. Drink plenty of water while you increase the amount of fiber you consume. The best sources of fiber include whole fruits and vegetables, whole grains, nuts, seeds, and beans. This information is not intended to replace advice given to you by your health care provider. Make sure you discuss any questions you have with your health care provider. Document Revised: 07/08/2019 Document Reviewed: 07/08/2019 Elsevier Patient Education  2022 [employer].

## 2021-04-14 ENCOUNTER — Other Ambulatory Visit: Payer: Self-pay | Admitting: Interventional Cardiology

## 2021-05-31 ENCOUNTER — Other Ambulatory Visit: Payer: Self-pay | Admitting: Family Medicine

## 2021-05-31 NOTE — Telephone Encounter (Signed)
Refill request for  ?Alprazolam 0.5 mg ?LR 01/08/21, #45, 2 rf ?LOV 12/13/20 ?FOV  none scheduled.  ? ?Please review and advise.   ?Thanks. Dm/cma ? ?

## 2021-06-01 LAB — HM DIABETES EYE EXAM

## 2021-07-02 LAB — HM DIABETES EYE EXAM

## 2021-08-02 ENCOUNTER — Other Ambulatory Visit: Payer: Self-pay | Admitting: Family Medicine

## 2021-08-02 ENCOUNTER — Other Ambulatory Visit: Payer: Self-pay | Admitting: Interventional Cardiology

## 2021-08-02 DIAGNOSIS — E119 Type 2 diabetes mellitus without complications: Secondary | ICD-10-CM

## 2021-08-02 DIAGNOSIS — I10 Essential (primary) hypertension: Secondary | ICD-10-CM

## 2021-08-02 DIAGNOSIS — K219 Gastro-esophageal reflux disease without esophagitis: Secondary | ICD-10-CM

## 2021-10-01 ENCOUNTER — Other Ambulatory Visit: Payer: Self-pay | Admitting: Family Medicine

## 2021-10-01 DIAGNOSIS — F419 Anxiety disorder, unspecified: Secondary | ICD-10-CM

## 2021-10-15 ENCOUNTER — Ambulatory Visit: Payer: BC Managed Care – PPO | Admitting: Family Medicine

## 2021-10-25 ENCOUNTER — Encounter: Payer: Self-pay | Admitting: Family Medicine

## 2021-10-25 ENCOUNTER — Other Ambulatory Visit: Payer: Self-pay | Admitting: Family Medicine

## 2021-10-25 ENCOUNTER — Ambulatory Visit: Payer: BC Managed Care – PPO | Admitting: Family Medicine

## 2021-10-25 VITALS — BP 138/80 | HR 88 | Temp 98.0°F | Wt 168.4 lb

## 2021-10-25 DIAGNOSIS — E782 Mixed hyperlipidemia: Secondary | ICD-10-CM | POA: Diagnosis not present

## 2021-10-25 DIAGNOSIS — F419 Anxiety disorder, unspecified: Secondary | ICD-10-CM

## 2021-10-25 DIAGNOSIS — I10 Essential (primary) hypertension: Secondary | ICD-10-CM

## 2021-10-25 DIAGNOSIS — I2510 Atherosclerotic heart disease of native coronary artery without angina pectoris: Secondary | ICD-10-CM

## 2021-10-25 DIAGNOSIS — E119 Type 2 diabetes mellitus without complications: Secondary | ICD-10-CM | POA: Diagnosis not present

## 2021-10-25 DIAGNOSIS — N529 Male erectile dysfunction, unspecified: Secondary | ICD-10-CM

## 2021-10-25 DIAGNOSIS — Z1159 Encounter for screening for other viral diseases: Secondary | ICD-10-CM

## 2021-10-25 DIAGNOSIS — K219 Gastro-esophageal reflux disease without esophagitis: Secondary | ICD-10-CM

## 2021-10-25 LAB — BASIC METABOLIC PANEL
BUN: 19 mg/dL (ref 6–23)
CO2: 26 mEq/L (ref 19–32)
Calcium: 10.2 mg/dL (ref 8.4–10.5)
Chloride: 98 mEq/L (ref 96–112)
Creatinine, Ser: 1.43 mg/dL (ref 0.40–1.50)
GFR: 55.7 mL/min — ABNORMAL LOW (ref >60.00)
Glucose, Bld: 101 mg/dL — ABNORMAL HIGH (ref 70–99)
Potassium: 4.2 mEq/L (ref 3.5–5.1)
Sodium: 135 mEq/L (ref 135–145)

## 2021-10-25 LAB — URINALYSIS, ROUTINE W REFLEX MICROSCOPIC
Bilirubin Urine: NEGATIVE
Hgb urine dipstick: NEGATIVE
Ketones, ur: NEGATIVE
Leukocytes,Ua: NEGATIVE
Nitrite: NEGATIVE
RBC / HPF: NONE SEEN (ref 0–?)
Specific Gravity, Urine: 1.01 (ref 1.000–1.030)
Total Protein, Urine: NEGATIVE
Urine Glucose: >=1000 — AB
Urobilinogen, UA: 0.2 (ref 0.0–1.0)
WBC, UA: NONE SEEN (ref 0–?)
pH: 7 (ref 5.0–8.0)

## 2021-10-25 LAB — MICROALBUMIN / CREATININE URINE RATIO
Creatinine,U: 94.5 mg/dL
Microalb Creat Ratio: 6.7 mg/g (ref 0.0–30.0)
Microalb, Ur: 6.3 mg/dL — ABNORMAL HIGH (ref 0.0–1.9)

## 2021-10-25 LAB — LIPID PANEL
Cholesterol: 243 mg/dL — ABNORMAL HIGH (ref 0–200)
HDL: 33.9 mg/dL — ABNORMAL LOW (ref >39.00)
NonHDL: 208.9
Total CHOL/HDL Ratio: 7
Triglycerides: 380 mg/dL — ABNORMAL HIGH (ref 0.0–149.0)
VLDL: 76 mg/dL — ABNORMAL HIGH (ref 0.0–40.0)

## 2021-10-25 LAB — HEMOGLOBIN A1C: Hgb A1c MFr Bld: 6.9 % — ABNORMAL HIGH (ref 4.6–6.5)

## 2021-10-25 LAB — LDL CHOLESTEROL, DIRECT: Direct LDL: 164 mg/dL

## 2021-10-25 MED ORDER — ATORVASTATIN CALCIUM 40 MG PO TABS: 40.0000 mg | ORAL_TABLET | Freq: Every day | ORAL | 3 refills | Status: DC

## 2021-10-25 MED ORDER — PANTOPRAZOLE SODIUM 40 MG PO TBEC: 40.0000 mg | DELAYED_RELEASE_TABLET | Freq: Every day | ORAL | 3 refills | Status: DC

## 2021-10-25 MED ORDER — ALPRAZOLAM 0.5 MG PO TABS: ORAL_TABLET | ORAL | 0 refills | Status: DC

## 2021-10-25 MED ORDER — TELMISARTAN-HCTZ 80-25 MG PO TABS: 1.0000 | ORAL_TABLET | Freq: Every day | ORAL | 3 refills | Status: DC

## 2021-10-25 MED ORDER — METFORMIN HCL 500 MG PO TABS: 500.0000 mg | ORAL_TABLET | Freq: Every day | ORAL | 3 refills | Status: DC

## 2021-10-25 MED ORDER — VALSARTAN-HYDROCHLOROTHIAZIDE 160-12.5 MG PO TABS: 1.0000 | ORAL_TABLET | Freq: Every day | ORAL | 3 refills | Status: DC

## 2021-10-25 MED ORDER — DAPAGLIFLOZIN PROPANEDIOL 5 MG PO TABS: 5.0000 mg | ORAL_TABLET | Freq: Every day | ORAL | 3 refills | Status: DC

## 2021-10-25 MED ORDER — FENOFIBRATE 160 MG PO TABS: 160.0000 mg | ORAL_TABLET | Freq: Every day | ORAL | 3 refills | Status: DC

## 2021-10-25 NOTE — Addendum Note (Signed)
Addended by: Haydee Salter on: 10/25/2021 04:02 PM   Modules accepted: Orders

## 2021-10-25 NOTE — Progress Notes (Addendum)
[facility] [address] [place] [address] Dept: [phone] Dept Fax: [phone]  Chronic Care Office Visit  Subjective:    Patient ID: Carlos Montgomery, male    DOB: 01-Feb-1966, 56 y.o..   MRN: [id number]  Chief Complaint  Patient presents with   Follow-up    F/u meds.  Fasting today.   No concerns.     History of Present Illness:  Patient is in today for reassessment of chronic medical issues.  [name] has a history of Type 2 diabetes. He had been managed on metformin 1000 mg daily and dapagliflozin (farxiga) 5 mg daily. It has been almost a year since his last appointment.   [name] has a history of hypertension and has been managed on metoprolol 50 mg daily (more related to his CAD) and Diovan-HCT 80-12.5 mg daily .    [name] has a history of CAD and is s/p inferior STEMI in 2016. He had two RCA stents placed. He is currently on DAPT with aspirin 81 mg daily and clopidogrel 75 mg daily. Mr. Baby has a history of hyperlipidemia and is managed on atorvastatin 40 mg daily, and fenofibrate 160 mg daily. He follows with [name].  [name] has a history of ED. He has been going through some marital difficulties, but is trying to reconcile with his wife. He had received sildenafil 50 mg through "[facility]". He isn't sure that the generic sildenafil works as well as the name brand.  [name] has a history of GERD managed on pantoprazole 40 mg daily.  [name] has a history of situational anxiety. 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 He notes this ends up being 3-4 times a week.   Past Medical History: Patient Active Problem List   Diagnosis Date Noted   Ocular trauma of right eye 03/06/2020   History of inferior wall myocardial infarction 01/19/2015   Erectile  dysfunction 01/19/2015   CAD (coronary artery disease), native coronary artery 01/19/2015   Type 2 diabetes mellitus without complication ([place]) 42/70/6237   Hepatic steatosis 06/13/2010   Retinal vascular occlusion 05/23/2010   History of colon polyps 11/15/2007   Peyronie's disease 11/15/2007   Hyperlipidemia 11/11/2007   Anxiety 11/11/2007   Essential hypertension 11/11/2007   GERD 11/11/2007   Past Surgical History:  Procedure Laterality Date   broken wrists     CARDIAC CATHETERIZATION N/A 11/29/2014   Procedure: Left Heart Cath and Coronary Angiography;  Surgeon: Jettie Booze, MD;  Location: [facility];  Service: Cardiovascular;  Laterality: N/A;   CARDIAC CATHETERIZATION N/A 11/29/2014   Procedure: Coronary Stent Intervention;  Surgeon: Jettie Booze, MD;  Location: [facility];  Service: Cardiovascular;  Laterality: N/A;   RETINAL DETACHMENT SURGERY     RHINOPLASTY     TONSILLECTOMY AND ADENOIDECTOMY     Family History  Problem Relation Age of Onset   Heart disease Father    Hyperlipidemia Father    Heart attack Maternal Grandfather 76   Heart attack Paternal Grandfather 11   Colon cancer Neg Hx    Pancreatic cancer Neg Hx    Stomach cancer Neg Hx    Hypertension Neg Hx    Stroke Neg Hx    Outpatient Medications Prior  to Visit  Medication Sig Dispense Refill   sildenafil (VIAGRA) 50 MG tablet Take 50 mg by mouth daily as needed for erectile dysfunction.     Ascorbic Acid (VITAMIN C) 1000 MG tablet Take 1,000 mg by mouth daily.     aspirin 81 MG tablet Take 81 mg by mouth daily.      Cinnamon 500 MG TABS Take 3 tablets by mouth daily.     clopidogrel (PLAVIX) 75 MG tablet TAKE 1 TABLET BY MOUTH EVERY DAY 90 tablet 2   magnesium oxide (MAG-OX) 400 MG tablet Take 400 mg by mouth daily.     metoprolol succinate (TOPROL-XL) 50 MG 24 hr tablet TAKE 1 TABLET BY MOUTH EVERY DAY WITH OR IMMEDIATELY FOLLOWING A MEAL 90 tablet 3   Multiple Vitamin  (THERA) TABS Take 1 tablet by mouth daily.     nitroGLYCERIN (NITROSTAT) 0.4 MG SL tablet Place 1 tablet (0.4 mg total) under the tongue every 5 (five) minutes as needed for chest pain (X3 DOSES BEFORE CALLING 911). 25 tablet 3   Omega-3 Fatty Acids (FISH OIL) 1200 MG CAPS Take 2,400 mg by mouth daily.     valsartan-hydrochlorothiazide (DIOVAN-HCT) 80-12.5 MG tablet TAKE 1 TABLET BY MOUTH EVERY DAY 90 tablet 0   zinc gluconate 50 MG tablet Take 50 mg by mouth daily.     ALPRAZolam (XANAX) 0.5 MG tablet TAKE 1/2 TO 1 TABLET BY MOUTH TWICE A DAY AS NEEDED FOR ANXIETY 45 tablet 0   atorvastatin (LIPITOR) 40 MG tablet Take 1 tablet (40 mg total) by mouth daily. 90 tablet 1   FARXIGA 5 MG TABS tablet TAKE 1 TABLET (5 MG TOTAL) BY MOUTH DAILY. 90 tablet 0   fenofibrate 160 MG tablet TAKE 1 TABLET BY MOUTH EVERY DAY *TO REPLACE '134MG'$  DOSE 90 tablet 3   metFORMIN (GLUCOPHAGE) 500 MG tablet Take 1 tablet (500 mg total) by mouth daily with breakfast. Needs appointment for further refills. 180 tablet 0   pantoprazole (PROTONIX) 40 MG tablet TAKE 1 TABLET BY MOUTH EVERY DAY 90 tablet 0   No facility-administered medications prior to visit.   Allergies  Allergen Reactions   Peanut-Containing Drug Products Other (See Comments)    Pine nuts only.   Codeine Palpitations    Increases heart rate, rapid heart rate   Niacin Other (See Comments)    unknown   Penicillins Other (See Comments)    Does not know what reaction - happened as a child Was told not to take as a child.   Rosuvastatin Other (See Comments)    Night sweats     Objective:   Today's Vitals   10/25/21 1001  BP: 138/80  Pulse: 88  Temp: 98 F (36.7 C)  TempSrc: Temporal  SpO2: 96%  Weight: 168 lb 6.4 oz (76.4 kg)   Body mass index is 24.16 kg/m.   General: Well developed, well nourished. No acute distress. Feet- Skin intact. No sign of maceration between toes. Nails are normal. Dorsalis pedis and posterior tibial artery pulses  are normal. 5.07 monofilament testing normal. Psych: Alert and oriented. Normal mood and affect.  Health Maintenance Due  Topic Date Due   Hepatitis C Screening  Never done   Zoster Vaccines- Shingrix (1 of 2) Never done   OPHTHALMOLOGY EXAM  03/20/2021   HEMOGLOBIN A1C  06/12/2021   INFLUENZA VACCINE  10/16/2021     Assessment & Plan:   1. Type 2 diabetes mellitus without complication, without long-term current use  of insulin ([place]) We will check annual DM labs today. Mr. Coaxum is up to date on other screenings (thoguh we will reach out for the report from his eye doctor). We will continue his current regimen pending his A1c result.  - Microalbumin / creatinine urine ratio - Basic metabolic panel - Hemoglobin A1c - Urinalysis, Routine w reflex microscopic - dapagliflozin propanediol (FARXIGA) 5 MG TABS tablet; Take 1 tablet (5 mg total) by mouth daily.  Dispense: 90 tablet; Refill: 3 - metFORMIN (GLUCOPHAGE) 500 MG tablet; Take 1 tablet (500 mg total) by mouth daily with breakfast. Needs appointment for further refills.  Dispense: 180 tablet; Refill: 3  2. Essential hypertension Blood pressure remains mildly above goal. I will change up his Diovan HCT to a higher dose of HCTZ.  - valsartan-hydrochlorothiazide (DIOVAN HCT) 160-12.5 MG tablet; Take 1 tablet by mouth daily.  Dispense: 90 tablet; Refill: 3  3. Coronary artery disease involving native coronary artery of native heart without angina pectoris Stable. Continue aspirin and Plavix.  4. Mixed hyperlipidemia Due for repeat lipids to see if he remains at goal on atorvastatin and fenofibrate.  - Lipid panel - atorvastatin (LIPITOR) 40 MG tablet; Take 1 tablet (40 mg total) by mouth daily.  Dispense: 90 tablet; Refill: 3 - fenofibrate 160 MG tablet; Take 1 tablet (160 mg total) by mouth daily.  Dispense: 90 tablet; Refill: 3  5. Erectile dysfunction, unspecified erectile dysfunction type He plans to continue using the  sildenafil for now.  6. Gastroesophageal reflux disease, unspecified whether esophagitis present Stable on his PPI.  - pantoprazole (PROTONIX) 40 MG tablet; Take 1 tablet (40 mg total) by mouth daily.  Dispense: 90 tablet; Refill: 3  7. Anxiety Stable with occasional use of Xanax.  - ALPRAZolam (XANAX) 0.5 MG tablet; TAKE 1/2 TO 1 TABLET BY MOUTH TWICE A DAY AS NEEDED FOR ANXIETY  Dispense: 45 tablet; Refill: 0  8. Encounter for hepatitis C screening test for low risk patient  - HCV Ab w Reflex to Quant PCR   Return in about 3 months (around 01/25/2022) for Reassessment.   Haydee Salter, MD

## 2021-10-26 MED ORDER — EZETIMIBE 10 MG PO TABS: 10.0000 mg | ORAL_TABLET | Freq: Every day | ORAL | 3 refills | Status: DC

## 2021-10-26 NOTE — Addendum Note (Signed)
Addended by: Haydee Salter on: 10/26/2021 11:00 AM   Modules accepted: Orders

## 2021-10-27 LAB — HCV AB W REFLEX TO QUANT PCR: HCV Ab: NONREACTIVE

## 2021-10-27 LAB — HCV INTERPRETATION

## 2021-11-08 NOTE — Addendum Note (Signed)
Addended by: Haydee Salter on: 11/08/2021 03:14 PM   Modules accepted: Orders

## 2021-11-26 ENCOUNTER — Encounter: Payer: Self-pay | Admitting: Internal Medicine

## 2021-12-21 ENCOUNTER — Other Ambulatory Visit: Payer: Self-pay | Admitting: Family Medicine

## 2021-12-21 ENCOUNTER — Telehealth: Payer: Self-pay | Admitting: Family Medicine

## 2021-12-21 DIAGNOSIS — F419 Anxiety disorder, unspecified: Secondary | ICD-10-CM

## 2021-12-21 DIAGNOSIS — E119 Type 2 diabetes mellitus without complications: Secondary | ICD-10-CM

## 2021-12-21 MED ORDER — METFORMIN HCL 500 MG PO TABS: 500.0000 mg | ORAL_TABLET | Freq: Every day | ORAL | 1 refills | Status: DC

## 2021-12-21 NOTE — Telephone Encounter (Signed)
Rx  sent to pharmacy and patient notified that insurance may not cover.  Dm/cma

## 2021-12-21 NOTE — Telephone Encounter (Signed)
Metformin 

## 2021-12-21 NOTE — Telephone Encounter (Signed)
Caller Name: Jamarques Pinedo Call back phone #: [phone]  Reason for Call: Pt did not realize that his med went from twice a day to only once. He has been taking 2 a day and now only has 4 pills left. Can his prescription be sent in earlier? He does not have a problem with the change

## 2022-04-09 ENCOUNTER — Other Ambulatory Visit: Payer: Self-pay | Admitting: Family Medicine

## 2022-04-09 DIAGNOSIS — F419 Anxiety disorder, unspecified: Secondary | ICD-10-CM

## 2022-04-26 ENCOUNTER — Other Ambulatory Visit: Payer: Self-pay | Admitting: Family Medicine

## 2022-05-03 ENCOUNTER — Other Ambulatory Visit: Payer: Self-pay | Admitting: Interventional Cardiology

## 2022-06-05 ENCOUNTER — Encounter: Payer: Self-pay | Admitting: Family Medicine

## 2022-06-05 ENCOUNTER — Ambulatory Visit (INDEPENDENT_AMBULATORY_CARE_PROVIDER_SITE_OTHER): Payer: BC Managed Care – PPO | Admitting: Family Medicine

## 2022-06-05 VITALS — BP 118/2 | HR 78 | Temp 97.7°F | Ht 70.0 in | Wt 177.6 lb

## 2022-06-05 DIAGNOSIS — E782 Mixed hyperlipidemia: Secondary | ICD-10-CM

## 2022-06-05 DIAGNOSIS — R3911 Hesitancy of micturition: Secondary | ICD-10-CM

## 2022-06-05 DIAGNOSIS — N529 Male erectile dysfunction, unspecified: Secondary | ICD-10-CM | POA: Diagnosis not present

## 2022-06-05 DIAGNOSIS — Z0001 Encounter for general adult medical examination with abnormal findings: Secondary | ICD-10-CM

## 2022-06-05 DIAGNOSIS — I2510 Atherosclerotic heart disease of native coronary artery without angina pectoris: Secondary | ICD-10-CM

## 2022-06-05 DIAGNOSIS — E119 Type 2 diabetes mellitus without complications: Secondary | ICD-10-CM

## 2022-06-05 DIAGNOSIS — Z808 Family history of malignant neoplasm of other organs or systems: Secondary | ICD-10-CM

## 2022-06-05 DIAGNOSIS — I10 Essential (primary) hypertension: Secondary | ICD-10-CM | POA: Diagnosis not present

## 2022-06-05 LAB — LIPID PANEL
Cholesterol: 118 mg/dL (ref 0–200)
HDL: 39.3 mg/dL (ref >39.00)
NonHDL: 78.47
Total CHOL/HDL Ratio: 3
Triglycerides: 311 mg/dL — ABNORMAL HIGH (ref 0.0–149.0)
VLDL: 62.2 mg/dL — ABNORMAL HIGH (ref 0.0–40.0)

## 2022-06-05 LAB — HEMOGLOBIN A1C: Hgb A1c MFr Bld: 7.2 % — ABNORMAL HIGH (ref 4.6–6.5)

## 2022-06-05 LAB — GLUCOSE, RANDOM: Glucose, Bld: 83 mg/dL (ref 70–99)

## 2022-06-05 LAB — LDL CHOLESTEROL, DIRECT: Direct LDL: 50 mg/dL

## 2022-06-05 LAB — TESTOSTERONE: Testosterone: 564.29 ng/dL (ref 300.00–890.00)

## 2022-06-05 LAB — PSA: PSA: 0.67 ng/mL (ref 0.10–4.00)

## 2022-06-05 MED ORDER — SILDENAFIL CITRATE 50 MG PO TABS: 50.0000 mg | ORAL_TABLET | Freq: Every day | ORAL | 11 refills | Status: DC | PRN

## 2022-06-05 MED ORDER — METFORMIN HCL 500 MG PO TABS: 500.0000 mg | ORAL_TABLET | Freq: Every day | ORAL | 1 refills | Status: DC

## 2022-06-05 NOTE — Assessment & Plan Note (Signed)
Continue Viagra. I will check a testosterone level. We discussed options for management if low.

## 2022-06-05 NOTE — Assessment & Plan Note (Signed)
Blood pressure is in good control. Continue metoprolol succinate 50 mg daily and Diovan-HCT 160-12.5 mg daily.

## 2022-06-05 NOTE — Progress Notes (Signed)
[facility] [address] [place] [address] Dept: [phone] Dept Fax: [phone]  Annual Physical Visit  Subjective:    Patient ID: Migdalia Dk, male    DOB: 1965/04/04, 57 y.o..   MRN: [id number]  No chief complaint on file.  History of Present Illness:  Patient is in today for an annual physical/preventative visit.  Mr. Niu has a history of Type 2 diabetes. He had been managed on metformin 500 mg daily and dapagliflozin (farxiga) 5 mg daily.     Mr. Schake has a history of hypertension and has been managed on metoprolol succinate 50 mg daily and Diovan-HCT 160-12.5 mg daily.    Mr. Jewel has a history of CAD and is s/p inferior STEMI in 2016. He had two RCA stents placed. He is currently on metoprolol succinate 50 mg daily, aspirin 81 mg daily and clopidogrel 75 mg daily. Mr. Canipe has a history of hyperlipidemia and is managed on atorvastatin 40 mg daily, and fenofibrate 160 mg daily. He follows with [name].   Mr. Ellerman has a history of ED. He is managed on sildenafil (Viagra) 50 mg as needed. He has a concern for low testosterone.   Review of Systems  Constitutional:  Negative for chills, diaphoresis, fever, malaise/fatigue and weight loss.  HENT:  Negative for congestion, ear pain, hearing loss, sinus pain, sore throat and tinnitus.   Eyes:  Positive for blurred vision. Negative for pain, discharge and redness.       Has chronic vision loss of right eye due to old retinal detachment and scarring.  Respiratory:  Negative for cough, shortness of breath and wheezing.   Cardiovascular:  Negative for chest pain and palpitations.  Gastrointestinal:  Positive for diarrhea. Negative for abdominal pain, constipation, heartburn, nausea and vomiting.       Has chronic diarrhea since childhood. He believes this is IBS-D.  Musculoskeletal:  Negative for back pain, joint pain and myalgias.  Skin:  Negative for itching  and rash.       Notes family history of skin cancer. He asks about a dermatology referral to assess.  Psychiatric/Behavioral:  Negative for depression. The patient is not nervous/anxious.    Past Medical History: Patient Active Problem List   Diagnosis Date Noted   Ocular trauma of right eye 03/06/2020   History of inferior wall myocardial infarction 01/19/2015   Erectile dysfunction 01/19/2015   CAD (coronary artery disease), native coronary artery 01/19/2015   Type 2 diabetes mellitus without complication ([place]) 01/12/2535   Hepatic steatosis 06/13/2010   Retinal vascular occlusion 05/23/2010   History of colon polyps 11/15/2007   Peyronie's disease 11/15/2007   Hyperlipidemia 11/11/2007   Anxiety 11/11/2007   Essential hypertension 11/11/2007   GERD 11/11/2007   Past Surgical History:  Procedure Laterality Date   broken wrists     CARDIAC CATHETERIZATION N/A 11/29/2014   Procedure: Left Heart Cath and Coronary Angiography;  Surgeon: Jettie Booze, MD;  Location: [facility];  Service: Cardiovascular;  Laterality: N/A;   CARDIAC CATHETERIZATION N/A 11/29/2014   Procedure: Coronary Stent Intervention;  Surgeon: Jettie Booze, MD;  Location: [facility];  Service: Cardiovascular;  Laterality: N/A;   RETINAL DETACHMENT SURGERY     RHINOPLASTY     TONSILLECTOMY AND ADENOIDECTOMY     Family History  Problem Relation Age of Onset   Heart disease Father    Hyperlipidemia Father    Heart attack Maternal Grandfather  57   Heart attack Paternal Grandfather 60   Colon cancer Neg Hx    Pancreatic cancer Neg Hx    Stomach cancer Neg Hx    Hypertension Neg Hx    Stroke Neg Hx    Outpatient Medications Prior to Visit  Medication Sig Dispense Refill   ALPRAZolam (XANAX) 0.5 MG tablet TAKE 1/2 TO 1 TABLET BY MOUTH TWICE A DAY AS NEEDED FOR ANXIETY 45 tablet 1   Ascorbic Acid (VITAMIN C) 1000 MG tablet Take 1,000 mg by mouth daily.     aspirin 81 MG tablet Take  81 mg by mouth daily.      atorvastatin (LIPITOR) 40 MG tablet Take 1 tablet (40 mg total) by mouth daily. 90 tablet 3   Cinnamon 500 MG TABS Take 3 tablets by mouth daily.     clopidogrel (PLAVIX) 75 MG tablet TAKE 1 TABLET BY MOUTH EVERY DAY 90 tablet 2   dapagliflozin propanediol (FARXIGA) 5 MG TABS tablet Take 1 tablet (5 mg total) by mouth daily. 90 tablet 3   ezetimibe (ZETIA) 10 MG tablet Take 1 tablet (10 mg total) by mouth daily. 90 tablet 3   fenofibrate 160 MG tablet Take 1 tablet (160 mg total) by mouth daily. 90 tablet 3   magnesium oxide (MAG-OX) 400 MG tablet Take 400 mg by mouth daily.     metoprolol succinate (TOPROL-XL) 50 MG 24 hr tablet TAKE 1 TABLET BY MOUTH EVERY DAY WITH OR IMMEDIATELY FOLLOWING A MEAL 90 tablet 0   Multiple Vitamin (THERA) TABS Take 1 tablet by mouth daily.     nitroGLYCERIN (NITROSTAT) 0.4 MG SL tablet Place 1 tablet (0.4 mg total) under the tongue every 5 (five) minutes as needed for chest pain (X3 DOSES BEFORE CALLING 911). 25 tablet 3   Omega-3 Fatty Acids (FISH OIL) 1200 MG CAPS Take 2,400 mg by mouth daily.     pantoprazole (PROTONIX) 40 MG tablet Take 1 tablet (40 mg total) by mouth daily. 90 tablet 3   valsartan-hydrochlorothiazide (DIOVAN HCT) 160-12.5 MG tablet Take 1 tablet by mouth daily. 90 tablet 3   zinc gluconate 50 MG tablet Take 50 mg by mouth daily.     metFORMIN (GLUCOPHAGE) 500 MG tablet Take 1 tablet (500 mg total) by mouth daily with breakfast. Needs appointment for further refills. 180 tablet 1   sildenafil (VIAGRA) 50 MG tablet Take 50 mg by mouth daily as needed for erectile dysfunction.     No facility-administered medications prior to visit.   Allergies  Allergen Reactions   Peanut-Containing Drug Products Other (See Comments)    Pine nuts only.   Codeine Palpitations    Increases heart rate, rapid heart rate   Niacin Other (See Comments)    unknown   Penicillins Other (See Comments)    Does not know what reaction -  happened as a child Was told not to take as a child.   Rosuvastatin Other (See Comments)    Night sweats   Objective:   Today's Vitals   06/05/22 0917  BP: (!) 118/2  Pulse: 78  Temp: 97.7 F (36.5 C)  TempSrc: Temporal  SpO2: 98%  Weight: 177 lb 9.6 oz (80.6 kg)  Height: 5\' 10"  (1.778 m)   Body mass index is 25.48 kg/m.   General: Well developed, well nourished. No acute distress. HEENT: Normocephalic, non-traumatic. PERRL, EOMI. Conjunctiva clear. External ears normal.   EAC and TMs normal bilaterally. Nose clear without congestion or rhinorrhea. Mucous  membranes moist. Oropharynx clear. Good dentition. Neck: Supple. No lymphadenopathy. No thyromegaly. Lungs: Clear to auscultation bilaterally. No wheezing, rales or rhonchi. CV: RRR without murmurs or rubs. Pulses 2+ bilaterally. Abdomen: Soft, non-tender. Bowel sounds positive, normal pitch and frequency. No   hepatosplenomegaly. No rebound or guarding. Extremities: Full ROM. No joint swelling or tenderness. No edema noted. Skin: Warm and dry. No rashes. Psych: Alert and oriented. Normal mood and affect.    Assessment & Plan:   Problem List Items Addressed This Visit       Cardiovascular and Mediastinum   Essential hypertension    Blood pressure is in good control. Continue metoprolol succinate 50 mg daily and Diovan-HCT 160-12.5 mg daily.      Relevant Medications   sildenafil (VIAGRA) 50 MG tablet   CAD (coronary artery disease), native coronary artery    Stable. No angina. Continue metoprolol succinate 50 mg daily, aspirin 81 mg daily and clopidogrel 75 mg daily.      Relevant Medications   sildenafil (VIAGRA) 50 MG tablet   Other Relevant Orders   Lipid panel     Endocrine   Type 2 diabetes mellitus without complication (H[facility])    We will check an A1c today. Continue metformin 500 mg daily and dapagliflozin (farxiga) 5 mg daily.       Relevant Medications   metFORMIN (GLUCOPHAGE) 500 MG tablet    Other Relevant Orders   Glucose, random   Hemoglobin A1c     Other   Hyperlipidemia    Last lipids showed an LDL above goal. If this persists will consider a switch to rosuvastatin. Continue atorvastatin 40 mg daily for now.      Relevant Medications   sildenafil (VIAGRA) 50 MG tablet   Other Relevant Orders   Lipid panel   Erectile dysfunction    Continue Viagra. I will check a testosterone level. We discussed options for management if low.      Relevant Medications   sildenafil (VIAGRA) 50 MG tablet   Other Relevant Orders   Testosterone   Other Visit Diagnoses     Encounter for general adult medical examination with abnormal findings    -  Primary   Overall good health. We discussed screenings. I encourage him to engage in more regular exercise.   Urinary hesitancy       Relevant Orders   PSA   Family history of skin cancer       Relevant Orders   Ambulatory referral to Dermatology      Return in about 3 months (around 09/05/2022) for Reassessment.   Haydee Salter, MD

## 2022-06-05 NOTE — Assessment & Plan Note (Signed)
We will check an A1c today. Continue metformin 500 mg daily and dapagliflozin (farxiga) 5 mg daily.

## 2022-06-05 NOTE — Assessment & Plan Note (Signed)
Last lipids showed an LDL above goal. If this persists will consider a switch to rosuvastatin. Continue atorvastatin 40 mg daily for now.

## 2022-06-05 NOTE — Assessment & Plan Note (Signed)
Stable. No angina. Continue metoprolol succinate 50 mg daily, aspirin 81 mg daily and clopidogrel 75 mg daily.

## 2022-06-10 NOTE — Addendum Note (Signed)
Addended by: Haydee Salter on: 06/10/2022 09:38 AM   Modules accepted: Level of Service

## 2022-06-23 NOTE — Progress Notes (Unsigned)
Cardiology Office Note   Date:  06/24/2022   ID:  Carlos CardinalStephen A Fadely, DOB Dec 15, 1965, MRN [id number]  PCP:  Loyola Mastudd, Luisdavid M, MD    No chief complaint on file.  CAD  Wt Readings from Last 3 Encounters:  06/24/22 176 lb 3.2 oz (79.9 kg)  06/05/22 177 lb 9.6 oz (80.6 kg)  10/25/21 168 lb 6.4 oz (76.4 kg)       History of Present Illness: Carlos Montgomery is a 57 y.o. male  Who had inferior STEMI in 9/16.  He thinks the DOE was building prior to this.  He received 2 stents to the RCA.  He did well with the procedure.  Angina was terrible heartburn feeling.     Has been on DAPT due to bifurcation stenting at the time of STEMI.    In 02/2018: "Has some stress in his life with a divorce. He has used all of his Xanax for the month. "   Of note, his son is a Teacher, English as a foreign languagegolfer and is on scholarship at a college in [place].   Had dizziness in 10/22. Stress test was negative. Happened after meds were changed.   Issues with enlarged prostate.     Past Medical History:  Diagnosis Date   CAD (coronary artery disease)    cath 11/29/2014 bifurcation Culotte stent to distal RCA and ost PDA treated with 2.75x28 mm Synergy stent, and ost PLA with 2.5x1416mm DES, 90% small OM2   Diabetes mellitus    metformin   Hyperlipemia    Hypertension    Pulmonary nodule 09/06/2010   Tobacco abuse 11/29/2014    Past Surgical History:  Procedure Laterality Date   broken wrists     CARDIAC CATHETERIZATION N/A 11/29/2014   Procedure: Left Heart Cath and Coronary Angiography;  Surgeon: Corky CraftsJayadeep S Desiree Daise, MD;  [facility] LAB;  Service: Cardiovascular;  Laterality: N/A;   CARDIAC CATHETERIZATION N/A 11/29/2014   Procedure: Coronary Stent Intervention;  Surgeon: Corky CraftsJayadeep S Zadia Uhde, MD;  Location[facility] CV LAB;  Service: Cardiovascular;  Laterality: N/A;   RETINAL DETACHMENT SURGERY     RHINOPLASTY     TONSILLECTOMY AND ADENOIDECTOMY       Current Outpatient Medications  Medication Sig  Dispense Refill   ALPRAZolam (XANAX) 0.5 MG tablet TAKE 1/2 TO 1 TABLET BY MOUTH TWICE A DAY AS NEEDED FOR ANXIETY 45 tablet 1   Ascorbic Acid (VITAMIN C) 1000 MG tablet Take 1,000 mg by mouth daily.     aspirin 81 MG tablet Take 81 mg by mouth daily.      atorvastatin (LIPITOR) 40 MG tablet Take 1 tablet (40 mg total) by mouth daily. 90 tablet 3   Cinnamon 500 MG TABS Take 3 tablets by mouth daily.     clopidogrel (PLAVIX) 75 MG tablet TAKE 1 TABLET BY MOUTH EVERY DAY 90 tablet 2   dapagliflozin propanediol (FARXIGA) 5 MG TABS tablet Take 1 tablet (5 mg total) by mouth daily. 90 tablet 3   ezetimibe (ZETIA) 10 MG tablet Take 1 tablet (10 mg total) by mouth daily. 90 tablet 3   fenofibrate 160 MG tablet Take 1 tablet (160 mg total) by mouth daily. 90 tablet 3   magnesium oxide (MAG-OX) 400 MG tablet Take 400 mg by mouth daily.     metFORMIN (GLUCOPHAGE) 500 MG tablet Take 1 tablet (500 mg total) by mouth daily with breakfast. Needs appointment for further refills. 180 tablet 1   metoprolol succinate (TOPROL-XL) 50  MG 24 hr tablet TAKE 1 TABLET BY MOUTH EVERY DAY WITH OR IMMEDIATELY FOLLOWING A MEAL 90 tablet 0   Multiple Vitamin (THERA) TABS Take 1 tablet by mouth daily.     nitroGLYCERIN (NITROSTAT) 0.4 MG SL tablet Place 1 tablet (0.4 mg total) under the tongue every 5 (five) minutes as needed for chest pain (X3 DOSES BEFORE CALLING 911). 25 tablet 3   Omega-3 Fatty Acids (FISH OIL) 1200 MG CAPS Take 2,400 mg by mouth daily.     pantoprazole (PROTONIX) 40 MG tablet Take 1 tablet (40 mg total) by mouth daily. 90 tablet 3   sildenafil (VIAGRA) 50 MG tablet Take 1 tablet (50 mg total) by mouth daily as needed for erectile dysfunction. 10 tablet 11   valsartan-hydrochlorothiazide (DIOVAN HCT) 160-12.5 MG tablet Take 1 tablet by mouth daily. 90 tablet 3   zinc gluconate 50 MG tablet Take 50 mg by mouth daily.     No current facility-administered medications for this visit.    Allergies:    Peanut-containing drug products, Codeine, Niacin, Penicillins, and Rosuvastatin    Social History:  The patient  reports that he quit smoking about 24 years ago. His smoking use included cigarettes. He has never used smokeless tobacco. He reports that he does not drink alcohol and does not use drugs.   Family History:  The patient's family history includes Heart attack (age of onset: 26) in his paternal grandfather; Heart attack (age of onset: 47) in his maternal grandfather; Heart disease in his father; Hyperlipidemia in his father.    ROS:  Please see the history of present illness.   Otherwise, review of systems are positive for GI side effects.   All other systems are reviewed and negative.    PHYSICAL EXAM: VS:  BP 128/70   Pulse 88   Ht 5\' 10"  (1.778 m)   Wt 176 lb 3.2 oz (79.9 kg)   SpO2 98%   BMI 25.28 kg/m  , BMI Body mass index is 25.28 kg/m. GEN: Well nourished, well developed, in no acute distress HEENT: normal Neck: no JVD, carotid bruits, or masses Cardiac: RRR; no murmurs, rubs, or gallops,no edema  Respiratory:  clear to auscultation bilaterally, normal work of breathing GI: soft, nontender, nondistended, + BS MS: no deformity or atrophy Skin: warm and dry, no rash Neuro:  Strength and sensation are intact Psych: euthymic mood, full affect   EKG:   The ekg ordered today demonstrates NSR, nonspecific ST changes   Recent Labs: 10/25/2021: BUN 19; Creatinine, Ser 1.43; Potassium 4.2; Sodium 135   Lipid Panel    Component Value Date/Time   CHOL 118 06/05/2022 1034   CHOL 116 03/17/2019 1640   TRIG 311.0 (H) 06/05/2022 1034   HDL 39.30 06/05/2022 1034   HDL 37 (L) 03/17/2019 1640   CHOLHDL 3 06/05/2022 1034   VLDL 62.2 (H) 06/05/2022 1034   LDLCALC 27 07/14/2020 1427   LDLCALC 59 03/17/2019 1640   LDLDIRECT 50.0 06/05/2022 1034     Other studies Reviewed: Additional studies/ records that were reviewed today with results demonstrating: labs  reviewed.   ASSESSMENT AND PLAN:  CAD/old MI: Clopidogrel monotherapy has been continued as he has culotte bifurcation stents placed in the setting of STEMI.  No angina.  Needs refill for SL NTG. Walks more when the weather is better.  He has not been walking of late.  Hyperlipidemia: Whole food, plant-based diet.  High-fiber diet.  Avoid processed foods.   Hypertension: Low-salt diet.  The current medical regimen is effective;  continue present plan and medications. Diabetes: Exercise target noted below.  Include resistance training 2 days a week.  March 2024 A1 C 7.2.  Consider metformin extended release to reduce GI side effects.   Current medicines are reviewed at length with the patient today.  The patient concerns regarding his medicines were addressed.  The following changes have been made:  No change  Labs/ tests ordered today include:  No orders of the defined types were placed in this encounter.   Recommend 150 minutes/week of aerobic exercise Low fat, low carb, high fiber diet recommended  Disposition:   FU in 1 year   Signed, Lance Muss, MD  06/24/2022 4:20 PM    [facility] [address] [place], [place], [place]  [address] Phone: [phone]; Fax: [phone]

## 2022-06-24 ENCOUNTER — Ambulatory Visit: Payer: BC Managed Care – PPO | Attending: Interventional Cardiology | Admitting: Interventional Cardiology

## 2022-06-24 ENCOUNTER — Encounter: Payer: Self-pay | Admitting: Interventional Cardiology

## 2022-06-24 VITALS — BP 128/70 | HR 88 | Ht 70.0 in | Wt 176.2 lb

## 2022-06-24 DIAGNOSIS — I10 Essential (primary) hypertension: Secondary | ICD-10-CM | POA: Diagnosis not present

## 2022-06-24 DIAGNOSIS — I25118 Atherosclerotic heart disease of native coronary artery with other forms of angina pectoris: Secondary | ICD-10-CM

## 2022-06-24 DIAGNOSIS — I252 Old myocardial infarction: Secondary | ICD-10-CM | POA: Diagnosis not present

## 2022-06-24 DIAGNOSIS — E782 Mixed hyperlipidemia: Secondary | ICD-10-CM | POA: Diagnosis not present

## 2022-06-24 DIAGNOSIS — I2510 Atherosclerotic heart disease of native coronary artery without angina pectoris: Secondary | ICD-10-CM

## 2022-06-24 MED ORDER — NITROGLYCERIN 0.4 MG SL SUBL: 0.4000 mg | SUBLINGUAL_TABLET | SUBLINGUAL | 6 refills | Status: AC | PRN

## 2022-06-24 NOTE — Patient Instructions (Signed)
Medication Instructions:  Your physician recommends that you continue on your current medications as directed. Please refer to the Current Medication list given to you today.  *If you need a refill on your cardiac medications before your next appointment, please call your pharmacy*   Lab Work: none If you have labs (blood work) drawn today and your tests are completely normal, you will receive your results only by: MyChart Message (if you have MyChart) OR A paper copy in the mail If you have any lab test that is abnormal or we need to change your treatment, we will call you to review the results.   Testing/Procedures: none   Follow-Up: At [facility], you and your health needs are our priority.  As part of our continuing mission to provide you with exceptional heart care, we have created designated Provider Care Teams.  These Care Teams include your primary Cardiologist (physician) and Advanced Practice Providers (APPs -  Physician Assistants and Nurse Practitioners) who all work together to provide you with the care you need, when you need it.  We recommend signing up for the patient portal called "MyChart".  Sign up information is provided on this After Visit Summary.  MyChart is used to connect with patients for Virtual Visits (Telemedicine).  Patients are able to view lab/test results, encounter notes, upcoming appointments, etc.  Non-urgent messages can be sent to your provider as well.   To learn more about what you can do with MyChart, go to [email or web address].    Your next appointment:   12 month(s)  Provider:   Jayadeep Varanasi, MD     Other Instructions    

## 2022-07-15 ENCOUNTER — Other Ambulatory Visit: Payer: Self-pay | Admitting: Family Medicine

## 2022-07-15 DIAGNOSIS — F419 Anxiety disorder, unspecified: Secondary | ICD-10-CM

## 2022-08-07 ENCOUNTER — Other Ambulatory Visit: Payer: Self-pay | Admitting: Family Medicine

## 2022-08-24 ENCOUNTER — Other Ambulatory Visit: Payer: Self-pay | Admitting: Family Medicine

## 2022-08-24 DIAGNOSIS — E119 Type 2 diabetes mellitus without complications: Secondary | ICD-10-CM

## 2022-09-04 ENCOUNTER — Ambulatory Visit: Payer: BC Managed Care – PPO | Admitting: Family Medicine

## 2022-09-04 ENCOUNTER — Encounter: Payer: Self-pay | Admitting: Family Medicine

## 2022-09-04 VITALS — BP 132/84 | HR 76 | Temp 97.7°F | Wt 178.2 lb

## 2022-09-04 DIAGNOSIS — R3911 Hesitancy of micturition: Secondary | ICD-10-CM | POA: Diagnosis not present

## 2022-09-04 DIAGNOSIS — E782 Mixed hyperlipidemia: Secondary | ICD-10-CM | POA: Diagnosis not present

## 2022-09-04 DIAGNOSIS — I10 Essential (primary) hypertension: Secondary | ICD-10-CM

## 2022-09-04 DIAGNOSIS — I2510 Atherosclerotic heart disease of native coronary artery without angina pectoris: Secondary | ICD-10-CM

## 2022-09-04 DIAGNOSIS — E119 Type 2 diabetes mellitus without complications: Secondary | ICD-10-CM

## 2022-09-04 DIAGNOSIS — Z7984 Long term (current) use of oral hypoglycemic drugs: Secondary | ICD-10-CM

## 2022-09-04 LAB — GLUCOSE, RANDOM: Glucose, Bld: 98 mg/dL (ref 70–99)

## 2022-09-04 LAB — HEMOGLOBIN A1C: Hgb A1c MFr Bld: 7.2 % — ABNORMAL HIGH (ref 4.6–6.5)

## 2022-09-04 NOTE — Assessment & Plan Note (Signed)
Stable. No angina. Continue metoprolol succinate 50 mg daily, aspirin 81 mg daily and clopidogrel 75 mg daily. 

## 2022-09-04 NOTE — Assessment & Plan Note (Signed)
We will check an A1c today. This had been creeping up at his last appointment. Continue metformin 500 mg daily and dapagliflozin (farxiga) 5 mg daily.  If A1c is continuing to increase, I will likely increase his dapagliflozin to 10 mg daily.

## 2022-09-04 NOTE — Assessment & Plan Note (Signed)
Lipids at goal. Continue atorvastatin 40 mg daily.

## 2022-09-04 NOTE — Assessment & Plan Note (Signed)
Blood pressure is in adequate control. Continue metoprolol succinate 50 mg daily and Diovan-HCT 160-12.5 mg daily.

## 2022-09-04 NOTE — Progress Notes (Signed)
[facility] [address] [place] [address] Dept: [phone] Dept Fax: [phone]  Chronic Care Office Visit  Subjective:    Patient ID: Carlos Montgomery, male    DOB: September 08, 1965, 57 y.o..   MRN: [id number]  Chief Complaint  Patient presents with   Medical Management of Chronic Issues    fasting   History of Present Illness:  Patient is in today for reassessment of chronic medical issues.  Carlos Montgomery has a history of Type 2 diabetes. He had been managed on metformin 500 mg daily and dapagliflozin (farxiga) 5 mg daily.     Carlos Montgomery has a history of hypertension and has been managed on metoprolol succinate 50 mg daily and Diovan-HCT 160-12.5 mg daily.    Carlos Montgomery has a history of CAD and is s/p inferior STEMI in 2016. He had two RCA stents placed. He is currently on metoprolol succinate 50 mg daily, aspirin 81 mg daily and clopidogrel 75 mg daily. Carlos Montgomery has a history of hyperlipidemia and is managed on atorvastatin 40 mg daily, and fenofibrate 160 mg daily. He follows with Dr. Eldridge Dace.   Carlos Montgomery had previously discussed with me about an issue with urination. He finds that when he sits on the toilet, he is unable to fully empty his bladder. He ends up needing to stand to void. He does get up several times a night to urinate, but has felt this is due to his dapagliflozin use.  Past Medical History: Patient Active Problem List   Diagnosis Date Noted   Ocular trauma of right eye 03/06/2020   History of inferior wall myocardial infarction 01/19/2015   Erectile dysfunction 01/19/2015   CAD (coronary artery disease), native coronary artery 01/19/2015   Type 2 diabetes mellitus without complication (HCC) 12/16/2013   Hepatic steatosis 06/13/2010   Retinal vascular occlusion 05/23/2010   History of colon polyps 11/15/2007   Peyronie's disease 11/15/2007   Hyperlipidemia 11/11/2007   Anxiety 11/11/2007   Essential  hypertension 11/11/2007   GERD 11/11/2007   Past Surgical History:  Procedure Laterality Date   broken wrists     CARDIAC CATHETERIZATION N/A 11/29/2014   Procedure: Left Heart Cath and Coronary Angiography;  Surgeon: Corky Crafts, MD;  Location: [facility] INVASIVE CV LAB;  Service: Cardiovascular;  Laterality: N/A;   CARDIAC CATHETERIZATION N/A 11/29/2014   Procedure: Coronary Stent Intervention;  Surgeon: Corky Crafts, MD;  Location[facility] INVASIVE CV LAB;  Service: Cardiovascular;  Laterality: N/A;   RETINAL DETACHMENT SURGERY     RHINOPLASTY     TONSILLECTOMY AND ADENOIDECTOMY     Family History  Problem Relation Age of Onset   Heart disease Father    Hyperlipidemia Father    Heart attack Maternal Grandfather 51   Heart attack Paternal Grandfather 5   Colon cancer Neg Hx    Pancreatic cancer Neg Hx    Stomach cancer Neg Hx    Hypertension Neg Hx    Stroke Neg Hx    Outpatient Medications Prior to Visit  Medication Sig Dispense Refill   ALPRAZolam (XANAX) 0.5 MG tablet TAKE 1/2 TO 1 TABLET BY MOUTH TWICE A DAY AS NEEDED FOR ANXIETY 45 tablet 1   Ascorbic Acid (VITAMIN C) 1000 MG tablet Take 1,000 mg by mouth daily.     aspirin 81 MG tablet Take 81 mg by mouth daily.      atorvastatin (LIPITOR) 40 MG tablet Take 1 tablet (40 mg total) by  mouth daily. 90 tablet 3   Cinnamon 500 MG TABS Take 3 tablets by mouth daily.     clopidogrel (PLAVIX) 75 MG tablet TAKE 1 TABLET BY MOUTH EVERY DAY 90 tablet 2   ezetimibe (ZETIA) 10 MG tablet Take 1 tablet (10 mg total) by mouth daily. 90 tablet 3   FARXIGA 5 MG TABS tablet TAKE 1 TABLET (5 MG TOTAL) BY MOUTH DAILY. 90 tablet 3   fenofibrate 160 MG tablet Take 1 tablet (160 mg total) by mouth daily. 90 tablet 3   magnesium oxide (MAG-OX) 400 MG tablet Take 400 mg by mouth daily.     metFORMIN (GLUCOPHAGE) 500 MG tablet Take 1 tablet (500 mg total) by mouth daily with breakfast. Needs appointment for further refills. 180 tablet 1    metoprolol succinate (TOPROL-XL) 50 MG 24 hr tablet TAKE 1 TABLET BY MOUTH EVERY DAY WITH OR IMMEDIATELY FOLLOWING A MEAL 90 tablet 3   Multiple Vitamin (THERA) TABS Take 1 tablet by mouth daily.     nitroGLYCERIN (NITROSTAT) 0.4 MG SL tablet Place 1 tablet (0.4 mg total) under the tongue every 5 (five) minutes as needed for chest pain (X3 DOSES BEFORE CALLING 911). 25 tablet 6   Omega-3 Fatty Acids (FISH OIL) 1200 MG CAPS Take 2,400 mg by mouth daily.     pantoprazole (PROTONIX) 40 MG tablet Take 1 tablet (40 mg total) by mouth daily. 90 tablet 3   sildenafil (VIAGRA) 50 MG tablet Take 1 tablet (50 mg total) by mouth daily as needed for erectile dysfunction. 10 tablet 11   valsartan-hydrochlorothiazide (DIOVAN HCT) 160-12.5 MG tablet Take 1 tablet by mouth daily. 90 tablet 3   zinc gluconate 50 MG tablet Take 50 mg by mouth daily.     No facility-administered medications prior to visit.   Allergies  Allergen Reactions   Peanut-Containing Drug Products Other (See Comments)    Pine nuts only.   Codeine Palpitations    Increases heart rate, rapid heart rate   Niacin Other (See Comments)    unknown   Penicillins Other (See Comments)    Does not know what reaction - happened as a child Was told not to take as a child.   Rosuvastatin Other (See Comments)    Night sweats   Objective:   Today's Vitals   09/04/22 0810  BP: 132/84  Pulse: 76  Temp: 97.7 F (36.5 C)  TempSrc: Temporal  SpO2: 99%  Weight: 178 lb 3.2 oz (80.8 kg)   Body mass index is 25.57 kg/m.   General: Well developed, well nourished. No acute distress. Psych: Alert and oriented. Normal mood and affect.  Health Maintenance Due  Topic Date Due   Zoster Vaccines- Shingrix (1 of 2) Never done   OPHTHALMOLOGY EXAM  07/03/2022   Lab Results Last lipids Lab Results  Component Value Date   CHOL 118 06/05/2022   HDL 39.30 06/05/2022   LDLCALC 27 07/14/2020   LDLDIRECT 50.0 06/05/2022   TRIG 311.0 (H) 06/05/2022    CHOLHDL 3 06/05/2022   Last hemoglobin A1c Lab Results  Component Value Date   HGBA1C 7.2 (H) 06/05/2022   Lab Results  Component Value Date   PSA 0.67 06/05/2022   PSA 0.39 03/06/2020   PSA 0.65 05/28/2017   Assessment & Plan:   Problem List Items Addressed This Visit       Cardiovascular and Mediastinum   Essential hypertension - Primary    Blood pressure is in adequate control. Continue  metoprolol succinate 50 mg daily and Diovan-HCT 160-12.5 mg daily.      CAD (coronary artery disease), native coronary artery    Stable. No angina. Continue metoprolol succinate 50 mg daily, aspirin 81 mg daily and clopidogrel 75 mg daily.        Endocrine   Type 2 diabetes mellitus without complication (HCC)    We will check an A1c today. This had been creeping up at his last appointment. Continue metformin 500 mg daily and dapagliflozin (farxiga) 5 mg daily.  If A1c is continuing to increase, I will likely increase his dapagliflozin to 10 mg daily.      Relevant Orders   Glucose, random   Hemoglobin A1c     Other   Hyperlipidemia    Lipids at goal. Continue atorvastatin 40 mg daily.      Other Visit Diagnoses     Urinary hesitancy       It is unclear if this is BPH or a medicaiton effect. Patient requests to see urology. He was previously a patient of Dr. Marcello Fennel.   Relevant Orders   Ambulatory referral to Urology       Return in about 3 months (around 12/05/2022).   Loyola Mast, MD

## 2022-09-26 ENCOUNTER — Encounter: Payer: Self-pay | Admitting: Dermatology

## 2022-09-26 ENCOUNTER — Ambulatory Visit (INDEPENDENT_AMBULATORY_CARE_PROVIDER_SITE_OTHER): Payer: BC Managed Care – PPO | Admitting: Dermatology

## 2022-09-26 VITALS — BP 134/86 | HR 86

## 2022-09-26 DIAGNOSIS — Z1283 Encounter for screening for malignant neoplasm of skin: Secondary | ICD-10-CM

## 2022-09-26 DIAGNOSIS — D1801 Hemangioma of skin and subcutaneous tissue: Secondary | ICD-10-CM

## 2022-09-26 DIAGNOSIS — W908XXA Exposure to other nonionizing radiation, initial encounter: Secondary | ICD-10-CM

## 2022-09-26 DIAGNOSIS — L814 Other melanin hyperpigmentation: Secondary | ICD-10-CM | POA: Diagnosis not present

## 2022-09-26 DIAGNOSIS — L821 Other seborrheic keratosis: Secondary | ICD-10-CM

## 2022-09-26 DIAGNOSIS — Z808 Family history of malignant neoplasm of other organs or systems: Secondary | ICD-10-CM

## 2022-09-26 DIAGNOSIS — L578 Other skin changes due to chronic exposure to nonionizing radiation: Secondary | ICD-10-CM

## 2022-09-26 NOTE — Patient Instructions (Addendum)
Thank you for visiting my office today. I appreciate your commitment to maintaining your skin health and taking proactive steps to ensure any concerns are addressed promptly.  Here is a summary of the key points from our consultation:  - Skin Examination: We discussed the importance of regular skin checks to monitor for any changes, especially given your history and exposure to the sun.  - Diagnosis: Identified a benign growth known as seborrheic keratosis, which has no malignant potential. Also noted a dermatofibroma on your left lateral upper calf and a verrucous papule on your left lower leg medial.  - Treatment Options: We can freeze the wart if it becomes bothersome, though it is currently not a concern unless it changes.  - Sun Protection: It's great to hear you are diligent with sunscreen. Continue practicing good sun protection habits to minimize skin damage.  - Annual Check-ups: Recommended annual skin examinations to catch any early signs of skin cancer, considering past sun exposure and current skin conditions.  Please feel free to reach out if you have any further questions or concerns. Looking forward to seeing you at your next scheduled appointment.      Due to recent changes in healthcare laws, you may see results of your pathology and/or laboratory studies on MyChart before the doctors have had a chance to review them. We understand that in some cases there may be results that are confusing or concerning to you. Please understand that not all results are received at the same time and often the doctors may need to interpret multiple results in order to provide you with the best plan of care or course of treatment. Therefore, we ask that you please give [place] 2 business days to thoroughly review all your results before contacting the office for clarification. Should we see a critical lab result, you will be contacted sooner.   If You Need Anything After Your Visit  If you have any  questions or concerns for your doctor, please call our main line at [phone] If no one answers, please leave a voicemail as directed and we will return your call as soon as possible. Messages left after 4 pm will be answered the following business day.   You may also send [place] a message via MyChart. We typically respond to MyChart messages within 1-2 business days.  For prescription refills, please ask your pharmacy to contact our office. Our fax number is [phone].  If you have an urgent issue when the clinic is closed that cannot wait until the next business day, you can page your doctor at the number below.    Please note that while we do our best to be available for urgent issues outside of office hours, we are not available 24/7.   If you have an urgent issue and are unable to reach [place], you may choose to seek medical care at your doctor's office, retail clinic, urgent care center, or emergency room.  If you have a medical emergency, please immediately call 911 or go to the emergency department. In the event of inclement weather, please call our main line at [phone] for an update on the status of any delays or closures.  Dermatology Medication Tips: Please keep the boxes that topical medications come in in order to help keep track of the instructions about where and how to use these. Pharmacies typically print the medication instructions only on the boxes and not directly on the medication tubes.   If your medication is too expensive,  please contact our office at [phone] or send [place] a message through MyChart.   We are unable to tell what your co-pay for medications will be in advance as this is different depending on your insurance coverage. However, we may be able to find a substitute medication at lower cost or fill out paperwork to get insurance to cover a needed medication.   If a prior authorization is required to get your medication covered by your insurance company,  please allow [place] 1-2 business days to complete this process.  Drug prices often vary depending on where the prescription is filled and some pharmacies may offer cheaper prices.  The website www.goodrx.com contains coupons for medications through different pharmacies. The prices here do not account for what the cost may be with help from insurance (it may be cheaper with your insurance), but the website can give you the price if you did not use any insurance.  - You can print the associated coupon and take it with your prescription to the pharmacy.  - You may also stop by our office during regular business hours and pick up a GoodRx coupon card.  - If you need your prescription sent electronically to a different pharmacy, notify our office through [facility] or by phone at [phone]

## 2022-09-26 NOTE — Progress Notes (Signed)
   New Patient Visit   Subjective  Carlos Montgomery is a 57 y.o. male who presents for the following: TBSE  Patient states he is here to establish care and for FBSE. Patient denies Hx of bx. Patient reports family history of skin cancer(s)(Grandfather).  Patient reports throughout his lifetime, moderate sun exposure. Currently, he does use sunscreen and wear protective clothing.   The following portions of the chart were reviewed this encounter and updated as appropriate: medications, allergies, medical history  Review of Systems:  No other skin or systemic complaints except as noted in HPI or Assessment and Plan.  Objective  Well appearing patient in no apparent distress; mood and affect are within normal limits.  A full examination was performed including scalp, head, eyes, ears, nose, lips, neck, chest, axillae, abdomen, back, buttocks, bilateral upper extremities, bilateral lower extremities, hands, feet, fingers, toes, fingernails, and toenails. All findings within normal limits unless otherwise noted below.    Relevant exam findings are noted in the Assessment and Plan.  Left Lateral Firm Brown Papule LLE Pink flat top Papule  Assessment & Plan   LENTIGINES, SEBORRHEIC KERATOSES, HEMANGIOMAS - Benign normal skin lesions - Benign-appearing - Call for any changes  MELANOCYTIC NEVI - Tan-brown and/or pink-flesh-colored symmetric macules and papules - Benign appearing on exam today - Observation - Call clinic for new or changing moles - Recommend daily use of broad spectrum spf 30+ sunscreen to sun-exposed areas.   ACTINIC DAMAGE - Chronic condition, secondary to cumulative UV/sun exposure - diffuse scaly erythematous macules with underlying dyspigmentation - Recommend daily broad spectrum sunscreen SPF 30+ to sun-exposed areas, reapply every 2 hours as needed.  - Staying in the shade or wearing long sleeves, sun glasses (UVA+UVB protection) and wide brim hats (4-inch brim  around the entire circumference of the hat) are also recommended for sun protection.  - Call for new or changing lesions.  SKIN CANCER SCREENING PERFORMED TODAY  Return in about 1 year (around 09/26/2023) for TBSE.  Documentation: I have reviewed the above documentation for accuracy and completeness, and I agree with the above.  Stasia Cavalier, am acting as scribe for Langston Reusing, DO.  Langston Reusing, DO

## 2022-10-17 ENCOUNTER — Other Ambulatory Visit: Payer: Self-pay | Admitting: Family Medicine

## 2022-10-17 DIAGNOSIS — E782 Mixed hyperlipidemia: Secondary | ICD-10-CM

## 2022-10-19 ENCOUNTER — Other Ambulatory Visit: Payer: Self-pay | Admitting: Family Medicine

## 2022-10-19 DIAGNOSIS — F419 Anxiety disorder, unspecified: Secondary | ICD-10-CM

## 2022-10-20 ENCOUNTER — Other Ambulatory Visit: Payer: Self-pay | Admitting: Family Medicine

## 2022-10-20 DIAGNOSIS — K219 Gastro-esophageal reflux disease without esophagitis: Secondary | ICD-10-CM

## 2022-10-20 DIAGNOSIS — I10 Essential (primary) hypertension: Secondary | ICD-10-CM

## 2022-10-20 DIAGNOSIS — E782 Mixed hyperlipidemia: Secondary | ICD-10-CM

## 2022-11-14 ENCOUNTER — Telehealth: Payer: Self-pay | Admitting: Family Medicine

## 2022-11-14 NOTE — Telephone Encounter (Signed)
Carlos Montgomery [phone]    Pt stated, with a laugh, that the referral to Urology with Dr Patsi Sears is in [place] and he would like something closer.

## 2022-11-15 NOTE — Telephone Encounter (Signed)
Patient notified VIA phone and number given to him to urology office.  Dm/cma

## 2022-11-20 ENCOUNTER — Ambulatory Visit: Payer: BC Managed Care – PPO | Admitting: Family Medicine

## 2022-11-20 ENCOUNTER — Encounter: Payer: Self-pay | Admitting: Family Medicine

## 2022-11-20 VITALS — BP 120/84 | HR 79 | Temp 98.0°F | Ht 70.0 in | Wt 176.0 lb

## 2022-11-20 DIAGNOSIS — E119 Type 2 diabetes mellitus without complications: Secondary | ICD-10-CM | POA: Diagnosis not present

## 2022-11-20 DIAGNOSIS — Z7984 Long term (current) use of oral hypoglycemic drugs: Secondary | ICD-10-CM

## 2022-11-20 DIAGNOSIS — Z1211 Encounter for screening for malignant neoplasm of colon: Secondary | ICD-10-CM

## 2022-11-20 DIAGNOSIS — I2510 Atherosclerotic heart disease of native coronary artery without angina pectoris: Secondary | ICD-10-CM | POA: Diagnosis not present

## 2022-11-20 DIAGNOSIS — E782 Mixed hyperlipidemia: Secondary | ICD-10-CM

## 2022-11-20 DIAGNOSIS — I10 Essential (primary) hypertension: Secondary | ICD-10-CM | POA: Diagnosis not present

## 2022-11-20 LAB — BASIC METABOLIC PANEL
BUN: 20 mg/dL (ref 6–23)
CO2: 27 meq/L (ref 19–32)
Calcium: 9.7 mg/dL (ref 8.4–10.5)
Chloride: 93 meq/L — ABNORMAL LOW (ref 96–112)
Creatinine, Ser: 1.52 mg/dL — ABNORMAL HIGH (ref 0.40–1.50)
GFR: 51.38 mL/min — ABNORMAL LOW (ref >60.00)
Glucose, Bld: 88 mg/dL (ref 70–99)
Potassium: 4.7 meq/L (ref 3.5–5.1)
Sodium: 131 meq/L — ABNORMAL LOW (ref 135–145)

## 2022-11-20 LAB — HEMOGLOBIN A1C: Hgb A1c MFr Bld: 7 % — ABNORMAL HIGH (ref 4.6–6.5)

## 2022-11-20 LAB — MICROALBUMIN / CREATININE URINE RATIO
Creatinine,U: 80.6 mg/dL
Microalb Creat Ratio: 7.9 mg/g (ref 0.0–30.0)
Microalb, Ur: 6.4 mg/dL — ABNORMAL HIGH (ref 0.0–1.9)

## 2022-11-20 NOTE — Assessment & Plan Note (Addendum)
We will check an A1c and annual renal labs today. This has been stable at 7.2% over the past 6 months. Continue metformin 500 mg daily and dapagliflozin (Farxiga) 5 mg daily.

## 2022-11-20 NOTE — Assessment & Plan Note (Signed)
Lipids at goal. Continue atorvastatin 40 mg daily.

## 2022-11-20 NOTE — Progress Notes (Signed)
[facility] [address] [place] [address] Dept: [phone] Dept Fax: [phone]  Chronic Care Office Visit  Subjective:    Patient ID: Carlos Montgomery, male    DOB: October 25, 1965, 57 y.o..   MRN: [id number]  Chief Complaint  Patient presents with   Diabetes    3 month f/u.     History of Present Illness:  Patient is in today for reassessment of chronic medical issues.  Carlos Montgomery has a history of Type 2 diabetes. He had been managed on metformin 500 mg daily and dapagliflozin (Farxiga) 5 mg daily.  He tried increasing his Marcelline Deist to 10 mg daily, but developed significant nocturnal leg cramps from this. The cramps resolved once he went back down on his dose.   Carlos Montgomery has a history of hypertension and has been managed on metoprolol succinate 50 mg daily and Diovan-HCT 160-12.5 mg daily.    Carlos Montgomery has a history of CAD and is s/p inferior STEMI in 2016. He had two RCA stents placed. He is currently on metoprolol succinate 50 mg daily, aspirin 81 mg daily and clopidogrel 75 mg daily. Carlos Montgomery has a history of hyperlipidemia and is managed on atorvastatin 40 mg daily, and fenofibrate 160 mg daily. He follows with Dr. Eldridge Dace.  Past Medical History: Patient Active Problem List   Diagnosis Date Noted   Ocular trauma of right eye 03/06/2020   History of inferior wall myocardial infarction 01/19/2015   Erectile dysfunction 01/19/2015   CAD (coronary artery disease), native coronary artery 01/19/2015   Type 2 diabetes mellitus without complication (HCC) 12/16/2013   Hepatic steatosis 06/13/2010   Retinal vascular occlusion 05/23/2010   History of colon polyps 11/15/2007   Peyronie's disease 11/15/2007   Hyperlipidemia 11/11/2007   Anxiety 11/11/2007   Essential hypertension 11/11/2007   GERD 11/11/2007   Past Surgical History:  Procedure Laterality Date   broken wrists     CARDIAC CATHETERIZATION N/A 11/29/2014    Procedure: Left Heart Cath and Coronary Angiography;  Surgeon: Corky Crafts, MD;  Location: [facility] INVASIVE CV LAB;  Service: Cardiovascular;  Laterality: N/A;   CARDIAC CATHETERIZATION N/A 11/29/2014   Procedure: Coronary Stent Intervention;  Surgeon: Corky Crafts, MD;  Location: [facility] INVASIVE CV LAB;  Service: Cardiovascular;  Laterality: N/A;   RETINAL DETACHMENT SURGERY     RHINOPLASTY     TONSILLECTOMY AND ADENOIDECTOMY     Family History  Problem Relation Age of Onset   Heart disease Father    Hyperlipidemia Father    Heart attack Maternal Grandfather 49   Heart attack Paternal Grandfather 42   Colon cancer Neg Hx    Pancreatic cancer Neg Hx    Stomach cancer Neg Hx    Hypertension Neg Hx    Stroke Neg Hx    Outpatient Medications Prior to Visit  Medication Sig Dispense Refill   ALPRAZolam (XANAX) 0.5 MG tablet TAKE 1/2 TO 1 TABLET BY MOUTH TWICE A DAY AS NEEDED FOR ANXIETY 45 tablet 1   Ascorbic Acid (VITAMIN C) 1000 MG tablet Take 1,000 mg by mouth daily.     aspirin 81 MG tablet Take 81 mg by mouth daily.      atorvastatin (LIPITOR) 40 MG tablet TAKE 1 TABLET BY MOUTH EVERY DAY 90 tablet 3   Cinnamon 500 MG TABS Take 3 tablets by mouth daily.     clopidogrel (PLAVIX) 75 MG tablet TAKE 1 TABLET BY MOUTH EVERY DAY 90  tablet 2   ezetimibe (ZETIA) 10 MG tablet TAKE 1 TABLET BY MOUTH EVERY DAY 90 tablet 3   FARXIGA 5 MG TABS tablet TAKE 1 TABLET (5 MG TOTAL) BY MOUTH DAILY. 90 tablet 3   fenofibrate 160 MG tablet TAKE 1 TABLET BY MOUTH EVERY DAY 90 tablet 3   magnesium oxide (MAG-OX) 400 MG tablet Take 400 mg by mouth daily.     metFORMIN (GLUCOPHAGE) 500 MG tablet Take 1 tablet (500 mg total) by mouth daily with breakfast. Needs appointment for further refills. 180 tablet 1   metoprolol succinate (TOPROL-XL) 50 MG 24 hr tablet TAKE 1 TABLET BY MOUTH EVERY DAY WITH OR IMMEDIATELY FOLLOWING A MEAL 90 tablet 3   Multiple Vitamin (THERA) TABS Take 1 tablet by mouth daily.      nitroGLYCERIN (NITROSTAT) 0.4 MG SL tablet Place 1 tablet (0.4 mg total) under the tongue every 5 (five) minutes as needed for chest pain (X3 DOSES BEFORE CALLING 911). 25 tablet 6   Omega-3 Fatty Acids (FISH OIL) 1200 MG CAPS Take 2,400 mg by mouth daily.     pantoprazole (PROTONIX) 40 MG tablet TAKE 1 TABLET BY MOUTH EVERY DAY 90 tablet 3   sildenafil (VIAGRA) 50 MG tablet Take 1 tablet (50 mg total) by mouth daily as needed for erectile dysfunction. 10 tablet 11   valsartan-hydrochlorothiazide (DIOVAN-HCT) 160-12.5 MG tablet TAKE 1 TABLET BY MOUTH EVERY DAY 90 tablet 3   zinc gluconate 50 MG tablet Take 50 mg by mouth daily.     No facility-administered medications prior to visit.   Allergies  Allergen Reactions   Peanut-Containing Drug Products Other (See Comments)    Pine nuts only.   Codeine Palpitations    Increases heart rate, rapid heart rate   Niacin Other (See Comments)    unknown   Penicillins Other (See Comments)    Does not know what reaction - happened as a child Was told not to take as a child.   Rosuvastatin Other (See Comments)    Night sweats   Objective:   Today's Vitals   11/20/22 0917  BP: 120/84  Pulse: 79  Temp: 98 F (36.7 C)  TempSrc: Temporal  SpO2: 98%  Weight: 176 lb (79.8 kg)  Height: 5\' 10"  (1.778 m)   Body mass index is 25.25 kg/m.   General: Well developed, well nourished. No acute distress. Feet- Skin intact. No sign of maceration between toes. Nails are normal. Dorsalis pedis and posterior   tibial artery pulses are normal. 5.07 monofilament testing normal. Psych: Alert and oriented. Normal mood and affect.  Health Maintenance Due  Topic Date Due   Zoster Vaccines- Shingrix (1 of 2) Never done   OPHTHALMOLOGY EXAM  07/03/2022   Diabetic kidney evaluation - eGFR measurement  10/26/2022   Diabetic kidney evaluation - Urine ACR  10/26/2022   FOOT EXAM  10/26/2022   Colonoscopy  02/09/2023     Assessment & Plan:   Problem List  Items Addressed This Visit       Cardiovascular and Mediastinum   CAD (coronary artery disease), native coronary artery    Stable. No angina. Continue metoprolol succinate 50 mg daily, aspirin 81 mg daily and clopidogrel 75 mg daily.      Essential hypertension - Primary    Blood pressure is in good control. Continue metoprolol succinate 50 mg daily and Diovan-HCT 160-12.5 mg daily.        Endocrine   Type 2 diabetes mellitus without complication (  HCC)    We will check an A1c and annual renal labs today. This has been stable at 7.2% over the past 6 months. Continue metformin 500 mg daily and dapagliflozin (Farxiga) 5 mg daily.       Relevant Orders   Hemoglobin A1c   Microalbumin / creatinine urine ratio   Basic metabolic panel     Other   Hyperlipidemia    Lipids at goal. Continue atorvastatin 40 mg daily.      Other Visit Diagnoses     Screening for colon cancer       Relevant Orders   Ambulatory referral to Gastroenterology       Return in about 3 months (around 02/19/2023) for Reassessment.   Loyola Mast, MD

## 2022-11-20 NOTE — Assessment & Plan Note (Signed)
Stable. No angina. Continue metoprolol succinate 50 mg daily, aspirin 81 mg daily and clopidogrel 75 mg daily. 

## 2022-11-20 NOTE — Assessment & Plan Note (Signed)
Blood pressure is in good control. Continue metoprolol succinate 50 mg daily and Diovan-HCT 160-12.5 mg daily.

## 2022-11-21 ENCOUNTER — Ambulatory Visit (INDEPENDENT_AMBULATORY_CARE_PROVIDER_SITE_OTHER): Payer: BC Managed Care – PPO

## 2022-11-21 DIAGNOSIS — Z23 Encounter for immunization: Secondary | ICD-10-CM

## 2022-11-21 NOTE — Progress Notes (Signed)
Per orders of Dr Veto Kemps, injection of shingle vaccine (#1) given in LT deltoid  by Mickle Plumb, cma.  Patient tolerated injection well. Will get second one at next office Visit. Dm/cma

## 2022-11-26 ENCOUNTER — Telehealth: Payer: Self-pay | Admitting: *Deleted

## 2022-11-26 NOTE — Telephone Encounter (Signed)
Message below is from scheduling message    Thanks so much.  I hope you have a wonderful afternoon as well.     Revonda Humphrey A Sells5 days ago   AC Good morning,   I have forwarded your message to [name], but she is out until Monday. You should hear back from her next week, have a great day!    [name] N routed conversation to [employer] days ago   [name]  P [facility] Scheduling5 days ago    She can either send me a message of call my cell #[phone].  Thanks again and I hope you have a blessed day.    [name]  P [facility] Scheduling5 days ago    Good morning April.  I just received the letter yesterday that my FAVORITE Cardiologist is moving to [place]. Would you be so kind as to have his nurse reach out to me please, whenever she has a moment.  I have a few questions.  Thanks so much.    [name] A Sells12 days ago   AH Good afternoon! My apologies, but this is for cardiology. We would be unable to schedule for that type of appointment. I would encourage you to reach out to the other office. If you would like to schedule for cardiology, please reach back out. Thank you and have a great day!     Carlos Montgomery  P [facility] Scheduling12 days ago    Appointment Request From: Carlos Montgomery   With Provider: Lance Muss [facility] [address]]   Preferred Date Range: From 11/14/2022 To 12/13/2022   Preferred Times: Any   Reason: To address the following health maintenance concerns. Colonoscopy   Comments: I have not been scheduled for my Colon Cancer Screening/Colonoscopy?  Is this something that Dr. Vickii Penna office can schedule for me?  I believe the last Colonoscopy I had was with Dr. Arlyce Dice?  Thanks for your guidance and help.

## 2022-11-26 NOTE — Telephone Encounter (Signed)
I spoke with patient.  He will plan to follow up at [facility] with Dr Clifton James or Dr Lynnette Caffey

## 2022-12-04 ENCOUNTER — Other Ambulatory Visit: Payer: Self-pay | Admitting: Family Medicine

## 2022-12-19 ENCOUNTER — Encounter: Payer: Self-pay | Admitting: Family Medicine

## 2022-12-25 ENCOUNTER — Encounter: Payer: Self-pay | Admitting: Internal Medicine

## 2022-12-31 ENCOUNTER — Ambulatory Visit (INDEPENDENT_AMBULATORY_CARE_PROVIDER_SITE_OTHER): Payer: BC Managed Care – PPO | Admitting: Internal Medicine

## 2022-12-31 ENCOUNTER — Other Ambulatory Visit (INDEPENDENT_AMBULATORY_CARE_PROVIDER_SITE_OTHER): Payer: BC Managed Care – PPO

## 2022-12-31 ENCOUNTER — Encounter: Payer: Self-pay | Admitting: Internal Medicine

## 2022-12-31 VITALS — BP 110/70 | HR 91 | Ht 70.0 in | Wt 176.0 lb

## 2022-12-31 DIAGNOSIS — R140 Abdominal distension (gaseous): Secondary | ICD-10-CM

## 2022-12-31 DIAGNOSIS — K589 Irritable bowel syndrome without diarrhea: Secondary | ICD-10-CM | POA: Diagnosis not present

## 2022-12-31 DIAGNOSIS — Z1211 Encounter for screening for malignant neoplasm of colon: Secondary | ICD-10-CM | POA: Diagnosis not present

## 2022-12-31 LAB — CBC WITH DIFFERENTIAL/PLATELET
Basophils Absolute: 0 10*3/uL (ref 0.0–0.1)
Basophils Relative: 0.6 % (ref 0.0–3.0)
Eosinophils Absolute: 0.1 10*3/uL (ref 0.0–0.7)
Eosinophils Relative: 0.8 % (ref 0.0–5.0)
HCT: 44.8 % (ref 39.0–52.0)
Hemoglobin: 15.1 g/dL (ref 13.0–17.0)
Lymphocytes Relative: 22.1 % (ref 12.0–46.0)
Lymphs Abs: 1.7 10*3/uL (ref 0.7–4.0)
MCHC: 33.7 g/dL (ref 30.0–36.0)
MCV: 95.6 fL (ref 78.0–100.0)
Monocytes Absolute: 0.8 10*3/uL (ref 0.1–1.0)
Monocytes Relative: 10.6 % (ref 3.0–12.0)
Neutro Abs: 5 10*3/uL (ref 1.4–7.7)
Neutrophils Relative %: 65.9 % (ref 43.0–77.0)
Platelets: 251 10*3/uL (ref 150.0–400.0)
RBC: 4.69 Mil/uL (ref 4.22–5.81)
RDW: 12.6 % (ref 11.5–15.5)
WBC: 7.6 10*3/uL (ref 4.0–10.5)

## 2022-12-31 LAB — TSH: TSH: 0.67 u[IU]/mL (ref 0.35–5.50)

## 2022-12-31 LAB — HIGH SENSITIVITY CRP: CRP, High Sensitivity: 0.98 mg/L (ref 0.000–5.000)

## 2022-12-31 MED ORDER — SUTAB 1479-225-188 MG PO TABS: ORAL_TABLET | ORAL | 0 refills | Status: AC

## 2022-12-31 NOTE — Progress Notes (Unsigned)
Chief Complaint: Colon cancer screening  HPI : 57 year old male with history of CAD on Plavix, DM, and HTN presents to discuss colon cancer screening.  Last colonoscopy in 2014 was normal. Denies blood in stools, constipation, change in bowel habits, weight loss, N&V, and dysphagia. He has on average 6 BMs per day. Denies nocturnal stools. He has had diarrhea for the last 20 years. He still has his diarrhea. Denies prior issues with pancreas. Denies ab pain. He does not take antidiarrheal medications regularly. He has used Imodium if his diarrhea gets really bad. Pantoprazole keeps his acid reflux under good control. Denies family history of colon cancer. Endorses bloating and flatulence. He has been trying to mushroom coffee, which seems to make his stools not as loose. He has figured out that green leafy vegetables, fatty foods, and nuts seem to make his symptoms worse.   Wt Readings from Last 3 Encounters:  12/31/22 176 lb (79.8 kg)  11/20/22 176 lb (79.8 kg)  09/04/22 178 lb 3.2 oz (80.8 kg)   Past Medical History:  Diagnosis Date   CAD (coronary artery disease)    cath 11/29/2014 bifurcation Culotte stent to distal RCA and ost PDA treated with 2.75x28 mm Synergy stent, and ost PLA with 2.5x81mm DES, 90% small OM2   Diabetes mellitus    metformin   Hyperlipemia    Hypertension    Pulmonary nodule 09/06/2010   Tobacco abuse 11/29/2014     Past Surgical History:  Procedure Laterality Date   broken wrists     CARDIAC CATHETERIZATION N/A 11/29/2014   Procedure: Left Heart Cath and Coronary Angiography;  Surgeon: Corky Crafts, MD;  [facility] INVAS[facility] CV LAB;  Service: Cardiovascular;  Laterality: N/A;   CARDIAC CATHETERIZATION N/A 11/29/2014   Procedure: Coronary Stent Intervention;  Surgeon: Corky Crafts, MD;  Location: [facility] INVASIVE CV LAB;  Service: Cardiovascular;  Laterality: N/A;   RETINAL DETACHMENT SURGERY     RHINOPLASTY     TONSILLECTOMY AND ADENOIDECTOMY      Family History  Problem Relation Age of Onset   Heart disease Father    Hyperlipidemia Father    Heart attack Maternal Grandfather 16   Heart attack Paternal Grandfather 107   Colon cancer Neg Hx    Pancreatic cancer Neg Hx    Stomach cancer Neg Hx    Hypertension Neg Hx    Stroke Neg Hx    Social History   Tobacco Use   Smoking status: Former    Current packs/day: 0.00    Types: Cigarettes    Quit date: 03/18/1998    Years since quitting: 24.8   Smokeless tobacco: Never   Tobacco comments:    smoked <1ppd for 15 yrs & quit ~2000...  Vaping Use   Vaping status: Never Used  Substance Use Topics   Alcohol use: No    Alcohol/week: 0.0 standard drinks of alcohol   Drug use: No   Current Outpatient Medications  Medication Sig Dispense Refill   ALPRAZolam (XANAX) 0.5 MG tablet TAKE 1/2 TO 1 TABLET BY MOUTH TWICE A DAY AS NEEDED FOR ANXIETY 45 tablet 1   Ascorbic Acid (VITAMIN C) 1000 MG tablet Take 1,000 mg by mouth daily.     aspirin 81 MG tablet Take 81 mg by mouth daily.      atorvastatin (LIPITOR) 40 MG tablet TAKE 1 TABLET BY MOUTH EVERY DAY 90 tablet 3   Cinnamon 500 MG TABS Take 3 tablets by mouth daily.  clopidogrel (PLAVIX) 75 MG tablet TAKE 1 TABLET BY MOUTH EVERY DAY 90 tablet 3   ezetimibe (ZETIA) 10 MG tablet TAKE 1 TABLET BY MOUTH EVERY DAY 90 tablet 3   FARXIGA 5 MG TABS tablet TAKE 1 TABLET (5 MG TOTAL) BY MOUTH DAILY. 90 tablet 3   fenofibrate 160 MG tablet TAKE 1 TABLET BY MOUTH EVERY DAY 90 tablet 3   magnesium oxide (MAG-OX) 400 MG tablet Take 400 mg by mouth daily.     metFORMIN (GLUCOPHAGE) 500 MG tablet Take 1 tablet (500 mg total) by mouth daily with breakfast. Needs appointment for further refills. 180 tablet 1   metoprolol succinate (TOPROL-XL) 50 MG 24 hr tablet TAKE 1 TABLET BY MOUTH EVERY DAY WITH OR IMMEDIATELY FOLLOWING A MEAL 90 tablet 3   Multiple Vitamin (THERA) TABS Take 1 tablet by mouth daily.     nitroGLYCERIN (NITROSTAT) 0.4 MG SL  tablet Place 1 tablet (0.4 mg total) under the tongue every 5 (five) minutes as needed for chest pain (X3 DOSES BEFORE CALLING 911). 25 tablet 6   Omega-3 Fatty Acids (FISH OIL) 1200 MG CAPS Take 2,400 mg by mouth daily.     pantoprazole (PROTONIX) 40 MG tablet TAKE 1 TABLET BY MOUTH EVERY DAY 90 tablet 3   sildenafil (VIAGRA) 50 MG tablet Take 1 tablet (50 mg total) by mouth daily as needed for erectile dysfunction. 10 tablet 11   valsartan-hydrochlorothiazide (DIOVAN-HCT) 160-12.5 MG tablet TAKE 1 TABLET BY MOUTH EVERY DAY 90 tablet 3   zinc gluconate 50 MG tablet Take 50 mg by mouth daily.     No current facility-administered medications for this visit.   Allergies  Allergen Reactions   Peanut-Containing Drug Products Other (See Comments)    Pine nuts only.   Codeine Palpitations    Increases heart rate, rapid heart rate   Niacin Other (See Comments)    unknown   Penicillins Other (See Comments)    Does not know what reaction - happened as a child Was told not to take as a child.   Rosuvastatin Other (See Comments)    Night sweats     Review of Systems: All systems reviewed and negative except where noted in HPI.   Physical Exam: BP 110/70   Pulse 91   Ht 5\' 10"  (1.778 m)   Wt 176 lb (79.8 kg)   BMI 25.25 kg/m  Constitutional: Pleasant,well-developed, male in no acute distress. HEENT: Normocephalic and atraumatic. Conjunctivae are normal. No scleral icterus. Cardiovascular: Normal rate, systolic murmur Pulmonary/chest: Effort normal and breath sounds normal. No wheezing, rales or rhonchi. Abdominal: Soft, nondistended, nontender. Bowel sounds active throughout. There are no masses palpable. No hepatomegaly. Extremities: No edema Neurological: Alert and oriented to person place and time. Skin: Skin is warm and dry. No rashes noted. Psychiatric: Normal mood and affect. Behavior is normal.  Labs 11/2022: BMP with mildly low Na of 131 and elevated Cr of 1.52.   PET CT  06/11/10: IMPRESSION:  1. Low metabolic activity of left upper lobe pulmonary nodule  suggest a benign etiology.  Recommend follow-up CT scan in 3 months  without contrast to evaluate for stability.  Certain low grade  adenocarcinoma can exhibit low metabolic activity.  2.  Nodular density in the inferior right middle lobe does not have  appreciable metabolic activity.  Additionally there is new ground-  glass opacity /atelectasis in the right lower lobe.  Above findings  may indicate infectious or inflammatory etiology of the nodularity.  EGD 11/19/05: Normal  Colonoscopy 11/14/06: Two 5 mm polyps in the descending and sigmoid colon resected with hot snare. Good prep. Random biopsies taken to rule out microscopic colitis Path: Adenomatous polyps. Normal colon biopsies  Colonoscopy 02/08/13:   ASSESSMENT AND PLAN: Colon cancer screening IBS-D Bloating - Check CBC, CRP, TTG IgA, IgA, TSH - SIBO breath test - Colonoscopy [facility]. Plan for Sutab. Will have Plavix held 5 days beforehand. Will reach out to [facility] cardiology or PCP to get permission.  Eulah Pont, MD

## 2022-12-31 NOTE — Patient Instructions (Addendum)
Your provider has requested that you go to the basement level for lab work before leaving today. Press "B" on the elevator. The lab is located at the first door on the left as you exit the elevator.  You have been given a testing kit to check for small intestine bacterial overgrowth (SIBO) which is completed by a company named [facility]. Make sure to return your test in the mail using the return mailing label given to you along with the kit. The test order, your demographic and insurance information have all already been sent to the company. [facility] will collect an upfront charge of $99.74 for commercial insurance plans and $209.74 if you are paying cash. Make sure to discuss with [facility] PRIOR to having the test to see if they have gotten information from your insurance company as to how much your testing will cost out of pocket, if any. Please contact [facility] at phone number [phone] to get instructions regarding how to perform the test as our office is unable to give specific testing instructions.  You have been scheduled for a colonoscopy. Please follow written instructions given to you at your visit today.   Please pick up your prep supplies at the pharmacy within the next 1-3 days.  If you use inhalers (even only as needed), please bring them with you on the day of your procedure.  DO NOT TAKE 7 DAYS PRIOR TO TEST- Trulicity (dulaglutide) Ozempic, Wegovy (semaglutide) Mounjaro (tirzepatide) Bydureon Bcise (exanatide extended release)  DO NOT TAKE 1 DAY PRIOR TO YOUR TEST Rybelsus (semaglutide) Adlyxin (lixisenatide) Victoza (liraglutide) Byetta (exanatide)  You will be contacted by our office prior to your procedure for directions on holding your Plavix.  If you do not hear from our office 1 week prior to your scheduled procedure, please call [phone] to discuss.   We have sent the following medications to your pharmacy for you to pick up at your  convenience: Sutab  If your blood pressure at your visit was 140/90 or greater, please contact your primary care physician to follow up on this.  _______________________________________________________  If you are age 28 or older, your body mass index should be between 23-30. Your Body mass index is 25.25 kg/m. If this is out of the aforementioned range listed, please consider follow up with your Primary Care Provider.  If you are age 56 or younger, your body mass index should be between 19-25. Your Body mass index is 25.25 kg/m. If this is out of the aformentioned range listed, please consider follow up with your Primary Care Provider.   ________________________________________________________  The [facility] providers would like to encourage you to use [facility] to communicate with providers for non-urgent requests or questions.  Due to long hold times on the telephone, sending your provider a message by [facility] may be a faster and more efficient way to get a response.  Please allow 48 business hours for a response.  Please remember that this is for non-urgent requests.  _______________________________________________________  Due to recent changes in healthcare laws, you may see the results of your imaging and laboratory studies on MyChart before your provider has had a chance to review them.  We understand that in some cases there may be results that are confusing or concerning to you. Not all laboratory results come back in the same time frame and the provider may be waiting for multiple results in order to interpret others.  Please give [place] 48 hours in order for your provider to thoroughly  review all the results before contacting the office for clarification of your results.   Thank you for entrusting me with your care and for choosing [facility], Dr. Eulah Pont

## 2023-01-01 LAB — TISSUE TRANSGLUTAMINASE, IGA: (tTG) Ab, IgA: <1 U/mL

## 2023-01-01 LAB — IGA: Immunoglobulin A: 113 mg/dL (ref 47–310)

## 2023-01-24 ENCOUNTER — Encounter: Payer: Self-pay | Admitting: Internal Medicine

## 2023-01-29 DIAGNOSIS — N401 Enlarged prostate with lower urinary tract symptoms: Secondary | ICD-10-CM | POA: Diagnosis not present

## 2023-01-29 DIAGNOSIS — R3911 Hesitancy of micturition: Secondary | ICD-10-CM | POA: Diagnosis not present

## 2023-01-29 DIAGNOSIS — N3281 Overactive bladder: Secondary | ICD-10-CM | POA: Diagnosis not present

## 2023-01-29 DIAGNOSIS — N529 Male erectile dysfunction, unspecified: Secondary | ICD-10-CM | POA: Diagnosis not present

## 2023-01-30 ENCOUNTER — Other Ambulatory Visit: Payer: Self-pay | Admitting: Family Medicine

## 2023-01-30 DIAGNOSIS — F419 Anxiety disorder, unspecified: Secondary | ICD-10-CM

## 2023-02-03 ENCOUNTER — Telehealth: Payer: Self-pay

## 2023-02-03 NOTE — Telephone Encounter (Signed)
   Patient Name: Carlos Montgomery  DOB: [birth year] MRN: [id number]  Primary Cardiologist: Lance Muss, MD  Chart reviewed as part of pre-operative protocol coverage  Per protocol patient can hold Plavix 5 days prior to scheduled procedure and should restart postprocedure when surgically safe and hemostasis is achieved.   Napoleon Form, Leodis Rains, NP 02/03/2023, 11:56 AM

## 2023-02-03 NOTE — Telephone Encounter (Signed)
[facility] Pre-operative Risk Assessment     Request for surgical clearance:     Endoscopy Procedure  What type of surgery is being performed?     colonoscopy  When is this surgery scheduled?     02/05/23  What type of clearance is required ?   Pharmacy  Are there any medications that need to be held prior to surgery and how long?  Plavix to be held 5 days  Practice name and name of physician performing surgery?      [facility]  What is your office phone and fax number?      Phone- [phone]  Fax- [phone]  Anesthesia type (None, local, MAC, general) ?       MAC

## 2023-02-03 NOTE — Telephone Encounter (Signed)
Note [facility] Pre-operative Risk Assessment     Request for surgical clearance:     Endoscopy Procedure  What type of surgery is being performed?     colonoscopy  When is this surgery scheduled?     02/05/23  What type of clearance is required ?   Pharmacy  Are there any medications that need to be held prior to surgery and how long?  Plavix to be held 5 days  Practice name and name of physician performing surgery?      [facility]  What is your office phone and fax number?      Phone- [phone]  Fax- [phone]  Anesthesia type (None, local, MAC, general) ?       MAC

## 2023-02-03 NOTE — Telephone Encounter (Signed)
Pharmacy clearance request to hold Plavix not sent in error. The patient is scheduled for a colonoscopy 02/05/23. Called the patient to explain and reschedule. The patient states, I have not taken my Plavix since Friday (01/31/23)" Request for clearance routed to [facility].

## 2023-02-03 NOTE — Telephone Encounter (Signed)
Received fax from cardiology okay to hold Plavix 5 days prior to procedure. Spoke to patient he is aware and has been holding Plavix.

## 2023-02-05 ENCOUNTER — Ambulatory Visit: Payer: BC Managed Care – PPO | Admitting: Internal Medicine

## 2023-02-05 ENCOUNTER — Encounter: Payer: Self-pay | Admitting: Internal Medicine

## 2023-02-05 VITALS — BP 133/90 | HR 82 | Temp 99.1°F | Resp 19 | Ht 70.0 in | Wt 176.0 lb

## 2023-02-05 DIAGNOSIS — K6389 Other specified diseases of intestine: Secondary | ICD-10-CM

## 2023-02-05 DIAGNOSIS — Z860100 Personal history of colon polyps, unspecified: Secondary | ICD-10-CM | POA: Diagnosis not present

## 2023-02-05 DIAGNOSIS — K635 Polyp of colon: Secondary | ICD-10-CM | POA: Diagnosis not present

## 2023-02-05 DIAGNOSIS — D122 Benign neoplasm of ascending colon: Secondary | ICD-10-CM

## 2023-02-05 DIAGNOSIS — D125 Benign neoplasm of sigmoid colon: Secondary | ICD-10-CM

## 2023-02-05 DIAGNOSIS — K5730 Diverticulosis of large intestine without perforation or abscess without bleeding: Secondary | ICD-10-CM

## 2023-02-05 DIAGNOSIS — Z1211 Encounter for screening for malignant neoplasm of colon: Secondary | ICD-10-CM

## 2023-02-05 DIAGNOSIS — D123 Benign neoplasm of transverse colon: Secondary | ICD-10-CM

## 2023-02-05 DIAGNOSIS — D124 Benign neoplasm of descending colon: Secondary | ICD-10-CM | POA: Diagnosis not present

## 2023-02-05 DIAGNOSIS — K639 Disease of intestine, unspecified: Secondary | ICD-10-CM | POA: Diagnosis not present

## 2023-02-05 DIAGNOSIS — Z860101 Personal history of adenomatous and serrated colon polyps: Secondary | ICD-10-CM | POA: Diagnosis not present

## 2023-02-05 DIAGNOSIS — K633 Ulcer of intestine: Secondary | ICD-10-CM | POA: Diagnosis not present

## 2023-02-05 DIAGNOSIS — K529 Noninfective gastroenteritis and colitis, unspecified: Secondary | ICD-10-CM | POA: Diagnosis not present

## 2023-02-05 MED ORDER — SODIUM CHLORIDE 0.9 % IV SOLN: 500.0000 mL | Freq: Once | INTRAVENOUS | Status: DC

## 2023-02-05 NOTE — Op Note (Addendum)
[facility] Patient Name: Carlos Montgomery Procedure Date: 02/05/2023 9:58 AM MRN: [id number] Endoscopist: Madelyn Brunner [place] , , [id number] Age: 57 Referring MD:  Date of Birth: [birth year] Gender: Male Account #: [id number] Procedure:                Colonoscopy Indications:              Screening for colorectal malignant neoplasm Medicines:                Monitored Anesthesia Care Procedure:                Pre-Anesthesia Assessment:                           - Prior to the procedure, a History and Physical                            was performed, and patient medications and                            allergies were reviewed. The patient's tolerance of                            previous anesthesia was also reviewed. The risks                            and benefits of the procedure and the sedation                            options and risks were discussed with the patient.                            All questions were answered, and informed consent                            was obtained. Prior Anticoagulants: The patient has                            taken Plavix (clopidogrel), last dose was 5 days                            prior to procedure. ASA Grade Assessment: III - A                            patient with severe systemic disease. After                            reviewing the risks and benefits, the patient was                            deemed in satisfactory condition to undergo the                            procedure.  After obtaining informed consent, the colonoscope                            was passed under direct vision. Throughout the                            procedure, the patient's blood pressure, pulse, and                            oxygen saturations were monitored continuously. The                            Olympus Scope SN [id number] was introduced through the                            anus and advanced to the the  terminal ileum. The                            colonoscopy was performed without difficulty. The                            patient tolerated the procedure well. The quality                            of the bowel preparation was good. The terminal                            ileum, ileocecal valve, appendiceal orifice, and                            rectum were photographed. Scope In: 10:03:41 AM Scope Out: 10:30:06 AM Scope Withdrawal Time: 0 hours 23 minutes 31 seconds  Total Procedure Duration: 0 hours 26 minutes 25 seconds  Findings:                 A localized area of mucosa in the terminal ileum                            was mildly erythematous. This was biopsied with a                            cold forceps for histology.                           Multiple diverticula were found in the entire colon.                           Five sessile polyps were found in the sigmoid                            colon, descending colon, transverse colon and                            ascending colon. The polyps were 3 to 8  mm in size.                            These polyps were removed with a cold snare.                            Resection and retrieval were complete.                           Multiple localized non-bleeding erosions were found                            in the ascending colon. No stigmata of recent                            bleeding were seen. Biopsies were taken with a cold                            forceps for histology.                           A localized area of mildly erythematous mucosa was                            found in the sigmoid colon. This was biopsied with                            a cold forceps for histology.                           Non-bleeding internal hemorrhoids were found during                            retroflexion. Complications:            No immediate complications. Estimated Blood Loss:     Estimated blood loss was minimal. Impression:                - Erythematous mucosa in the terminal ileum.                            Biopsied.                           - Diverticulosis in the entire examined colon.                           - Five 3 to 8 mm polyps in the sigmoid colon, in                            the descending colon, in the transverse colon and                            in the ascending colon, removed with a cold snare.  Resected and retrieved.                           - Multiple erosions in the ascending colon.                            Biopsied.                           - Erythematous mucosa in the sigmoid colon.                            Biopsied.                           - Non-bleeding internal hemorrhoids. Recommendation:           - Discharge patient to home (with escort).                           - Await pathology results.                           - Okay to restart Plavix tomorrow.                           - The findings and recommendations were discussed                            with the patient. Dr Particia Lather "[place]" [place],  02/05/2023 10:36:08 AM

## 2023-02-05 NOTE — Progress Notes (Signed)
Called to room to assist during endoscopic procedure.  Patient ID and intended procedure confirmed with present staff. Received instructions for my participation in the procedure from the performing physician.  

## 2023-02-05 NOTE — Progress Notes (Signed)
GASTROENTEROLOGY PROCEDURE H&P NOTE   Primary Care Physician: Loyola Mast, MD    Reason for Procedure:   Colon cancer screening  Plan:    Colonoscopy  Patient is appropriate for endoscopic procedure(s) in the ambulatory ([facility]) setting.  The nature of the procedure, as well as the risks, benefits, and alternatives were carefully and thoroughly reviewed with the patient. Ample time for discussion and questions allowed. The patient understood, was satisfied, and agreed to proceed.     HPI: Carlos Montgomery is a 57 y.o. male who presents for colonoscopy for evaluation of colon cancer screening .  Patient was most recently seen in the Gastroenterology Clinic on 12/31/22.  No interval change in medical history since that appointment. Please refer to that note for full details regarding GI history and clinical presentation.   Past Medical History:  Diagnosis Date   CAD (coronary artery disease)    cath 11/29/2014 bifurcation Culotte stent to distal RCA and ost PDA treated with 2.75x28 mm Synergy stent, and ost PLA with 2.5x51mm DES, 90% small OM2   Diabetes mellitus    metformin   Hyperlipemia    Hypertension    Pulmonary nodule 09/06/2010   Tobacco abuse 11/29/2014    Past Surgical History:  Procedure Laterality Date   broken wrists     CARDIAC CATHETERIZATION N/A 11/29/2014   Procedure: Left Heart Cath and Coronary Angiography;  Surgeon: Corky Crafts, MD;  Location: [facility] INVASIVE CV LAB;  Service: Cardiovascular;  Laterality: N/A;   CARDIAC CATHETERIZATION N/A 11/29/2014   Procedure: Coronary Stent Intervention;  Surgeon: Corky Crafts, MD;  Location: [facility] INVASIVE CV LAB;  Service: Cardiovascular;  Laterality: N/A;   RETINAL DETACHMENT SURGERY     RHINOPLASTY     TONSILLECTOMY AND ADENOIDECTOMY      Prior to Admission medications   Medication Sig Start Date End Date Taking? Authorizing Provider  ALPRAZolam Prudy Feeler) 0.5 MG tablet TAKE 1/2 TO 1 TABLET BY MOUTH TWICE  A DAY AS NEEDED FOR ANXIETY 01/30/23   Loyola Mast, MD  Ascorbic Acid (VITAMIN C) 1000 MG tablet Take 1,000 mg by mouth daily.    [provider]  aspirin 81 MG tablet Take 81 mg by mouth daily.     [provider]  atorvastatin (LIPITOR) 40 MG tablet TAKE 1 TABLET BY MOUTH EVERY DAY 10/20/22   Loyola Mast, MD  Cinnamon 500 MG TABS Take 3 tablets by mouth daily.    [provider]  clopidogrel (PLAVIX) 75 MG tablet TAKE 1 TABLET BY MOUTH EVERY DAY 12/04/22   Loyola Mast, MD  ezetimibe (ZETIA) 10 MG tablet TAKE 1 TABLET BY MOUTH EVERY DAY 10/20/22   Loyola Mast, MD  FARXIGA 5 MG TABS tablet TAKE 1 TABLET (5 MG TOTAL) BY MOUTH DAILY. 08/25/22   Loyola Mast, MD  fenofibrate 160 MG tablet TAKE 1 TABLET BY MOUTH EVERY DAY 10/17/22   Loyola Mast, MD  magnesium oxide (MAG-OX) 400 MG tablet Take 400 mg by mouth daily.    [provider]  metFORMIN (GLUCOPHAGE) 500 MG tablet Take 1 tablet (500 mg total) by mouth daily with breakfast. Needs appointment for further refills. 06/05/22   Loyola Mast, MD  metoprolol succinate (TOPROL-XL) 50 MG 24 hr tablet TAKE 1 TABLET BY MOUTH EVERY DAY WITH OR IMMEDIATELY FOLLOWING A MEAL 08/07/22   Loyola Mast, MD  Multiple Vitamin (THERA) TABS Take 1 tablet by mouth daily.  [provider]  nitroGLYCERIN (NITROSTAT) 0.4 MG SL tablet Place 1 tablet (0.4 mg total) under the tongue every 5 (five) minutes as needed for chest pain (X3 DOSES BEFORE CALLING 911). 06/24/22   Corky Crafts, MD  Omega-3 Fatty Acids (FISH OIL) 1200 MG CAPS Take 2,400 mg by mouth daily.    [provider]  pantoprazole (PROTONIX) 40 MG tablet TAKE 1 TABLET BY MOUTH EVERY DAY 10/20/22   Loyola Mast, MD  sildenafil (VIAGRA) 50 MG tablet Take 1 tablet (50 mg total) by mouth daily as needed for erectile dysfunction. 06/05/22   Loyola Mast, MD  Sodium Sulfate-Mag Sulfate-KCl (SUTAB) [phone] MG TABS Use as directed for  colonoscopy. MANUFACTURER CODES!! BIN: [id number] PCN: CN GROUP: [id number] MEMBER ID: [id number];VHQ AS SECONDARY INSURANCE ;NO PRIOR AUTHORIZATION 12/31/22   Imogene Burn, MD  valsartan-hydrochlorothiazide (DIOVAN-HCT) 160-12.5 MG tablet TAKE 1 TABLET BY MOUTH EVERY DAY 10/20/22   Loyola Mast, MD  zinc gluconate 50 MG tablet Take 50 mg by mouth daily.    [provider]    Current Outpatient Medications  Medication Sig Dispense Refill   ALPRAZolam (XANAX) 0.5 MG tablet TAKE 1/2 TO 1 TABLET BY MOUTH TWICE A DAY AS NEEDED FOR ANXIETY 45 tablet 1   Ascorbic Acid (VITAMIN C) 1000 MG tablet Take 1,000 mg by mouth daily.     aspirin 81 MG tablet Take 81 mg by mouth daily.      atorvastatin (LIPITOR) 40 MG tablet TAKE 1 TABLET BY MOUTH EVERY DAY 90 tablet 3   Cinnamon 500 MG TABS Take 3 tablets by mouth daily.     clopidogrel (PLAVIX) 75 MG tablet TAKE 1 TABLET BY MOUTH EVERY DAY 90 tablet 3   ezetimibe (ZETIA) 10 MG tablet TAKE 1 TABLET BY MOUTH EVERY DAY 90 tablet 3   FARXIGA 5 MG TABS tablet TAKE 1 TABLET (5 MG TOTAL) BY MOUTH DAILY. 90 tablet 3   fenofibrate 160 MG tablet TAKE 1 TABLET BY MOUTH EVERY DAY 90 tablet 3   magnesium oxide (MAG-OX) 400 MG tablet Take 400 mg by mouth daily.     metFORMIN (GLUCOPHAGE) 500 MG tablet Take 1 tablet (500 mg total) by mouth daily with breakfast. Needs appointment for further refills. 180 tablet 1   metoprolol succinate (TOPROL-XL) 50 MG 24 hr tablet TAKE 1 TABLET BY MOUTH EVERY DAY WITH OR IMMEDIATELY FOLLOWING A MEAL 90 tablet 3   Multiple Vitamin (THERA) TABS Take 1 tablet by mouth daily.     nitroGLYCERIN (NITROSTAT) 0.4 MG SL tablet Place 1 tablet (0.4 mg total) under the tongue every 5 (five) minutes as needed for chest pain (X3 DOSES BEFORE CALLING 911). 25 tablet 6   Omega-3 Fatty Acids (FISH OIL) 1200 MG CAPS Take 2,400 mg by mouth daily.     pantoprazole (PROTONIX) 40 MG tablet TAKE 1 TABLET BY MOUTH EVERY DAY 90 tablet 3   sildenafil  (VIAGRA) 50 MG tablet Take 1 tablet (50 mg total) by mouth daily as needed for erectile dysfunction. 10 tablet 11   Sodium Sulfate-Mag Sulfate-KCl (SUTAB) [phone] MG TABS Use as directed for colonoscopy. MANUFACTURER CODES!! BIN: [id number] PCN: CN GROUP: [id number] MEMBER ID: [id number];VQQ AS SECONDARY INSURANCE ;NO PRIOR AUTHORIZATION 24 tablet 0   valsartan-hydrochlorothiazide (DIOVAN-HCT) 160-12.5 MG tablet TAKE 1 TABLET BY MOUTH EVERY DAY 90 tablet 3   zinc gluconate 50 MG tablet Take 50 mg by mouth daily.     Current Facility-Administered Medications  Medication  Dose Route Frequency Provider Last Rate Last Admin   0.9 %  sodium chloride infusion  500 mL Intravenous Once Imogene Burn, MD        Allergies as of 02/05/2023 - Review Complete 02/05/2023  Allergen Reaction Noted   Peanut-containing drug products Other (See Comments) 11/29/2014   Codeine Palpitations    Niacin Other (See Comments)    Penicillins Other (See Comments)    Rosuvastatin Other (See Comments)     Family History  Problem Relation Age of Onset   Heart disease Father    Hyperlipidemia Father    Heart attack Maternal Grandfather 90   Heart attack Paternal Grandfather 76   Colon cancer Neg Hx    Pancreatic cancer Neg Hx    Stomach cancer Neg Hx    Hypertension Neg Hx    Stroke Neg Hx     Social History   Socioeconomic History   Marital status: Married    Spouse name: Not on file   Number of children: 2   Years of education: Not on file   Highest education level: Not on file  Occupational History   Occupation: Scientist, research (physical sciences): [employer]   Occupation: Marine scientist  Tobacco Use   Smoking status: Former    Current packs/day: 0.00    Types: Cigarettes    Quit date: 03/18/1998    Years since quitting: 24.9   Smokeless tobacco: Never   Tobacco comments:    smoked <1ppd for 15 yrs & quit ~2000...  Vaping Use   Vaping status: Never Used  Substance and Sexual Activity    Alcohol use: No    Alcohol/week: 0.0 standard drinks of alcohol   Drug use: No   Sexual activity: Yes  Other Topics Concern   Not on file  Social History Narrative   Not on file   Social Determinants of Health   Financial Resource Strain: Not on file  Food Insecurity: Not on file  Transportation Needs: Not on file  Physical Activity: Not on file  Stress: Not on file  Social Connections: Not on file  Intimate Partner Violence: Not on file    Physical Exam: Vital signs in last 24 hours: There were no vitals taken for this visit. GEN: NAD EYE: Sclerae anicteric ENT: MMM CV: Non-tachycardic Pulm: No increased WOB GI: Soft NEURO:  Alert & Oriented   Eulah Pont, MD [facility]   02/05/2023 9:47 AM

## 2023-02-05 NOTE — Patient Instructions (Addendum)
Discharge patient to home (with escort).                           - Await pathology results.                           - Okay to restart Plavix tomorrow.                           - The findings and recommendations were discussed                            with the patient.  Handout on polyps given.    YOU HAD AN ENDOSCOPIC PROCEDURE TODAY AT THE [facility]:   Refer to the procedure report that was given to you for any specific questions about what was found during the examination.  If the procedure report does not answer your questions, please call your gastroenterologist to clarify.  If you requested that your care partner not be given the details of your procedure findings, then the procedure report has been included in a sealed envelope for you to review at your convenience later.  YOU SHOULD EXPECT: Some feelings of bloating in the abdomen. Passage of more gas than usual.  Walking can help get rid of the air that was put into your GI tract during the procedure and reduce the bloating. If you had a lower endoscopy (such as a colonoscopy or flexible sigmoidoscopy) you may notice spotting of blood in your stool or on the toilet paper. If you underwent a bowel prep for your procedure, you may not have a normal bowel movement for a few days.  Please Note:  You might notice some irritation and congestion in your nose or some drainage.  This is from the oxygen used during your procedure.  There is no need for concern and it should clear up in a day or so.  SYMPTOMS TO REPORT IMMEDIATELY:  Following lower endoscopy (colonoscopy or flexible sigmoidoscopy):  Excessive amounts of blood in the stool  Significant tenderness or worsening of abdominal pains  Swelling of the abdomen that is new, acute  Fever of 100F or higher  For urgent or emergent issues, a gastroenterologist can be reached at any hour by calling [phone]. Do not use MyChart messaging for urgent concerns.     DIET:  We do recommend a small meal at first, but then you may proceed to your regular diet.  Drink plenty of fluids but you should avoid alcoholic beverages for 24 hours.  ACTIVITY:  You should plan to take it easy for the rest of today and you should NOT DRIVE or use heavy machinery until tomorrow (because of the sedation medicines used during the test).    FOLLOW UP: Our staff will call the number listed on your records the next business day following your procedure.  We will call around 7:15- 8:00 am to check on you and address any questions or concerns that you may have regarding the information given to you following your procedure. If we do not reach you, we will leave a message.     If any biopsies were taken you will be contacted by phone or by letter within the next 1-3 weeks.  Please call us at [phone] if you have not heard about the  biopsies in 3 weeks.    SIGNATURES/CONFIDENTIALITY: You and/or your care partner have signed paperwork which will be entered into your electronic medical record.  These signatures attest to the fact that that the information above on your After Visit Summary has been reviewed and is understood.  Full responsibility of the confidentiality of this discharge information lies with you and/or your care-partner.

## 2023-02-05 NOTE — Progress Notes (Signed)
Vss nad trans to pacu 

## 2023-02-05 NOTE — Progress Notes (Signed)
Pt's states no medical or surgical changes since previsit or office visit. 

## 2023-02-06 ENCOUNTER — Telehealth: Payer: Self-pay | Admitting: *Deleted

## 2023-02-06 NOTE — Telephone Encounter (Signed)
  Follow up Call-     02/05/2023    9:48 AM  Call back number  Post procedure Call Back phone  # [phone]  Permission to leave phone message Yes     Patient questions:  Do you have a fever, pain , or abdominal swelling? No. Pain Score  0 *  Have you tolerated food without any problems? Yes.    Have you been able to return to your normal activities? Yes.    Do you have any questions about your discharge instructions: Diet   No. Medications  No. Follow up visit  No.  Do you have questions or concerns about your Care? No.  Actions: * If pain score is 4 or above: No action needed, pain <4.

## 2023-02-10 LAB — SURGICAL PATHOLOGY

## 2023-02-11 ENCOUNTER — Encounter: Payer: Self-pay | Admitting: Internal Medicine

## 2023-03-31 ENCOUNTER — Encounter: Payer: Self-pay | Admitting: Interventional Cardiology

## 2023-04-08 ENCOUNTER — Other Ambulatory Visit: Payer: Self-pay | Admitting: Family Medicine

## 2023-04-08 DIAGNOSIS — E119 Type 2 diabetes mellitus without complications: Secondary | ICD-10-CM

## 2023-04-15 ENCOUNTER — Ambulatory Visit: Payer: BC Managed Care – PPO | Admitting: Family Medicine

## 2023-04-16 ENCOUNTER — Encounter: Payer: Self-pay | Admitting: Family Medicine

## 2023-04-16 ENCOUNTER — Ambulatory Visit: Payer: BC Managed Care – PPO | Admitting: Family Medicine

## 2023-04-16 VITALS — BP 124/78 | HR 84 | Temp 97.8°F | Ht 70.0 in | Wt 176.4 lb

## 2023-04-16 DIAGNOSIS — I10 Essential (primary) hypertension: Secondary | ICD-10-CM

## 2023-04-16 DIAGNOSIS — E782 Mixed hyperlipidemia: Secondary | ICD-10-CM

## 2023-04-16 DIAGNOSIS — Z7984 Long term (current) use of oral hypoglycemic drugs: Secondary | ICD-10-CM | POA: Diagnosis not present

## 2023-04-16 DIAGNOSIS — E119 Type 2 diabetes mellitus without complications: Secondary | ICD-10-CM

## 2023-04-16 DIAGNOSIS — N1831 Chronic kidney disease, stage 3a: Secondary | ICD-10-CM | POA: Diagnosis not present

## 2023-04-16 DIAGNOSIS — I2510 Atherosclerotic heart disease of native coronary artery without angina pectoris: Secondary | ICD-10-CM

## 2023-04-16 LAB — PHOSPHORUS: Phosphorus: 3.8 mg/dL (ref 2.3–4.6)

## 2023-04-16 LAB — HEMOGLOBIN A1C: Hgb A1c MFr Bld: 7.4 % — ABNORMAL HIGH (ref 4.6–6.5)

## 2023-04-16 LAB — GLUCOSE, RANDOM: Glucose, Bld: 101 mg/dL — ABNORMAL HIGH (ref 70–99)

## 2023-04-16 MED ORDER — METFORMIN HCL 500 MG PO TABS: 500.0000 mg | ORAL_TABLET | Freq: Two times a day (BID) | ORAL | 1 refills | Status: DC

## 2023-04-16 NOTE — Addendum Note (Signed)
Addended by: Loyola Mast on: 04/16/2023 01:10 PM   Modules accepted: Orders

## 2023-04-16 NOTE — Assessment & Plan Note (Signed)
Lipids at goal. Continue atorvastatin 40 mg daily.

## 2023-04-16 NOTE — Assessment & Plan Note (Signed)
We will check an A1c today. Continue metformin 500 mg daily and dapagliflozin (Farxiga) 5 mg daily.

## 2023-04-16 NOTE — Assessment & Plan Note (Signed)
Blood pressure is in good control. Continue metoprolol succinate 50 mg daily and Diovan-HCT 160-12.5 mg daily.

## 2023-04-16 NOTE — Progress Notes (Signed)
[facility]IMARY [facility]ARE [facility] [address] [place] [address] Dept: [phone] Dept Fax: [phone]  Chronic Care Office Visit  Subjective:    Patient ID: Carlos Montgomery, male    DOB: Nov 21, 1965, 58 y.o..   MRN: [id number]  Chief Complaint  Patient presents with   Hypertension    F/u HTN.      History of Present Illness:  Patient is in today for reassessment of chronic medical issues.  Carlos Montgomery has a history of Type 2 diabetes. He had been managed on metformin 500 mg daily and dapagliflozin (Farxiga) 5 mg daily (has not tolerated higher dosage).     Carlos Montgomery has a history of hypertension. He is managed on metoprolol succinate 50 mg daily and valsartan-HCTZ (Diovan-HCT) 160-12.5 mg daily.    Carlos Montgomery has a history of CAD and is s/p inferior STEMI in 2016. He had two RCA stents placed. He is currently on metoprolol succinate 50 mg daily, aspirin 81 mg daily and clopidogrel 75 mg daily.   Carlos Montgomery has a history of hyperlipidemia and is managed on atorvastatin 40 mg daily, and fenofibrate 160 mg daily.  Past Medical History: Patient Active Problem List   Diagnosis Date Noted   Chronic kidney disease, stage 3a (HCC) 04/16/2023   Ocular trauma of right eye 03/06/2020   History of inferior wall myocardial infarction 01/19/2015   Erectile dysfunction 01/19/2015   CAD (coronary artery disease), native coronary artery 01/19/2015   Type 2 diabetes mellitus without complication (HCC) 12/16/2013   Hepatic steatosis 06/13/2010   Retinal vascular occlusion 05/23/2010   History of colon polyps 11/15/2007   Peyronie's disease 11/15/2007   Hyperlipidemia 11/11/2007   Anxiety 11/11/2007   Essential hypertension 11/11/2007   GERD 11/11/2007   Past Surgical History:  Procedure Laterality Date   broken wrists     CARDIAC CATHETERIZATION N/A 11/29/2014   Procedure: Left Heart Cath and Coronary Angiography;  Surgeon: Corky Crafts, MD;   Location: [facility] INVASIVE CV LAB;  Service: Cardiovascular;  Laterality: N/A;   CARDIAC CATHETERIZATION N/A 11/29/2014   Procedure: Coronary Stent Intervention;  Surgeon: Corky Crafts, MD;  Location: [facility] INVASIVE CV LAB;  Service: Cardiovascular;  Laterality: N/A;   RETINAL DETACHMENT SURGERY     RHINOPLASTY     TONSILLECTOMY AND ADENOIDECTOMY     Family History  Problem Relation Age of Onset   Heart disease Father    Hyperlipidemia Father    Heart attack Maternal Grandfather 31   Heart attack Paternal Grandfather 71   Colon cancer Neg Hx    Pancreatic cancer Neg Hx    Stomach cancer Neg Hx    Hypertension Neg Hx    Stroke Neg Hx    Outpatient Medications Prior to Visit  Medication Sig Dispense Refill   ALPRAZolam (XANAX) 0.5 MG tablet TAKE 1/2 TO 1 TABLET BY MOUTH TWICE A DAY AS NEEDED FOR ANXIETY 45 tablet 1   Ascorbic Acid (VITAMIN C) 1000 MG tablet Take 1,000 mg by mouth daily.     aspirin 81 MG tablet Take 81 mg by mouth daily.      atorvastatin (LIPITOR) 40 MG tablet TAKE 1 TABLET BY MOUTH EVERY DAY 90 tablet 3   Cinnamon 500 MG TABS Take 3 tablets by mouth daily.     clopidogrel (PLAVIX) 75 MG tablet TAKE 1 TABLET BY MOUTH EVERY DAY 90 tablet 3   ezetimibe (ZETIA) 10 MG tablet TAKE 1 TABLET BY MOUTH EVERY DAY  90 tablet 3   FARXIGA 5 MG TABS tablet TAKE 1 TABLET (5 MG TOTAL) BY MOUTH DAILY. 90 tablet 3   fenofibrate 160 MG tablet TAKE 1 TABLET BY MOUTH EVERY DAY 90 tablet 3   magnesium oxide (MAG-OX) 400 MG tablet Take 400 mg by mouth daily.     metFORMIN (GLUCOPHAGE) 500 MG tablet TAKE 1 TABLET (500 MG TOTAL) BY MOUTH DAILY WITH BREAKFAST. NEEDS APPOINTMENT FOR FURTHER REFILLS. 180 tablet 1   metoprolol succinate (TOPROL-XL) 50 MG 24 hr tablet TAKE 1 TABLET BY MOUTH EVERY DAY WITH OR IMMEDIATELY FOLLOWING A MEAL 90 tablet 3   Multiple Vitamin (THERA) TABS Take 1 tablet by mouth daily.     nitroGLYCERIN (NITROSTAT) 0.4 MG SL tablet Place 1 tablet (0.4 mg total) under the  tongue every 5 (five) minutes as needed for chest pain (X3 DOSES BEFORE CALLING 911). 25 tablet 6   Omega-3 Fatty Acids (FISH OIL) 1200 MG CAPS Take 2,400 mg by mouth daily.     pantoprazole (PROTONIX) 40 MG tablet TAKE 1 TABLET BY MOUTH EVERY DAY 90 tablet 3   sildenafil (VIAGRA) 50 MG tablet Take 1 tablet (50 mg total) by mouth daily as needed for erectile dysfunction. 10 tablet 11   Sodium Sulfate-Mag Sulfate-KCl (SUTAB) [phone] MG TABS Use as directed for colonoscopy. MANUFACTURER CODES!! BIN: [id number] PCN: CN GROUP: [id number] MEMBER ID: [id number];VHQ AS SECONDARY INSURANCE ;NO PRIOR AUTHORIZATION 24 tablet 0   valsartan-hydrochlorothiazide (DIOVAN-HCT) 160-12.5 MG tablet TAKE 1 TABLET BY MOUTH EVERY DAY 90 tablet 3   zinc gluconate 50 MG tablet Take 50 mg by mouth daily.     No facility-administered medications prior to visit.   Allergies  Allergen Reactions   Peanut-Containing Drug Products Other (See Comments)    Pine nuts only.   Codeine Palpitations    Increases heart rate, rapid heart rate   Niacin Other (See Comments)    unknown   Penicillins Other (See Comments)    Does not know what reaction - happened as a child Was told not to take as a child.   Rosuvastatin Other (See Comments)    Night sweats   Objective:   Today's Vitals   04/16/23 0840  BP: 124/78  Pulse: 84  Temp: 97.8 F (36.6 C)  TempSrc: Temporal  SpO2: 100%  Weight: 176 lb 6.4 oz (80 kg)  Height: 5\' 10"  (1.778 m)   Body mass index is 25.31 kg/m.   General: Well developed, well nourished. No acute distress. Feet- Skin intact. No sign of maceration between toes. Nails are normal. Dorsalis pedis and posterior   tibial artery pulses are normal. 5.07 monofilament testing normal. Psych: Alert and oriented. Normal mood and affect.  Health Maintenance Due  Topic Date Due   Pneumococcal Vaccine 48-42 Years old (1 of 2 - PCV) Never done   OPHTHALMOLOGY EXAM  07/03/2022   Zoster Vaccines- Shingrix (2  of 2) 01/16/2023   DTaP/Tdap/Td (2 - Tdap) 03/19/2023     Assessment & Plan:   Problem List Items Addressed This Visit       Cardiovascular and Mediastinum   CAD (coronary artery disease), native coronary artery   Stable. No angina. Continue metoprolol succinate 50 mg daily, aspirin 81 mg daily and clopidogrel 75 mg daily.      Essential hypertension - Primary   Blood pressure is in good control. Continue metoprolol succinate 50 mg daily and Diovan-HCT 160-12.5 mg daily.        Endocrine  Type 2 diabetes mellitus without complication (HCC)   We will check an A1c today. Continue metformin 500 mg daily and dapagliflozin (Farxiga) 5 mg daily.       Relevant Orders   Glucose, random   Hemoglobin A1c     Genitourinary   Chronic kidney disease, stage 3a (HCC)   Labs have consistently shown a GFR in the 50s. Continue focus on blood pressure and glucose control, adequate hydration, and avoidance of nephrotoxic medications. I will screen for secondary hyperparathyroidism.       Relevant Orders   Parathyroid hormone, intact (no Ca)   Phosphorus     Other   Hyperlipidemia   Lipids at goal. Continue atorvastatin 40 mg daily.       Return in about 3 months (around 07/15/2023) for Reassessment.   Loyola Mast, MD

## 2023-04-16 NOTE — Assessment & Plan Note (Signed)
Labs have consistently shown a GFR in the 50s. Continue focus on blood pressure and glucose control, adequate hydration, and avoidance of nephrotoxic medications. I will screen for secondary hyperparathyroidism.

## 2023-04-16 NOTE — Assessment & Plan Note (Signed)
Stable. No angina. Continue metoprolol succinate 50 mg daily, aspirin 81 mg daily and clopidogrel 75 mg daily.

## 2023-04-17 LAB — PARATHYROID HORMONE, INTACT (NO CA): PTH: 10 pg/mL — ABNORMAL LOW (ref 16–77)

## 2023-04-23 DIAGNOSIS — N3281 Overactive bladder: Secondary | ICD-10-CM | POA: Diagnosis not present

## 2023-04-23 DIAGNOSIS — N401 Enlarged prostate with lower urinary tract symptoms: Secondary | ICD-10-CM | POA: Diagnosis not present

## 2023-04-23 DIAGNOSIS — N529 Male erectile dysfunction, unspecified: Secondary | ICD-10-CM | POA: Diagnosis not present

## 2023-05-25 ENCOUNTER — Other Ambulatory Visit: Payer: Self-pay | Admitting: Family Medicine

## 2023-05-25 DIAGNOSIS — F419 Anxiety disorder, unspecified: Secondary | ICD-10-CM

## 2023-07-03 LAB — OPHTHALMOLOGY REPORT-SCANNED

## 2023-07-14 NOTE — Progress Notes (Signed)
 Cardiology Office Note:   Date:  07/18/2023  ID:  Lajean Pike, DOB [birth year], MRN [id number] PCP:  Graig Lawyer, MD  [facility] Providers Cardiologist:  Alyssa Backbone, MD Referring MD: Graig Lawyer, MD  Chief Complaint/Reason for Referral:  CAD follow up ASSESSMENT:    1. Acute ST elevation myocardial infarction (STEMI) involving right coronary artery (HCC)   2. Type 2 diabetes mellitus with complication, without long-term current use of insulin (HCC)   3. Hypertension associated with diabetes (HCC)   4. Hyperlipidemia associated with type 2 diabetes mellitus (HCC)   5. CKD stage 3 due to type 2 diabetes mellitus (HCC)   6. BMI 25.0-25.9,adult   7. Daytime somnolence     PLAN:   In order of problems listed above: History of acute coronary syndrome: Given complex bifurcation stenting will continue indefinite DAPT with aspirin  81 mg and Plavix  75 mg.  Given high-grade OM disease will continue Toprol  XL 50 mg daily.  Continue atorvastatin  40 mg daily.  Continue as needed nitroglycerin .  Will obtain echocardiogram.  Given the fact the patient has issues with erectile dysfunction will likely stop Toprol  if ejection fraction is normal. T2DM: Continue aspirin  81 mg, atorvastatin  40 mg, Farxiga  5 mg, and valsartan /hydrochlorothiazide  160 x 12.5 mg daily. Hypertension: Continue valsartan /hydrochlorothiazide  160 x 12.5 mg daily, Toprol  50 mg daily; BP well-controlled today. Hyperlipidemia: Check lipid panel, LFTs, and LP(a) today.  Given history of ACS goal LDL is less than 55. CKD stage II: Continue valsartan /hydrochlorothiazide  160 x 12.5 mg daily and Farxiga  5 mg daily. Elevated BMI: Will refer to pharmacy for recommendations regarding GLP-1 receptor agonist therapy. Daytime somnolence: Will obtain sleep study.  If he does have sleep apnea which I highly suspect and he is started on GLP-1 receptor agonist is quite possible that the apnea may resolve.            Dispo:   Return in about 6 months (around 01/18/2024).      Medication Adjustments/Labs and Tests Ordered: Current medicines are reviewed at length with the patient today.  Concerns regarding medicines are outlined above.  The following changes have been made:  no change   Labs/tests ordered: Orders Placed This Encounter  Procedures   Lipid panel   Lipoprotein A (LPA)   Hepatic function panel   AMB Referral to [facility] Pharm-D   EKG 12-Lead   ECHOCARDIOGRAM COMPLETE   Split night study    Medication Changes: No orders of the defined types were placed in this encounter.   Current medicines are reviewed at length with the patient today.  The patient does not have concerns regarding medicines.  I spent 33 minutes reviewing all clinical data during and prior to this visit including all relevant imaging studies, laboratories, clinical information from other health systems and prior notes from both Cardiology and other specialties, interviewing the patient, conducting a complete physical examination, and coordinating care in order to formulate a comprehensive and personalized evaluation and treatment plan.   History of Present Illness:      FOCUSED PROBLEM LIST:   Inferior STEMI 2016 Status post culotte bifurcation PCI distal RCA and RPLV On extended duration DAPT High-grade OM1 disease >> med Rx T2DM Not on insulin Hyperlipidemia LDL goal < 55 (ACS) Hypertension CKD stage IIIa BMI 10 Aug 2023: The patient returns for routine follow-up.  He was seen last year and was doing well.  His blood pressure was well-controlled.    He has been  experiencing significant fatigue and excessive daytime sleepiness for about a year, particularly in the afternoons. He feels extremely drowsy after lunch, impacting his ability to perform daily activities, including assisting his wife with household tasks. He also experiences tiredness while driving home, which he finds concerning.  He has a history  of loud snoring at night, as noted by his wife, and feels particularly tired in the late afternoon. He wakes up early in the morning feeling fine but experiences a significant drop in energy post-lunch. No chest pain, shortness of breath, or palpitations.  He reports frequent urination at night, approximately three times, and has been consulting a urologist for this issue. He takes all his medications in the morning, including Farxiga , which can increase urination. He has tried a medication for urinary symptoms in the past but experienced severe dizziness, leading him to discontinue it.  He has a history of smoking but has since quit. 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         Current Medications: Current Meds  Medication Sig   ALPRAZolam  (XANAX ) 0.5 MG tablet TAKE 1/2 TO 1 TABLET BY MOUTH TWICE A DAY AS NEEDED FOR ANXIETY   Ascorbic Acid (VITAMIN C) 1000 MG tablet Take 1,000 mg by mouth daily.   aspirin  81 MG tablet Take 81 mg by mouth daily.    atorvastatin  (LIPITOR ) 40 MG tablet TAKE 1 TABLET BY MOUTH EVERY DAY   Cinnamon 500 MG TABS Take 3 tablets by mouth daily.   clopidogrel  (PLAVIX ) 75 MG tablet TAKE 1 TABLET BY MOUTH EVERY DAY   ezetimibe  (ZETIA ) 10 MG tablet TAKE 1 TABLET BY MOUTH EVERY DAY   FARXIGA  5 MG TABS tablet TAKE 1 TABLET (5 MG TOTAL) BY MOUTH DAILY.   fenofibrate  160 MG tablet TAKE 1 TABLET BY MOUTH EVERY DAY   magnesium oxide (MAG-OX) 400 MG tablet Take 400 mg by mouth daily.   metFORMIN  (GLUCOPHAGE ) 500 MG tablet Take 1 tablet (500 mg total) by mouth 2 (two) times daily with a meal.   metoprolol  succinate (TOPROL -XL) 50 MG 24 hr tablet TAKE 1 TABLET BY MOUTH EVERY DAY WITH OR IMMEDIATELY FOLLOWING A MEAL   Multiple Vitamin (THERA) TABS Take 1 tablet by mouth daily.   nitroGLYCERIN  (NITROSTAT ) 0.4 MG SL tablet Place 1 tablet (0.4 mg total) under the tongue every 5 (five) minutes as needed for chest pain (X3 DOSES BEFORE  CALLING 911).   Omega-3 Fatty Acids (FISH OIL) 1200 MG CAPS Take 2,400 mg by mouth daily.   pantoprazole  (PROTONIX ) 40 MG tablet TAKE 1 TABLET BY MOUTH EVERY DAY   sildenafil  (VIAGRA ) 50 MG tablet Take 1 tablet (50 mg total) by mouth daily as needed for erectile dysfunction.   Sodium Sulfate-Mag Sulfate-KCl (SUTAB ) [phone] MG TABS Use as directed for colonoscopy. MANUFACTURER CODES!! BIN: [id number] PCN: CN GROUP: [id number] MEMBER ID: [id number];OAC AS SECONDARY INSURANCE ;NO PRIOR AUTHORIZATION   valsartan -hydrochlorothiazide  (DIOVAN -HCT) 160-12.5 MG tablet TAKE 1 TABLET BY MOUTH EVERY DAY   zinc gluconate 50 MG tablet Take 50 mg by mouth daily.     Review of Systems:   Please see the history of present illness.    All other systems reviewed and are negative.     EKGs/Labs/Other Test Reviewed:   EKG: 2024 sinus rhythm with inferior infarction pattern  EKG Interpretation Date/Time:  Friday Jul 18 2023 15:17:12 EDT Ventricular Rate:  81 PR Interval:  182 QRS Duration:  80 QT Interval:  374 QTC Calculation: 434 R Axis:   40  Text Interpretation: Normal sinus rhythm Normal ECG When compared with ECG of 30-Nov-2014 06:48, Criteria for Inferior infarct are no longer Present T wave inversion no longer evident in Inferior leads Confirmed by Alyssa Backbone ([id number]) on 07/18/2023 3:20:04 PM         Risk Assessment/Calculations:          Physical Exam:   VS:  BP 110/73   Pulse 81   Ht 5\' 10"  (1.778 m)   Wt 172 lb 6.4 oz (78.2 kg)   SpO2 93%   BMI 24.74 kg/m        Wt Readings from Last 3 Encounters:  07/18/23 172 lb 6.4 oz (78.2 kg)  04/16/23 176 lb 6.4 oz (80 kg)  02/05/23 176 lb (79.8 kg)      GENERAL:  No apparent distress, AOx3 HEENT:  No carotid bruits, +2 carotid impulses, no scleral icterus CAR: RRR no murmurs, gallops, rubs, or thrills RES:  Clear to auscultation bilaterally ABD:  Soft, nontender, nondistended, positive bowel sounds x 4 VASC:  +2 radial pulses, +2  carotid pulses NEURO:  CN 2-12 grossly intact; motor and sensory grossly intact PSYCH:  No active depression or anxiety EXT:  No edema, ecchymosis, or cyanosis  Signed, Cavan Bearden K Kavari Parrillo, MD  07/18/2023 4:00 PM    [facility] [address] [place], [place], [place]  [address] Phone: [phone]; Fax: [phone]   Note:  This document was prepared using Dragon voice recognition software and may include unintentional dictation errors.

## 2023-07-18 ENCOUNTER — Ambulatory Visit: Payer: BC Managed Care – PPO | Attending: Cardiology | Admitting: Internal Medicine

## 2023-07-18 ENCOUNTER — Encounter: Payer: Self-pay | Admitting: Internal Medicine

## 2023-07-18 VITALS — BP 110/73 | HR 81 | Ht 70.0 in | Wt 172.4 lb

## 2023-07-18 DIAGNOSIS — R400 Somnolence: Secondary | ICD-10-CM

## 2023-07-18 DIAGNOSIS — E118 Type 2 diabetes mellitus with unspecified complications: Secondary | ICD-10-CM

## 2023-07-18 DIAGNOSIS — E1169 Type 2 diabetes mellitus with other specified complication: Secondary | ICD-10-CM | POA: Diagnosis not present

## 2023-07-18 DIAGNOSIS — E785 Hyperlipidemia, unspecified: Secondary | ICD-10-CM | POA: Diagnosis not present

## 2023-07-18 DIAGNOSIS — E1122 Type 2 diabetes mellitus with diabetic chronic kidney disease: Secondary | ICD-10-CM | POA: Diagnosis not present

## 2023-07-18 DIAGNOSIS — E1159 Type 2 diabetes mellitus with other circulatory complications: Secondary | ICD-10-CM | POA: Diagnosis not present

## 2023-07-18 DIAGNOSIS — Z6825 Body mass index (BMI) 25.0-25.9, adult: Secondary | ICD-10-CM

## 2023-07-18 DIAGNOSIS — I2111 ST elevation (STEMI) myocardial infarction involving right coronary artery: Secondary | ICD-10-CM

## 2023-07-18 DIAGNOSIS — N183 Chronic kidney disease, stage 3 unspecified: Secondary | ICD-10-CM

## 2023-07-18 DIAGNOSIS — I152 Hypertension secondary to endocrine disorders: Secondary | ICD-10-CM

## 2023-07-18 NOTE — Patient Instructions (Signed)
 Medication Instructions:  No changes today *If you need a refill on your cardiac medications before your next appointment, please call your pharmacy*  Lab Work: Today: lipids, liver function, lp(a)  If you have labs (blood work) drawn today and your tests are completely normal, you will receive your results only by: MyChart Message (if you have MyChart) OR A paper copy in the mail If you have any lab test that is abnormal or we need to change your treatment, we will call you to review the results.  Testing/Procedures: Your physician has recommended that you have a sleep study. This test records several body functions during sleep, including: brain activity, eye movement, oxygen and carbon dioxide blood levels, heart rate and rhythm, breathing rate and rhythm, the flow of air through your mouth and nose, snoring, body muscle movements, and chest and belly movement.  Your physician has requested that you have an echocardiogram. Echocardiography is a painless test that uses sound waves to create images of your heart. It provides your doctor with information about the size and shape of your heart and how well your heart's chambers and valves are working. This procedure takes approximately one hour. There are no restrictions for this procedure. Please do NOT wear cologne, perfume, aftershave, or lotions (deodorant is allowed). Please arrive 15 minutes prior to your appointment time.  Please note: We ask at that you not bring children with you during ultrasound (echo/ vascular) testing. Due to room size and safety concerns, children are not allowed in the ultrasound rooms during exams. Our front office staff cannot provide observation of children in our lobby area while testing is being conducted. An adult accompanying a patient to their appointment will only be allowed in the ultrasound room at the discretion of the ultrasound technician under special circumstances. We apologize for any  inconvenience.   Follow-Up: At [facility], you and your health needs are our priority.  As part of our continuing mission to provide you with exceptional heart care, our providers are all part of one team.  This team includes your primary Cardiologist (physician) and Advanced Practice Providers or APPs (Physician Assistants and Nurse Practitioners) who all work together to provide you with the care you need, when you need it.  Your next appointment:   6 month(s)  Provider:   Arun K Thukkani, MD     Other Instructions You have been referred to clinical pharmacy team for elevated BMI/weight managment

## 2023-07-19 LAB — LIPID PANEL
Chol/HDL Ratio: 3.1 ratio (ref 0.0–5.0)
Cholesterol, Total: 114 mg/dL (ref 100–199)
HDL: 37 mg/dL — ABNORMAL LOW (ref >39)
LDL Chol Calc (NIH): 41 mg/dL (ref 0–99)
Triglycerides: 226 mg/dL — ABNORMAL HIGH (ref 0–149)
VLDL Cholesterol Cal: 36 mg/dL (ref 5–40)

## 2023-07-19 LAB — LIPOPROTEIN A (LPA): Lipoprotein (a): <8.4 nmol/L (ref <75.0)

## 2023-07-19 LAB — HEPATIC FUNCTION PANEL
ALT: 35 IU/L (ref 0–44)
AST: 25 IU/L (ref 0–40)
Albumin: 4.8 g/dL (ref 3.8–4.9)
Alkaline Phosphatase: 32 IU/L — ABNORMAL LOW (ref 44–121)
Bilirubin Total: 0.2 mg/dL (ref 0.0–1.2)
Bilirubin, Direct: 0.14 mg/dL (ref 0.00–0.40)
Total Protein: 7.3 g/dL (ref 6.0–8.5)

## 2023-07-20 ENCOUNTER — Encounter: Payer: Self-pay | Admitting: Internal Medicine

## 2023-08-13 DIAGNOSIS — E291 Testicular hypofunction: Secondary | ICD-10-CM | POA: Diagnosis not present

## 2023-08-13 DIAGNOSIS — N400 Enlarged prostate without lower urinary tract symptoms: Secondary | ICD-10-CM | POA: Diagnosis not present

## 2023-08-13 DIAGNOSIS — Z1329 Encounter for screening for other suspected endocrine disorder: Secondary | ICD-10-CM | POA: Diagnosis not present

## 2023-08-20 ENCOUNTER — Other Ambulatory Visit: Payer: Self-pay | Admitting: Family Medicine

## 2023-08-21 ENCOUNTER — Ambulatory Visit (HOSPITAL_COMMUNITY)
Admission: RE | Admit: 2023-08-21 | Discharge: 2023-08-21 | Disposition: A | Discharge status: Home / Self Care | Source: Ambulatory Visit | Attending: Internal Medicine | Admitting: Internal Medicine

## 2023-08-21 DIAGNOSIS — I2111 ST elevation (STEMI) myocardial infarction involving right coronary artery: Secondary | ICD-10-CM

## 2023-08-21 LAB — ECHOCARDIOGRAM COMPLETE
Area-P 1/2: 3.48 cm2
S' Lateral: 2.73 cm

## 2023-08-22 ENCOUNTER — Ambulatory Visit: Payer: Self-pay | Admitting: Internal Medicine

## 2023-08-25 DIAGNOSIS — R6882 Decreased libido: Secondary | ICD-10-CM | POA: Diagnosis not present

## 2023-08-25 DIAGNOSIS — R4589 Other symptoms and signs involving emotional state: Secondary | ICD-10-CM | POA: Diagnosis not present

## 2023-08-25 DIAGNOSIS — E291 Testicular hypofunction: Secondary | ICD-10-CM | POA: Diagnosis not present

## 2023-08-25 DIAGNOSIS — F419 Anxiety disorder, unspecified: Secondary | ICD-10-CM | POA: Diagnosis not present

## 2023-09-01 ENCOUNTER — Ambulatory Visit: Admitting: Dermatology

## 2023-09-08 LAB — OPHTHALMOLOGY REPORT-SCANNED

## 2023-09-11 ENCOUNTER — Other Ambulatory Visit: Payer: Self-pay | Admitting: Family Medicine

## 2023-09-11 DIAGNOSIS — F419 Anxiety disorder, unspecified: Secondary | ICD-10-CM

## 2023-09-11 NOTE — Telephone Encounter (Signed)
 Refill request for  Alprazolam  0.5 mg LR  05/26/23, # 45, 1 rf LOV  04/16/23 FOV  none scheduled.   Please review and advise.  Thanks. Dm/cma

## 2023-09-15 ENCOUNTER — Ambulatory Visit: Admitting: Pharmacist

## 2023-09-25 ENCOUNTER — Ambulatory Visit: Payer: BC Managed Care – PPO | Admitting: Dermatology

## 2023-10-09 ENCOUNTER — Other Ambulatory Visit: Payer: Self-pay | Admitting: Family Medicine

## 2023-10-09 DIAGNOSIS — E782 Mixed hyperlipidemia: Secondary | ICD-10-CM

## 2023-10-09 DIAGNOSIS — K219 Gastro-esophageal reflux disease without esophagitis: Secondary | ICD-10-CM

## 2023-10-09 DIAGNOSIS — I10 Essential (primary) hypertension: Secondary | ICD-10-CM

## 2023-10-11 LAB — HM DIABETES EYE EXAM

## 2023-10-12 ENCOUNTER — Other Ambulatory Visit: Payer: Self-pay | Admitting: Family Medicine

## 2023-10-12 DIAGNOSIS — E119 Type 2 diabetes mellitus without complications: Secondary | ICD-10-CM

## 2023-10-16 ENCOUNTER — Encounter: Payer: Self-pay | Admitting: Dermatology

## 2023-10-16 ENCOUNTER — Ambulatory Visit: Admitting: Dermatology

## 2023-10-16 VITALS — BP 168/109

## 2023-10-16 DIAGNOSIS — L578 Other skin changes due to chronic exposure to nonionizing radiation: Secondary | ICD-10-CM

## 2023-10-16 DIAGNOSIS — B079 Viral wart, unspecified: Secondary | ICD-10-CM

## 2023-10-16 DIAGNOSIS — L814 Other melanin hyperpigmentation: Secondary | ICD-10-CM | POA: Diagnosis not present

## 2023-10-16 DIAGNOSIS — W908XXA Exposure to other nonionizing radiation, initial encounter: Secondary | ICD-10-CM

## 2023-10-16 DIAGNOSIS — Z1283 Encounter for screening for malignant neoplasm of skin: Secondary | ICD-10-CM | POA: Diagnosis not present

## 2023-10-16 DIAGNOSIS — D239 Other benign neoplasm of skin, unspecified: Secondary | ICD-10-CM

## 2023-10-16 DIAGNOSIS — L821 Other seborrheic keratosis: Secondary | ICD-10-CM | POA: Diagnosis not present

## 2023-10-16 DIAGNOSIS — D1801 Hemangioma of skin and subcutaneous tissue: Secondary | ICD-10-CM | POA: Diagnosis not present

## 2023-10-16 DIAGNOSIS — D229 Melanocytic nevi, unspecified: Secondary | ICD-10-CM

## 2023-10-16 NOTE — Patient Instructions (Addendum)
 Date: Thu Oct 16 2023  Hello Mr. Carlos Montgomery,  Thank you for visiting today. Here is a summary of the key instructions:  - Skin Care:   - Apply Aquaphor to the treated area on your eye before bed and when you wake up   - The treated area may feel stingy and crusty for a few days   - The crust should fall off within 2 weeks  - Sun Protection:   - Use sunscreen on your lips daily   - Choose a lip balm with SPF 30 or higher   - Sun Bum brand is recommended for lip protection   - Reapply sunscreen when at the beach  - Follow-up:   - Return for a skin check in 1 year  Please reach out if you have any questions or concerns.  Warm regards,  Dr. Delon Lenis, Dermatology     Cryotherapy Aftercare  Wash gently with soap and water everyday.   Apply Vaseline and Band-Aid daily until healed.    Important Information  Due to recent changes in healthcare laws, you may see results of your pathology and/or laboratory studies on MyChart before the doctors have had a chance to review them. We understand that in some cases there may be results that are confusing or concerning to you. Please understand that not all results are received at the same time and often the doctors may need to interpret multiple results in order to provide you with the best plan of care or course of treatment. Therefore, we ask that you please give us  2 business days to thoroughly review all your results before contacting the office for clarification. Should we see a critical lab result, you will be contacted sooner.   If You Need Anything After Your Visit  If you have any questions or concerns for your doctor, please call our main line at [phone] If no one answers, please leave a voicemail as directed and we will return your call as soon as possible. Messages left after 4 pm will be answered the following business day.   You may also send us  a message via MyChart. We typically respond to MyChart messages within 1-2  business days.  For prescription refills, please ask your pharmacy to contact our office. Our fax number is [phone].  If you have an urgent issue when the clinic is closed that cannot wait until the next business day, you can page your doctor at the number below.    Please note that while we do our best to be available for urgent issues outside of office hours, we are not available 24/7.   If you have an urgent issue and are unable to reach us , you may choose to seek medical care at your doctor's office, retail clinic, urgent care center, or emergency room.  If you have a medical emergency, please immediately call 911 or go to the emergency department. In the event of inclement weather, please call our main line at [phone] for an update on the status of any delays or closures.  Dermatology Medication Tips: Please keep the boxes that topical medications come in in order to help keep track of the instructions about where and how to use these. Pharmacies typically print the medication instructions only on the boxes and not directly on the medication tubes.   If your medication is too expensive, please contact our office at [phone] or send us  a message through MyChart.   We are unable to tell what your  co-pay for medications will be in advance as this is different depending on your insurance coverage. However, we may be able to find a substitute medication at lower cost or fill out paperwork to get insurance to cover a needed medication.   If a prior authorization is required to get your medication covered by your insurance company, please allow us  1-2 business days to complete this process.  Drug prices often vary depending on where the prescription is filled and some pharmacies may offer cheaper prices.  The website www.goodrx.com contains coupons for medications through different pharmacies. The prices here do not account for what the cost may be with help from insurance (it may  be cheaper with your insurance), but the website can give you the price if you did not use any insurance.  - You can print the associated coupon and take it with your prescription to the pharmacy.  - You may also stop by our office during regular business hours and pick up a GoodRx coupon card.  - If you need your prescription sent electronically to a different pharmacy, notify our office through [facility] or by phone at [phone]

## 2023-10-16 NOTE — Progress Notes (Signed)
   Follow-Up Visit   Subjective  Carlos Montgomery is a 58 y.o. male who presents for the following: Skin Cancer Screening and Full Body Skin Exam  Carlos Montgomery is here for a full body skin check. Complains of two spots on bottom lip. Present for about 4-5 months. Denies itchiness or bleeding. These spots are brown and are not raised. No hx of skin cancer and no family hx of skin cancer.   The patient presents for Total-Body Skin Exam (TBSE) for skin cancer screening and mole check. The patient has spots, moles and lesions to be evaluated, some may be new or changing and the patient may have concern these could be cancer.    The following portions of the chart were reviewed this encounter and updated as appropriate: medications, allergies, medical history  Review of Systems:  No other skin or systemic complaints except as noted in HPI or Assessment and Plan.  Objective  Well appearing patient in no apparent distress; mood and affect are within normal limits.  A full examination was performed including scalp, head, eyes, ears, nose, lips, neck, chest, axillae, abdomen, back, buttocks, bilateral upper extremities, bilateral lower extremities, hands, feet, fingers, toes, fingernails, and toenails. All findings within normal limits unless otherwise noted below.   Relevant physical exam findings are noted in the Assessment and Plan.  Left Upper Eyelid 3mm verrucous papule   Assessment & Plan   SKIN CANCER SCREENING PERFORMED TODAY.  ACTINIC DAMAGE - Chronic condition, secondary to cumulative UV/sun exposure - diffuse scaly erythematous macules with underlying dyspigmentation - Recommend daily broad spectrum sunscreen SPF 30+ to sun-exposed areas, reapply every 2 hours as needed.  - Staying in the shade or wearing long sleeves, sun glasses (UVA+UVB protection) and wide brim hats (4-inch brim around the entire circumference of the hat) are also recommended for sun protection.  - Call for new or  changing lesions.  LENTIGINES, SEBORRHEIC KERATOSES, HEMANGIOMAS - Benign normal skin lesions - Benign-appearing - Call for any changes  MELANOCYTIC NEVI - Tan-brown and/or pink-flesh-colored symmetric macules and papules - Benign appearing on exam today - Observation - Call clinic for new or changing moles - Recommend daily use of broad spectrum spf 30+ sunscreen to sun-exposed areas.   DERMATOFIBROMA Exam: Firm pink/brown papulenodule with dimple sign.  Treatment Plan: A dermatofibroma is a benign growth possibly related to trauma, such as an insect bite, cut from shaving, or inflamed acne-type bump.  Treatment options to remove include shave or excision with resulting scar and risk of recurrence.  Since benign-appearing and not bothersome, will observe for now.     VIRAL WARTS, UNSPECIFIED TYPE Left Upper Eyelid Destruction of lesion - Left Upper Eyelid  Destruction method: cryotherapy   Informed consent: discussed and consent obtained   Lesion destroyed using liquid nitrogen: Yes   Outcome: patient tolerated procedure well with no complications   Post-procedure details: wound care instructions given    No follow-ups on file.  I, Gordan Beams, CMA, am acting as scribe for [employer], DO.   Documentation: I have reviewed the above documentation for accuracy and completeness, and I agree with the above.  Delon Lenis, DO

## 2023-10-23 ENCOUNTER — Encounter: Payer: Self-pay | Admitting: Family Medicine

## 2023-10-23 ENCOUNTER — Ambulatory Visit: Admitting: Family Medicine

## 2023-10-23 VITALS — BP 122/78 | HR 91 | Temp 97.4°F | Ht 70.0 in | Wt 168.4 lb

## 2023-10-23 DIAGNOSIS — I2510 Atherosclerotic heart disease of native coronary artery without angina pectoris: Secondary | ICD-10-CM | POA: Diagnosis not present

## 2023-10-23 DIAGNOSIS — I10 Essential (primary) hypertension: Secondary | ICD-10-CM

## 2023-10-23 DIAGNOSIS — E119 Type 2 diabetes mellitus without complications: Secondary | ICD-10-CM | POA: Diagnosis not present

## 2023-10-23 DIAGNOSIS — Z7984 Long term (current) use of oral hypoglycemic drugs: Secondary | ICD-10-CM

## 2023-10-23 DIAGNOSIS — Z23 Encounter for immunization: Secondary | ICD-10-CM

## 2023-10-23 DIAGNOSIS — N486 Induration penis plastica: Secondary | ICD-10-CM

## 2023-10-23 DIAGNOSIS — E782 Mixed hyperlipidemia: Secondary | ICD-10-CM

## 2023-10-23 DIAGNOSIS — N1831 Chronic kidney disease, stage 3a: Secondary | ICD-10-CM

## 2023-10-23 DIAGNOSIS — N529 Male erectile dysfunction, unspecified: Secondary | ICD-10-CM

## 2023-10-23 LAB — LIPID PANEL
Cholesterol: 96 mg/dL (ref 0–200)
HDL: 38.6 mg/dL — ABNORMAL LOW (ref >39.00)
LDL Cholesterol: 41 mg/dL (ref 0–99)
NonHDL: 57.01
Total CHOL/HDL Ratio: 2
Triglycerides: 81 mg/dL (ref 0.0–149.0)
VLDL: 16.2 mg/dL (ref 0.0–40.0)

## 2023-10-23 LAB — URINALYSIS, ROUTINE W REFLEX MICROSCOPIC
Bilirubin Urine: NEGATIVE
Hgb urine dipstick: NEGATIVE
Ketones, ur: NEGATIVE
Leukocytes,Ua: NEGATIVE
Nitrite: NEGATIVE
RBC / HPF: NONE SEEN (ref 0–?)
Specific Gravity, Urine: 1.01 (ref 1.000–1.030)
Urine Glucose: NEGATIVE
Urobilinogen, UA: 0.2 (ref 0.0–1.0)
pH: 7 (ref 5.0–8.0)

## 2023-10-23 LAB — BASIC METABOLIC PANEL WITH GFR
BUN: 13 mg/dL (ref 6–23)
CO2: 26 meq/L (ref 19–32)
Calcium: 10.1 mg/dL (ref 8.4–10.5)
Chloride: 95 meq/L — ABNORMAL LOW (ref 96–112)
Creatinine, Ser: 1.29 mg/dL (ref 0.40–1.50)
GFR: 62.15 mL/min (ref >60.00)
Glucose, Bld: 117 mg/dL — ABNORMAL HIGH (ref 70–99)
Potassium: 4.5 meq/L (ref 3.5–5.1)
Sodium: 135 meq/L (ref 135–145)

## 2023-10-23 LAB — HEMOGLOBIN A1C: Hgb A1c MFr Bld: 6.6 % — ABNORMAL HIGH (ref 4.6–6.5)

## 2023-10-23 LAB — MICROALBUMIN / CREATININE URINE RATIO
Creatinine,U: 143 mg/dL
Microalb Creat Ratio: 35.7 mg/g — ABNORMAL HIGH (ref 0.0–30.0)
Microalb, Ur: 5.1 mg/dL — ABNORMAL HIGH (ref 0.0–1.9)

## 2023-10-23 MED ORDER — METOPROLOL SUCCINATE ER 50 MG PO TB24
50.0000 mg | ORAL_TABLET | Freq: Every day | ORAL | 3 refills | Status: AC
Start: 2023-10-23 — End: ?

## 2023-10-23 NOTE — Assessment & Plan Note (Signed)
 Continue sildenafil . Follow-up with new urologist.

## 2023-10-23 NOTE — Assessment & Plan Note (Signed)
 We will check annual DM labs today. Continue metformin  500 mg twice daily and dapagliflozin  (Farxiga ) 5 mg daily.

## 2023-10-23 NOTE — Assessment & Plan Note (Signed)
 I will reassess lipids today. . Continue atorvastatin  40 mg daily, ezetimibe  10 mg daily, fenofibrate  160 mg daily, and fish oil.

## 2023-10-23 NOTE — Progress Notes (Signed)
 [facility] [address] [place] [address] Dept: [phone] Dept Fax: [phone]  Chronic Care Office Visit  Subjective:    Patient ID: Carlos Montgomery, male    DOB: 1965-11-01, 58 y.o..   MRN: [id number]  Chief Complaint  Patient presents with   Hypertension    F/u HTN/meds.      History of Present Illness:  Patient is in today for reassessment of chronic medical issues.  Carlos Montgomery has a history of Type 2 diabetes. He had been managed on metformin  500 mg twice daily and dapagliflozin  (Farxiga ) 5 mg daily (has not tolerated higher dosage).     Carlos Montgomery has a history of hypertension. He is managed on valsartan -HCTZ (Diovan -HCT) 160-12.5 mg daily. He had been on metoprolol  succinate 50 mg daily. He is cardiologist stopped this, as he had a normal echocardiogram. However, Carlos Montgomery feels he has had more BP variability since then and has times where he feels he is having palpitations. He asks about resuming this.   Carlos Montgomery has a history of CAD and is s/p inferior STEMI in 2016. He had two RCA stents placed. He is currently on aspirin  81 mg daily and clopidogrel  75 mg daily.    Carlos Montgomery has a history of hyperlipidemia and is managed on atorvastatin  40 mg daily, ezetimibe  10 mg daily, and fenofibrate  160 mg daily.  Past Medical History: Patient Active Problem List   Diagnosis Date Noted   Chronic kidney disease, stage 3a (HCC) 04/16/2023   Ocular trauma of right eye 03/06/2020   History of inferior wall myocardial infarction 01/19/2015   Erectile dysfunction 01/19/2015   CAD (coronary artery disease), native coronary artery 01/19/2015   Type 2 diabetes mellitus without complication (HCC) 12/16/2013   Hepatic steatosis 06/13/2010   Retinal vascular occlusion 05/23/2010   History of colon polyps 11/15/2007   Peyronie's disease 11/15/2007   Hyperlipidemia 11/11/2007   Anxiety 11/11/2007   Essential hypertension  11/11/2007   GERD 11/11/2007   Past Surgical History:  Procedure Laterality Date   broken wrists     CARDIAC CATHETERIZATION N/A 11/29/2014   Procedure: Left Heart Cath and Coronary Angiography;  Surgeon: Candyce GORMAN Reek, MD;  Location: [facility] INVASIVE CV LAB;  Service: Cardiovascular;  Laterality: N/A;   CARDIAC CATHETERIZATION N/A 11/29/2014   Procedure: Coronary Stent Intervention;  Surgeon: Candyce GORMAN Reek, MD;  Location: [facility] INVASIVE CV LAB;  Service: Cardiovascular;  Laterality: N/A;   RETINAL DETACHMENT SURGERY     RHINOPLASTY     TONSILLECTOMY AND ADENOIDECTOMY     Family History  Problem Relation Age of Onset   Heart disease Father    Hyperlipidemia Father    Heart attack Maternal Grandfather 18   Heart attack Paternal Grandfather 24   Colon cancer Neg Hx    Pancreatic cancer Neg Hx    Stomach cancer Neg Hx    Hypertension Neg Hx    Stroke Neg Hx    Outpatient Medications Prior to Visit  Medication Sig Dispense Refill   ALPRAZolam  (XANAX ) 0.5 MG tablet TAKE 1/2 TO 1 TABLET BY MOUTH TWICE A DAY AS NEEDED FOR ANXIETY 45 tablet 1   Ascorbic Acid (VITAMIN C) 1000 MG tablet Take 1,000 mg by mouth daily.     aspirin  81 MG tablet Take 81 mg by mouth daily.      atorvastatin  (LIPITOR ) 40 MG tablet TAKE 1 TABLET BY MOUTH EVERY DAY 90 tablet 3   Cinnamon 500  MG TABS Take 3 tablets by mouth daily.     clopidogrel  (PLAVIX ) 75 MG tablet TAKE 1 TABLET BY MOUTH EVERY DAY 90 tablet 3   dapagliflozin  propanediol (FARXIGA ) 5 MG TABS tablet TAKE 1 TABLET (5 MG TOTAL) BY MOUTH DAILY. 90 tablet 2   ezetimibe  (ZETIA ) 10 MG tablet TAKE 1 TABLET BY MOUTH EVERY DAY 90 tablet 3   fenofibrate  160 MG tablet TAKE 1 TABLET BY MOUTH EVERY DAY 90 tablet 3   magnesium oxide (MAG-OX) 400 MG tablet Take 400 mg by mouth daily.     metFORMIN  (GLUCOPHAGE ) 500 MG tablet Take 1 tablet (500 mg total) by mouth 2 (two) times daily with a meal. 180 tablet 1   Multiple Vitamin (THERA) TABS Take 1 tablet by mouth  daily.     nitroGLYCERIN  (NITROSTAT ) 0.4 MG SL tablet Place 1 tablet (0.4 mg total) under the tongue every 5 (five) minutes as needed for chest pain (X3 DOSES BEFORE CALLING 911). 25 tablet 6   Omega-3 Fatty Acids (FISH OIL) 1200 MG CAPS Take 2,400 mg by mouth daily.     pantoprazole  (PROTONIX ) 40 MG tablet TAKE 1 TABLET BY MOUTH EVERY DAY 90 tablet 3   sildenafil  (VIAGRA ) 50 MG tablet Take 1 tablet (50 mg total) by mouth daily as needed for erectile dysfunction. 10 tablet 11   Sodium Sulfate-Mag Sulfate-KCl (SUTAB ) 1479-225-188 MG TABS Use as directed for colonoscopy. MANUFACTURER CODES!! BIN: [id number] PCN: CN GROUP: [id number] MEMBER ID: [id number];MLW AS SECONDARY INSURANCE ;NO PRIOR AUTHORIZATION 24 tablet 0   valsartan -hydrochlorothiazide  (DIOVAN -HCT) 160-12.5 MG tablet TAKE 1 TABLET BY MOUTH EVERY DAY 90 tablet 3   zinc gluconate 50 MG tablet Take 50 mg by mouth daily.     No facility-administered medications prior to visit.   Allergies  Allergen Reactions   Peanut-Containing Drug Products Other (See Comments)    Pine nuts only.   Codeine Palpitations    Increases heart rate, rapid heart rate   Niacin Other (See Comments)    unknown   Penicillins Other (See Comments)    Does not know what reaction - happened as a child Was told not to take as a child.   Rosuvastatin Other (See Comments)    Night sweats   Objective:   Today's Vitals   10/23/23 0833  BP: 122/78  Pulse: 91  Temp: (!) 97.4 F (36.3 C)  TempSrc: Temporal  SpO2: 96%  Weight: 168 lb 6.4 oz (76.4 kg)  Height: 5' 10 (1.778 m)   Body mass index is 24.16 kg/m.   General: Well developed, well nourished. No acute distress. Psych: Alert and oriented. Normal mood and affect.  Health Maintenance Due  Topic Date Due   Diabetic kidney evaluation - Urine ACR  Never done   Pneumococcal Vaccine: 19-49 Years (1 of 2 - PCV) Never done   Pneumococcal Vaccine: 50+ Years (1 of 2 - PCV) Never done   Hepatitis B Vaccines (1  of 3 - 19+ 3-dose series) Never done   OPHTHALMOLOGY EXAM  07/03/2022   Zoster Vaccines- Shingrix  (2 of 2) 01/16/2023   DTaP/Tdap/Td (2 - Tdap) 03/19/2023   HEMOGLOBIN A1C  10/14/2023   INFLUENZA VACCINE  10/17/2023   Diabetic kidney evaluation - eGFR measurement  11/20/2023     Assessment & Plan:   Problem List Items Addressed This Visit       Cardiovascular and Mediastinum   CAD (coronary artery disease), native coronary artery - Primary   Stable. No angina. Continue  aspirin  81 mg daily and clopidogrel  75 mg daily.      Relevant Medications   metoprolol  succinate (TOPROL -XL) 50 MG 24 hr tablet   Essential hypertension   Blood pressure is in good control. Continue valsartan - HCTZ (Diovan -HCT) 160-12.5 mg daily. I will resume his metoprolol  succinate 50 mg daily to see if this will manage the BP variability and also prevent his episodes of palpitations.      Relevant Medications   metoprolol  succinate (TOPROL -XL) 50 MG 24 hr tablet     Endocrine   Type 2 diabetes mellitus without complication (HCC)   We will check annual DM labs today. Continue metformin  500 mg twice daily and dapagliflozin  (Farxiga ) 5 mg daily.       Relevant Orders   Microalbumin / creatinine urine ratio   Basic metabolic panel with GFR   Hemoglobin A1c   Urinalysis, Routine w reflex microscopic     Musculoskeletal and Integument   Peyronie's disease   Had been referred to urology, but his urologist stopped practice. He requests to see someone new.      Relevant Orders   Ambulatory referral to Urology     Genitourinary   Chronic kidney disease, stage 3a (HCC)   Labs have consistently shown a GFR in the 50s. Continue focus on blood pressure and glucose control, adequate hydration, and avoidance of nephrotoxic medications.        Other   Erectile dysfunction   Continue sildenafil . Follow-up with new urologist.      Relevant Orders   Ambulatory referral to Urology   Hyperlipidemia   I will  reassess lipids today. . Continue atorvastatin  40 mg daily, ezetimibe  10 mg daily, fenofibrate  160 mg daily, and fish oil.      Relevant Medications   metoprolol  succinate (TOPROL -XL) 50 MG 24 hr tablet   Other Relevant Orders   Lipid panel   Other Visit Diagnoses       Need for Tdap vaccination       Relevant Orders   Tdap vaccine greater than or equal to 7yo IM (Completed)       Return in about 3 months (around 01/23/2024) for Reassessment.   Garnette CHRISTELLA Simpler, MD

## 2023-10-23 NOTE — Assessment & Plan Note (Signed)
 Had been referred to urology, but his urologist stopped practice. He requests to see someone new.

## 2023-10-23 NOTE — Assessment & Plan Note (Signed)
 Stable. No angina. Continue aspirin 81 mg daily and clopidogrel 75 mg daily.

## 2023-10-23 NOTE — Assessment & Plan Note (Signed)
 Labs have consistently shown a GFR in the 50s. Continue focus on blood pressure and glucose control, adequate hydration, and avoidance of nephrotoxic medications.

## 2023-10-23 NOTE — Assessment & Plan Note (Signed)
 Blood pressure is in good control. Continue valsartan - HCTZ (Diovan -HCT) 160-12.5 mg daily. I will resume his metoprolol  succinate 50 mg daily to see if this will manage the BP variability and also prevent his episodes of palpitations.

## 2023-10-24 ENCOUNTER — Ambulatory Visit: Payer: Self-pay | Admitting: Family Medicine

## 2023-11-16 ENCOUNTER — Other Ambulatory Visit: Payer: Self-pay | Admitting: Family Medicine

## 2023-11-16 DIAGNOSIS — E119 Type 2 diabetes mellitus without complications: Secondary | ICD-10-CM

## 2023-11-16 DIAGNOSIS — E782 Mixed hyperlipidemia: Secondary | ICD-10-CM

## 2023-11-20 ENCOUNTER — Ambulatory Visit: Admitting: Urology

## 2023-11-23 NOTE — Progress Notes (Incomplete)
 Chief Complaint: ED  History of Present Illness:  Carlos Montgomery is a 58 y.o. male who is seen in consultation from Thedora Garnette HERO, MD for evaluation of ***.   Past Medical History:  Past Medical History:  Diagnosis Date   CAD (coronary artery disease)    cath 11/29/2014 bifurcation Culotte stent to distal RCA and ost PDA treated with 2.75x28 mm Synergy stent, and ost PLA with 2.5x53mm DES, 90% small OM2   Diabetes mellitus    metformin    Hyperlipemia    Hypertension    Pulmonary nodule 09/06/2010   Tobacco abuse 11/29/2014    Past Surgical History:  Past Surgical History:  Procedure Laterality Date   broken wrists     CARDIAC CATHETERIZATION N/A 11/29/2014   Procedure: Left Heart Cath and Coronary Angiography;  Surgeon: Candyce GORMAN Reek, MD;  Location: [facility] INVASIVE CV LAB;  Service: Cardiovascular;  Laterality: N/A;   CARDIAC CATHETERIZATION N/A 11/29/2014   Procedure: Coronary Stent Intervention;  Surgeon: Candyce GORMAN Reek, MD;  Location: [facility] INVASIVE CV LAB;  Service: Cardiovascular;  Laterality: N/A;   RETINAL DETACHMENT SURGERY     RHINOPLASTY     TONSILLECTOMY AND ADENOIDECTOMY      Allergies:  Allergies  Allergen Reactions   Peanut-Containing Drug Products Other (See Comments)    Pine nuts only.   Codeine Palpitations    Increases heart rate, rapid heart rate   Niacin Other (See Comments)    unknown   Penicillins Other (See Comments)    Does not know what reaction - happened as a child Was told not to take as a child.   Rosuvastatin Other (See Comments)    Night sweats    Family History:  Family History  Problem Relation Age of Onset   Heart disease Father    Hyperlipidemia Father    Heart attack Maternal Grandfather 86   Heart attack Paternal Grandfather 68   Colon cancer Neg Hx    Pancreatic cancer Neg Hx    Stomach cancer Neg Hx    Hypertension Neg Hx    Stroke Neg Hx     Social History:  Social History   Tobacco Use   Smoking  status: Former    Current packs/day: 0.00    Types: Cigarettes    Quit date: 03/18/1998    Years since quitting: 25.7   Smokeless tobacco: Never   Tobacco comments:    smoked <1ppd for 15 yrs & quit ~2000...  Vaping Use   Vaping status: Never Used  Substance Use Topics   Alcohol use: No    Alcohol/week: 0.0 standard drinks of alcohol   Drug use: No    Review of symptoms:  Constitutional:  Negative for unexplained weight loss, night sweats, fever, chills ENT:  Negative for nose bleeds, sinus pain, painful swallowing CV:  Negative for chest pain, shortness of breath, exercise intolerance, palpitations, loss of consciousness Resp:  Negative for cough, wheezing, shortness of breath GI:  Negative for nausea, vomiting, diarrhea, bloody stools GU:  Positives noted in HPI; otherwise negative for gross hematuria, dysuria, urinary incontinence Neuro:  Negative for seizures, poor balance, limb weakness, slurred speech Psych:  Negative for lack of energy, depression, anxiety Endocrine:  Negative for polydipsia, polyuria, symptoms of hypoglycemia (dizziness, hunger, sweating) Hematologic:  Negative for anemia, purpura, petechia, prolonged or excessive bleeding, use of anticoagulants  Allergic:  Negative for difficulty breathing or choking as a result of exposure to anything; no shellfish allergy; no allergic  response (rash/itch) to materials, foods  Physical exam: There were no vitals taken for this visit. GENERAL APPEARANCE:  Well appearing, well developed, well nourished, NAD HEENT: Atraumatic, Normocephalic. NECK: Normal appearance LUNGS: Normal inspiratory and expiratory excursion HEART: Regular Rate ABDOMEN: ***. GU: Phallus normal, no lesions. Scrotal skin normal. Testicles/epididymal structures normal. Meatus normal. Normal anal sphincter tone, prostate ***mL, symmetric, non nodular, non tender. EXTREMITIES: Moves all extremities well.  Without clubbing, cyanosis, or edema. NEUROLOGIC:   Alert and oriented x 3, normal gait, CN II-XII grossly intact.  MENTAL STATUS:  Appropriate. SKIN:  Warm, dry and intact.    Results: No results found for this or any previous visit (from the past 24 hours).  I have reviewed referring/prior physicians notes  I have reviewed urinalysis  I have reviewed PSA results  I have reviewed prior imaging  I have reviewed urine culture results  Assessment: ***   Plan: ***

## 2023-11-24 ENCOUNTER — Ambulatory Visit: Admitting: Urology

## 2023-11-24 DIAGNOSIS — N5201 Erectile dysfunction due to arterial insufficiency: Secondary | ICD-10-CM

## 2023-11-24 DIAGNOSIS — E291 Testicular hypofunction: Secondary | ICD-10-CM | POA: Diagnosis not present

## 2023-11-24 DIAGNOSIS — N400 Enlarged prostate without lower urinary tract symptoms: Secondary | ICD-10-CM | POA: Diagnosis not present

## 2023-11-26 DIAGNOSIS — E291 Testicular hypofunction: Secondary | ICD-10-CM | POA: Diagnosis not present

## 2023-11-26 NOTE — Progress Notes (Signed)
 Chief Complaint: ED, urinary problems  History of Present Illness:  Carlos Montgomery is a 58 y.o. male who is seen in consultation from Thedora Garnette HERO, MD for evaluation of urinary frequency, urgency and nocturia.  Rare urgency incontinence.  Saw a younger physician/urologist in [facility] a year or 2 ago for similar concerns.  He feels like the urologist main concern was his ED, which is a problem but not a significant concern at this time.  Patient has failed Viagra .  His wife is not that interested in intercourse at the present time.  His biggest complaint is nocturia.  IPSS 19/5.  He has not been on any medical therapy.  He does drink 1 or 2 alcoholic beverages in the evening.  He does not drink caffeinated beverages.  The patient is seeing a provider at [facility] and has been started on testosterone  repletion.  He states that his testosterone  level in the morning a while back, before 10:00 was 267.   Past Medical History:  Past Medical History:  Diagnosis Date   CAD (coronary artery disease)    cath 11/29/2014 bifurcation Culotte stent to distal RCA and ost PDA treated with 2.75x28 mm Synergy stent, and ost PLA with 2.5x41mm DES, 90% small OM2   Diabetes mellitus    metformin    Hyperlipemia    Hypertension    Pulmonary nodule 09/06/2010   Tobacco abuse 11/29/2014    Past Surgical History:  Past Surgical History:  Procedure Laterality Date   broken wrists     CARDIAC CATHETERIZATION N/A 11/29/2014   Procedure: Left Heart Cath and Coronary Angiography;  Surgeon: Candyce GORMAN Reek, MD;  Location: [facility] INVASIVE CV LAB;  Service: Cardiovascular;  Laterality: N/A;   CARDIAC CATHETERIZATION N/A 11/29/2014   Procedure: Coronary Stent Intervention;  Surgeon: Candyce GORMAN Reek, MD;  Location: [facility] INVASIVE CV LAB;  Service: Cardiovascular;  Laterality: N/A;   RETINAL DETACHMENT SURGERY     RHINOPLASTY     TONSILLECTOMY AND ADENOIDECTOMY      Allergies:  Allergies  Allergen  Reactions   Peanut-Containing Drug Products Other (See Comments)    Pine nuts only.   Codeine Palpitations    Increases heart rate, rapid heart rate   Niacin Other (See Comments)    unknown   Penicillins Other (See Comments)    Does not know what reaction - happened as a child Was told not to take as a child.   Rosuvastatin Other (See Comments)    Night sweats    Family History:  Family History  Problem Relation Age of Onset   Heart disease Father    Hyperlipidemia Father    Heart attack Maternal Grandfather 51   Heart attack Paternal Grandfather 80   Colon cancer Neg Hx    Pancreatic cancer Neg Hx    Stomach cancer Neg Hx    Hypertension Neg Hx    Stroke Neg Hx     Social History:  Social History   Tobacco Use   Smoking status: Former    Current packs/day: 0.00    Types: Cigarettes    Quit date: 03/18/1998    Years since quitting: 25.7   Smokeless tobacco: Never   Tobacco comments:    smoked <1ppd for 15 yrs & quit ~2000...  Vaping Use   Vaping status: Never Used  Substance Use Topics   Alcohol use: No    Alcohol/week: 0.0 standard drinks of alcohol   Drug use: No    Review  of symptoms:  Constitutional:  Negative for unexplained weight loss, night sweats, fever, chills ENT:  Negative for nose bleeds, sinus pain, painful swallowing CV:  Negative for chest pain, shortness of breath, exercise intolerance, palpitations, loss of consciousness Resp:  Negative for cough, wheezing, shortness of breath GI:  Negative for nausea, vomiting, diarrhea, bloody stools GU:  Positives noted in HPI; otherwise negative for gross hematuria, dysuria, urinary incontinence Neuro:  Negative for seizures, poor balance, limb weakness, slurred speech Psych:  Negative for lack of energy, depression, anxiety Endocrine:  Negative for polydipsia, polyuria, symptoms of hypoglycemia (dizziness, hunger, sweating) Hematologic:  Negative for anemia, purpura, petechia, prolonged or excessive  bleeding, use of anticoagulants  Allergic:  Negative for difficulty breathing or choking as a result of exposure to anything; no shellfish allergy; no allergic response (rash/itch) to materials, foods  Physical exam: There were no vitals taken for this visit. GENERAL APPEARANCE:  Well appearing, well developed, well nourished, NAD HEENT: Atraumatic, Normocephalic. NECK: Normal appearance LUNGS: Normal inspiratory and expiratory excursion HEART: Regular Rate ABDOMEN: No inguinal hernias GU: Phallus normal, no lesions. Scrotal skin normal. Testicles/epididymal structures normal. Meatus normal. Normal anal sphincter tone, prostate 30 mL, symmetric, non nodular, non tender. EXTREMITIES: Moves all extremities well.  Without clubbing, cyanosis, or edema. NEUROLOGIC:  Alert and oriented x 3, normal gait, CN II-XII grossly intact.  MENTAL STATUS:  Appropriate. SKIN:  Warm, dry and intact.    Results:  I have reviewed referring/prior physicians notes  I have reviewed IPSS  I have reviewed PSA results--0.67 in March 2024  I have reviewed most recent laboratories-BMP in August of this year, CBC in October 2024    Assessment: 1.  LUTS.  I think mainly overactive bladder  2.  ED, not currently interested in therapy.  He has failed  3.  Low testosterone , on repletion with AndroGel  at a [facility] clinic   Plan: 1.  I cautioned the patient that he does not need supraphysiologic levels of the testosterone   2.  Overactive bladder treatment discussed with patient-he would like to try medical therapy  3.  Behavioral modification discussed  4.  I will have him come back in 2 months for recheck

## 2023-12-01 ENCOUNTER — Ambulatory Visit: Admitting: Urology

## 2023-12-01 VITALS — BP 144/92 | HR 86 | Ht 70.0 in | Wt 168.0 lb

## 2023-12-01 DIAGNOSIS — R350 Frequency of micturition: Secondary | ICD-10-CM | POA: Diagnosis not present

## 2023-12-01 DIAGNOSIS — R351 Nocturia: Secondary | ICD-10-CM

## 2023-12-01 DIAGNOSIS — E291 Testicular hypofunction: Secondary | ICD-10-CM

## 2023-12-01 DIAGNOSIS — N5201 Erectile dysfunction due to arterial insufficiency: Secondary | ICD-10-CM

## 2023-12-01 DIAGNOSIS — N529 Male erectile dysfunction, unspecified: Secondary | ICD-10-CM | POA: Diagnosis not present

## 2023-12-01 DIAGNOSIS — R3915 Urgency of urination: Secondary | ICD-10-CM

## 2023-12-01 DIAGNOSIS — N401 Enlarged prostate with lower urinary tract symptoms: Secondary | ICD-10-CM

## 2023-12-01 LAB — BLADDER SCAN AMB NON-IMAGING: Scan Result: 54

## 2023-12-01 MED ORDER — SOLIFENACIN SUCCINATE 10 MG PO TABS: 10.0000 mg | ORAL_TABLET | Freq: Every day | ORAL | 11 refills | Status: AC

## 2023-12-16 ENCOUNTER — Encounter: Payer: Self-pay | Admitting: Family Medicine

## 2024-01-04 ENCOUNTER — Other Ambulatory Visit: Payer: Self-pay | Admitting: Family Medicine

## 2024-01-23 ENCOUNTER — Ambulatory Visit: Payer: Self-pay | Admitting: Family Medicine

## 2024-01-23 ENCOUNTER — Encounter: Payer: Self-pay | Admitting: Family Medicine

## 2024-01-23 ENCOUNTER — Ambulatory Visit (INDEPENDENT_AMBULATORY_CARE_PROVIDER_SITE_OTHER): Admitting: Family Medicine

## 2024-01-23 VITALS — BP 128/70 | HR 95 | Temp 97.7°F | Ht 70.0 in | Wt 170.3 lb

## 2024-01-23 DIAGNOSIS — Z7984 Long term (current) use of oral hypoglycemic drugs: Secondary | ICD-10-CM | POA: Diagnosis not present

## 2024-01-23 DIAGNOSIS — Z0000 Encounter for general adult medical examination without abnormal findings: Secondary | ICD-10-CM | POA: Diagnosis not present

## 2024-01-23 DIAGNOSIS — I10 Essential (primary) hypertension: Secondary | ICD-10-CM

## 2024-01-23 DIAGNOSIS — E782 Mixed hyperlipidemia: Secondary | ICD-10-CM

## 2024-01-23 DIAGNOSIS — E1122 Type 2 diabetes mellitus with diabetic chronic kidney disease: Secondary | ICD-10-CM

## 2024-01-23 DIAGNOSIS — Z23 Encounter for immunization: Secondary | ICD-10-CM

## 2024-01-23 DIAGNOSIS — N1831 Chronic kidney disease, stage 3a: Secondary | ICD-10-CM | POA: Diagnosis not present

## 2024-01-23 DIAGNOSIS — J301 Allergic rhinitis due to pollen: Secondary | ICD-10-CM

## 2024-01-23 DIAGNOSIS — F419 Anxiety disorder, unspecified: Secondary | ICD-10-CM

## 2024-01-23 DIAGNOSIS — I2510 Atherosclerotic heart disease of native coronary artery without angina pectoris: Secondary | ICD-10-CM | POA: Diagnosis not present

## 2024-01-23 DIAGNOSIS — J309 Allergic rhinitis, unspecified: Secondary | ICD-10-CM | POA: Insufficient documentation

## 2024-01-23 DIAGNOSIS — H9192 Unspecified hearing loss, left ear: Secondary | ICD-10-CM

## 2024-01-23 LAB — HEMOGLOBIN A1C: Hgb A1c MFr Bld: 6.8 % — ABNORMAL HIGH (ref 4.6–6.5)

## 2024-01-23 LAB — GLUCOSE, RANDOM: Glucose, Bld: 94 mg/dL (ref 70–99)

## 2024-01-23 MED ORDER — ALPRAZOLAM 0.5 MG PO TABS: ORAL_TABLET | ORAL | 1 refills | Status: AC

## 2024-01-23 NOTE — Assessment & Plan Note (Signed)
 Stable. No angina. Continue aspirin 81 mg daily and clopidogrel 75 mg daily.

## 2024-01-23 NOTE — Assessment & Plan Note (Signed)
 Blood pressure is in good control. Continue valsartan - HCTZ (Diovan -HCT) 160-12.5 mg daily and metoprolol  succinate 50 mg daily.

## 2024-01-23 NOTE — Assessment & Plan Note (Signed)
 Overall health is good. Recommend regular exercise. Discussed recommended screenings and immunizations.

## 2024-01-23 NOTE — Assessment & Plan Note (Signed)
 Recommend he consider using Flonase spray for management of seasonal rhinitis.

## 2024-01-23 NOTE — Progress Notes (Signed)
 [facility] [address] [place] [address] Dept: [phone] Dept Fax: [phone]  Annual Physical Visit  Subjective:    Patient ID: Carlos Montgomery, male    DOB: [birth year], 58 y.o..   MRN: [id number]  Chief Complaint  Patient presents with   Annual Exam    CPE/labs.   No concerns.     History of Present Illness:  Patient is in today for an annual physical/preventative visit.  Mr. Liford has a history of Type 2 diabetes. He had been managed on metformin  500 mg bid and dapagliflozin  (farxiga ) 5 mg daily.     Mr. Hazan has a history of hypertension and has been managed on metoprolol  succinate 50 mg daily and valsartan -HCTZ (Diovan -HCT) 160-12.5 mg daily.    Mr. Dansereau has a history of CAD and is s/p inferior STEMI in 2016. He had two RCA stents placed. He is currently on metoprolol  succinate 50 mg daily, aspirin  81 mg daily and clopidogrel  75 mg daily.   Mr. Jarrett has a history of hyperlipidemia and is managed on atorvastatin  40 mg daily, ezetimibe  10 mg daily, fenofibrate  160 mg daily, and fish oil. He follows with Dr. Wendel.   Review of Systems  Constitutional:  Negative for chills, diaphoresis, fever, malaise/fatigue and weight loss.  HENT:  Positive for congestion and hearing loss. Negative for ear pain, sinus pain, sore throat and tinnitus.        Notes poor hearing in left ear. Feels right is pretty normal.  Has allergic symptoms fairly frequently. He is not treating his congestion or morning cough with anything in particular at present.  Eyes:  Negative for blurred vision, pain, discharge and redness.  Respiratory:  Negative for cough, shortness of breath and wheezing.   Cardiovascular:  Negative for chest pain and palpitations.  Gastrointestinal:  Negative for abdominal pain, constipation, diarrhea, heartburn, nausea and vomiting.  Genitourinary: Negative.        Was found to have urinary retention by  urology. Currently using Vesicare  and doing much better.  Musculoskeletal:  Negative for back pain, joint pain and myalgias.  Skin:  Negative for itching and rash.  Psychiatric/Behavioral:  Negative for depression. The patient is not nervous/anxious.    Past Medical History: Patient Active Problem List   Diagnosis Date Noted   Chronic kidney disease, stage 3a (HCC) 04/16/2023   Ocular trauma of right eye 03/06/2020   History of inferior wall myocardial infarction 01/19/2015   Erectile dysfunction 01/19/2015   CAD (coronary artery disease), native coronary artery 01/19/2015   Type 2 diabetes mellitus without complication 12/16/2013   Hepatic steatosis 06/13/2010   Retinal vascular occlusion 05/23/2010   History of colon polyps 11/15/2007   Peyronie's disease 11/15/2007   Hyperlipidemia 11/11/2007   Anxiety 11/11/2007   Essential hypertension 11/11/2007   GERD 11/11/2007   Past Surgical History:  Procedure Laterality Date   broken wrists     CARDIAC CATHETERIZATION N/A 11/29/2014   Procedure: Left Heart Cath and Coronary Angiography;  Surgeon: Candyce GORMAN Reek, MD;  Location: [facility] INVASIVE CV LAB;  Service: Cardiovascular;  Laterality: N/A;   CARDIAC CATHETERIZATION N/A 11/29/2014   Procedure: Coronary Stent Intervention;  Surgeon: Candyce GORMAN Reek, MD;  Location: [facility] INVASIVE CV LAB;  Service: Cardiovascular;  Laterality: N/A;   RETINAL DETACHMENT SURGERY     RHINOPLASTY     TONSILLECTOMY AND ADENOIDECTOMY     Family History  Problem Relation Age of Onset   Heart disease  Father    Hyperlipidemia Father    Heart attack Maternal Grandfather 15   Heart attack Paternal Grandfather 15   Colon cancer Neg Hx    Pancreatic cancer Neg Hx    Stomach cancer Neg Hx    Hypertension Neg Hx    Stroke Neg Hx    Outpatient Medications Prior to Visit  Medication Sig Dispense Refill   ALPRAZolam  (XANAX ) 0.5 MG tablet TAKE 1/2 TO 1 TABLET BY MOUTH TWICE A DAY AS NEEDED FOR ANXIETY 45  tablet 1   Ascorbic Acid (VITAMIN C) 1000 MG tablet Take 1,000 mg by mouth daily.     aspirin  81 MG tablet Take 81 mg by mouth daily.      atorvastatin  (LIPITOR ) 40 MG tablet TAKE 1 TABLET BY MOUTH EVERY DAY 90 tablet 3   Cinnamon 500 MG TABS Take 3 tablets by mouth daily.     clopidogrel  (PLAVIX ) 75 MG tablet TAKE 1 TABLET BY MOUTH EVERY DAY 90 tablet 3   dapagliflozin  propanediol (FARXIGA ) 5 MG TABS tablet TAKE 1 TABLET (5 MG TOTAL) BY MOUTH DAILY. 90 tablet 2   ezetimibe  (ZETIA ) 10 MG tablet TAKE 1 TABLET BY MOUTH EVERY DAY 90 tablet 3   fenofibrate  160 MG tablet TAKE 1 TABLET BY MOUTH EVERY DAY 90 tablet 3   magnesium oxide (MAG-OX) 400 MG tablet Take 400 mg by mouth daily.     metFORMIN  (GLUCOPHAGE ) 500 MG tablet TAKE 1 TABLET BY MOUTH 2 TIMES DAILY WITH A MEAL. 180 tablet 3   metoprolol  succinate (TOPROL -XL) 50 MG 24 hr tablet Take 1 tablet (50 mg total) by mouth daily. Take with or immediately following a meal. 90 tablet 3   Multiple Vitamin (THERA) TABS Take 1 tablet by mouth daily.     nitroGLYCERIN  (NITROSTAT ) 0.4 MG SL tablet Place 1 tablet (0.4 mg total) under the tongue every 5 (five) minutes as needed for chest pain (X3 DOSES BEFORE CALLING 911). 25 tablet 6   Omega-3 Fatty Acids (FISH OIL) 1200 MG CAPS Take 2,400 mg by mouth daily.     pantoprazole  (PROTONIX ) 40 MG tablet TAKE 1 TABLET BY MOUTH EVERY DAY 90 tablet 3   sildenafil  (VIAGRA ) 50 MG tablet Take 1 tablet (50 mg total) by mouth daily as needed for erectile dysfunction. 10 tablet 11   Sodium Sulfate-Mag Sulfate-KCl (SUTAB ) 1479-225-188 MG TABS Use as directed for colonoscopy. MANUFACTURER CODES!! BIN: [id number] PCN: CN GROUP: [id number] MEMBER ID: [id number];MLW AS SECONDARY INSURANCE ;NO PRIOR AUTHORIZATION 24 tablet 0   solifenacin  (VESICARE ) 10 MG tablet Take 1 tablet (10 mg total) by mouth daily. 30 tablet 11   valsartan -hydrochlorothiazide  (DIOVAN -HCT) 160-12.5 MG tablet TAKE 1 TABLET BY MOUTH EVERY DAY 90 tablet 3   zinc  gluconate 50 MG tablet Take 50 mg by mouth daily.     No facility-administered medications prior to visit.   Allergies  Allergen Reactions   Peanut-Containing Drug Products Other (See Comments)    Pine nuts only.   Codeine Palpitations    Increases heart rate, rapid heart rate   Niacin Other (See Comments)    unknown   Penicillins Other (See Comments)    Does not know what reaction - happened as a child Was told not to take as a child.   Rosuvastatin Other (See Comments)    Night sweats   Objective:   Today's Vitals   01/23/24 1309  BP: 128/70  Pulse: 95  Temp: 97.7 F (36.5 C)  TempSrc: Temporal  SpO2: 99%  Weight: 170 lb 4.8 oz (77.2 kg)  Height: 5' 10 (1.778 m)   Body mass index is 24.44 kg/m.   General: Well developed, well nourished. No acute distress. HEENT: Normocephalic, non-traumatic. PERRL, EOMI. Conjunctiva clear. External ears normal. EAC and TMs normal   bilaterally. Nose clear without congestion or rhinorrhea. Mucous membranes moist. Oropharynx clear. Good dentition. Neck: Supple. No lymphadenopathy. No thyromegaly. Lungs: Clear to auscultation bilaterally. No wheezing, rales or rhonchi. CV: RRR without murmurs or rubs. Pulses 2+ bilaterally. Abdomen: Soft, non-tender. Bowel sounds positive, normal pitch and frequency. No hepatosplenomegaly. No rebound   or guarding. Extremities: Full ROM. No joint swelling or tenderness. No edema noted. Feet- Skin intact. No sign of maceration between toes. Nails are normal. Dorsalis pedis and posterior tibial artery   pulses are normal. 5.07 monofilament testing normal. Skin: Warm and dry. No rashes. Psych: Alert and oriented. Normal mood and affect.  Health Maintenance Due  Topic Date Due   Pneumococcal Vaccine: 50+ Years (1 of 2 - PCV) Never done   Hepatitis B Vaccines 19-59 Average Risk (1 of 3 - 19+ 3-dose series) Never done   OPHTHALMOLOGY EXAM  06/02/2022   Zoster Vaccines- Shingrix  (2 of 2) 01/16/2023    Influenza Vaccine  Never done   COVID-19 Vaccine (5 - 2025-26 season) 11/17/2023     Assessment & Plan:   Problem List Items Addressed This Visit       Cardiovascular and Mediastinum   CAD (coronary artery disease), native coronary artery   Stable. No angina. Continue aspirin  81 mg daily and clopidogrel  75 mg daily.      Essential hypertension   Blood pressure is in good control. Continue valsartan - HCTZ (Diovan -HCT) 160-12.5 mg daily and metoprolol  succinate 50 mg daily.        Respiratory   Allergic rhinitis   Recommend he consider using Flonase spray for management of seasonal rhinitis.        Endocrine   Type 2 diabetes mellitus with stage 3a chronic kidney disease (HCC)   Continue metformin  500 mg twice daily and dapagliflozin  (Farxiga ) 5 mg daily. I will check an A1c today.      Relevant Orders   Glucose, random (Completed)   Hemoglobin A1c (Completed)     Genitourinary   Chronic kidney disease, stage 3a (HCC)   Continue focus on blood pressure and glucose control, adequate hydration, and avoidance of nephrotoxic medications.        Other   Annual physical exam - Primary   Overall health is good. Recommend regular exercise. Discussed recommended screenings and immunizations.       Anxiety   I will renew alprazolam .      Relevant Medications   ALPRAZolam  (XANAX ) 0.5 MG tablet   Hyperlipidemia   Continue atorvastatin  40 mg daily, ezetimibe  10 mg daily, fenofibrate  160 mg daily, and fish oil.      Other Visit Diagnoses       Hearing loss of left ear, unspecified hearing loss type       I will refer to audiology for a hearing test.   Relevant Orders   Ambulatory referral to Audiology     Need for pneumococcal 20-valent conjugate vaccination       Relevant Orders   Pneumococcal conjugate vaccine 20-valent (Completed)       Return in about 3 months (around 04/24/2024) for Reassessment.   Carlos CHRISTELLA Simpler, MD  I,Emily Lagle,acting as a scribe for  Carlos CHRISTELLA Simpler,  MD.,have documented all relevant documentation on the behalf of Carlos CHRISTELLA Simpler, MD.  I, Carlos CHRISTELLA Simpler, MD, have reviewed all documentation for this visit. The documentation on 01/23/2024 for the exam, diagnosis, procedures, and orders are all accurate and complete.

## 2024-01-23 NOTE — Assessment & Plan Note (Addendum)
 Continue metformin  500 mg twice daily and dapagliflozin  (Farxiga ) 5 mg daily. I will check an A1c today.

## 2024-01-23 NOTE — Assessment & Plan Note (Addendum)
 Continue focus on blood pressure and glucose control, adequate hydration, and avoidance of nephrotoxic medications.

## 2024-01-23 NOTE — Assessment & Plan Note (Signed)
 I will renew alprazolam .

## 2024-01-23 NOTE — Assessment & Plan Note (Signed)
 Continue sildenafil . Follow-up with new urologist.

## 2024-01-23 NOTE — Assessment & Plan Note (Signed)
 Continue atorvastatin  40 mg daily, ezetimibe  10 mg daily, fenofibrate  160 mg daily, and fish oil.

## 2024-01-26 ENCOUNTER — Encounter: Payer: Self-pay | Admitting: Family Medicine

## 2024-01-27 DIAGNOSIS — E291 Testicular hypofunction: Secondary | ICD-10-CM | POA: Diagnosis not present

## 2024-01-27 DIAGNOSIS — N400 Enlarged prostate without lower urinary tract symptoms: Secondary | ICD-10-CM | POA: Diagnosis not present

## 2024-01-29 DIAGNOSIS — E291 Testicular hypofunction: Secondary | ICD-10-CM | POA: Diagnosis not present

## 2024-02-09 NOTE — Progress Notes (Deleted)
 Chief Complaint: ED, urinary problems  History of Present Illness:  9.15.2025: Initial visit for evaluation of urinary frequency, urgency and nocturia.  Rare urgency incontinence.  Saw a younger physician/urologist in [facility] a year or 2 ago for similar concerns.  He feels like the urologist main concern was his ED, which is a problem but not a significant concern at this time.  Patient has failed Viagra .  His wife is not that interested in intercourse at the present time.  His biggest complaint is nocturia.  IPSS 19/5.  He has not been on any medical therapy.  He does drink 1 or 2 alcoholic beverages in the evening.  He does not drink caffeinated beverages.  The patient is seeing a provider at [facility] and has been started on testosterone  repletion.  He states that his testosterone  level in the morning a while back, before 10:00 was 267.   Past Medical History:  Past Medical History:  Diagnosis Date   CAD (coronary artery disease)    cath 11/29/2014 bifurcation Culotte stent to distal RCA and ost PDA treated with 2.75x28 mm Synergy stent, and ost PLA with 2.5x58mm DES, 90% small OM2   Diabetes mellitus    metformin    Hyperlipemia    Hypertension    Pulmonary nodule 09/06/2010   Tobacco abuse 11/29/2014    Past Surgical History:  Past Surgical History:  Procedure Laterality Date   broken wrists     CARDIAC CATHETERIZATION N/A 11/29/2014   Procedure: Left Heart Cath and Coronary Angiography;  Surgeon: Candyce GORMAN Reek, MD;  Location: [facility] INVASIVE CV LAB;  Service: Cardiovascular;  Laterality: N/A;   CARDIAC CATHETERIZATION N/A 11/29/2014   Procedure: Coronary Stent Intervention;  Surgeon: Candyce GORMAN Reek, MD;  Location: [facility] INVASIVE CV LAB;  Service: Cardiovascular;  Laterality: N/A;   RETINAL DETACHMENT SURGERY     RHINOPLASTY     TONSILLECTOMY AND ADENOIDECTOMY      Allergies:  Allergies  Allergen Reactions   Peanut-Containing Drug Products Other (See Comments)     Pine nuts only.   Codeine Palpitations    Increases heart rate, rapid heart rate   Niacin Other (See Comments)    unknown   Penicillins Other (See Comments)    Does not know what reaction - happened as a child Was told not to take as a child.   Rosuvastatin Other (See Comments)    Night sweats    Family History:  Family History  Problem Relation Age of Onset   Heart disease Father    Hyperlipidemia Father    Heart attack Maternal Grandfather 75   Heart attack Paternal Grandfather 22   Colon cancer Neg Hx    Pancreatic cancer Neg Hx    Stomach cancer Neg Hx    Hypertension Neg Hx    Stroke Neg Hx     Social History:  Social History   Tobacco Use   Smoking status: Former    Current packs/day: 0.00    Types: Cigarettes    Quit date: 03/18/1998    Years since quitting: 25.9   Smokeless tobacco: Never   Tobacco comments:    smoked <1ppd for 15 yrs & quit ~2000...  Vaping Use   Vaping status: Never Used  Substance Use Topics   Alcohol use: No    Alcohol/week: 0.0 standard drinks of alcohol   Drug use: No    Review of symptoms:  Constitutional:  Negative for unexplained weight loss, night sweats, fever, chills ENT:  Negative for nose bleeds, sinus pain, painful swallowing CV:  Negative for chest pain, shortness of breath, exercise intolerance, palpitations, loss of consciousness Resp:  Negative for cough, wheezing, shortness of breath GI:  Negative for nausea, vomiting, diarrhea, bloody stools GU:  Positives noted in HPI; otherwise negative for gross hematuria, dysuria, urinary incontinence Neuro:  Negative for seizures, poor balance, limb weakness, slurred speech Psych:  Negative for lack of energy, depression, anxiety Endocrine:  Negative for polydipsia, polyuria, symptoms of hypoglycemia (dizziness, hunger, sweating) Hematologic:  Negative for anemia, purpura, petechia, prolonged or excessive bleeding, use of anticoagulants  Allergic:  Negative for difficulty  breathing or choking as a result of exposure to anything; no shellfish allergy; no allergic response (rash/itch) to materials, foods  Physical exam: There were no vitals taken for this visit. GENERAL APPEARANCE:  Well appearing, well developed, well nourished, NAD HEENT: Atraumatic, Normocephalic. NECK: Normal appearance LUNGS: Normal inspiratory and expiratory excursion HEART: Regular Rate ABDOMEN: No inguinal hernias GU: Phallus normal, no lesions. Scrotal skin normal. Testicles/epididymal structures normal. Meatus normal. Normal anal sphincter tone, prostate 30 mL, symmetric, non nodular, non tender. EXTREMITIES: Moves all extremities well.  Without clubbing, cyanosis, or edema. NEUROLOGIC:  Alert and oriented x 3, normal gait, CN II-XII grossly intact.  MENTAL STATUS:  Appropriate. SKIN:  Warm, dry and intact.    Results:  I have reviewed referring/prior physicians notes  I have reviewed IPSS  I have reviewed PSA results--0.67 in March 2024  I have reviewed most recent laboratories-BMP in August of this year, CBC in October 2024    Assessment: 1.  LUTS.  I think mainly overactive bladder  2.  ED, not currently interested in therapy.  He has failed  3.  Low testosterone , on repletion with AndroGel  at a men[facility] clinic   Plan: 1.  I cautioned the patient that he does not need supraphysiologic levels of the testosterone   2.  Overactive bladder treatment discussed with patient-he would like to try medical therapy  3.  Behavioral modification discussed  4.  I will have him come back in 2 months for recheck

## 2024-02-11 ENCOUNTER — Ambulatory Visit: Admitting: Urology

## 2024-02-24 NOTE — Progress Notes (Unsigned)
 Assessment: 1.  LUTS.  I think mainly overactive bladder  2.  ED, not currently interested in therapy.  He has failed  3.  Low testosterone , on repletion with AndroGel  at a men's clinic   Plan: 1.  I cautioned the patient that he does not need supraphysiologic levels of the testosterone   2.  Overactive bladder treatment discussed with patient-he would like to try medical therapy  3.  Behavioral modification discussed  4.  I will have him come back in 2 months for recheck  History of Present Illness:  9.15.2025: Initial visit for evaluation of urinary frequency, urgency and nocturia.  Rare urgency incontinence.  Saw a younger physician/urologist in [facility] a year or 2 ago for similar concerns.  He feels like the urologist main concern was his ED, which is a problem but not a significant concern at this time.  Patient has failed Viagra .  His wife is not that interested in intercourse at the present time.  His biggest complaint is nocturia.  IPSS 19/5.  He has not been on any medical therapy.  He does drink 1 or 2 alcoholic beverages in the evening.  He does not drink caffeinated beverages.  The patient is seeing a provider at [facility] and has been started on testosterone  repletion.  He states that his testosterone  level in the morning a while back, before 10:00 was 267.  12.10.2025:   Past Medical History:  Past Medical History:  Diagnosis Date   CAD (coronary artery disease)    cath 11/29/2014 bifurcation Culotte stent to distal RCA and ost PDA treated with 2.75x28 mm Synergy stent, and ost PLA with 2.5x60mm DES, 90% small OM2   Diabetes mellitus    metformin    Hyperlipemia    Hypertension    Pulmonary nodule 09/06/2010   Tobacco abuse 11/29/2014    Past Surgical History:  Past Surgical History:  Procedure Laterality Date   broken wrists     CARDIAC CATHETERIZATION N/A 11/29/2014   Procedure: Left Heart Cath and Coronary Angiography;  Surgeon: Candyce GORMAN Reek, MD;   Location: [facility] INVASIVE CV LAB;  Service: Cardiovascular;  Laterality: N/A;   CARDIAC CATHETERIZATION N/A 11/29/2014   Procedure: Coronary Stent Intervention;  Surgeon: Candyce GORMAN Reek, MD;  Location: [facility] INVASIVE CV LAB;  Service: Cardiovascular;  Laterality: N/A;   RETINAL DETACHMENT SURGERY     RHINOPLASTY     TONSILLECTOMY AND ADENOIDECTOMY      Allergies:  Allergies  Allergen Reactions   Peanut-Containing Drug Products Other (See Comments)    Pine nuts only.   Codeine Palpitations    Increases heart rate, rapid heart rate   Niacin Other (See Comments)    unknown   Penicillins Other (See Comments)    Does not know what reaction - happened as a child Was told not to take as a child.   Rosuvastatin Other (See Comments)    Night sweats    Family History:  Family History  Problem Relation Age of Onset   Heart disease Father    Hyperlipidemia Father    Heart attack Maternal Grandfather 7   Heart attack Paternal Grandfather 55   Colon cancer Neg Hx    Pancreatic cancer Neg Hx    Stomach cancer Neg Hx    Hypertension Neg Hx    Stroke Neg Hx     Social History:  Social History   Tobacco Use   Smoking status: Former    Current packs/day: 0.00  Types: Cigarettes    Quit date: 03/18/1998    Years since quitting: 25.9   Smokeless tobacco: Never   Tobacco comments:    smoked <1ppd for 15 yrs & quit ~2000...  Vaping Use   Vaping status: Never Used  Substance Use Topics   Alcohol use: No    Alcohol/week: 0.0 standard drinks of alcohol   Drug use: No    Review of symptoms:  Constitutional:  Negative for unexplained weight loss, night sweats, fever, chills ENT:  Negative for nose bleeds, sinus pain, painful swallowing CV:  Negative for chest pain, shortness of breath, exercise intolerance, palpitations, loss of consciousness Resp:  Negative for cough, wheezing, shortness of breath GI:  Negative for nausea, vomiting, diarrhea, bloody stools GU:  Positives noted in  HPI; otherwise negative for gross hematuria, dysuria, urinary incontinence Neuro:  Negative for seizures, poor balance, limb weakness, slurred speech Psych:  Negative for lack of energy, depression, anxiety Endocrine:  Negative for polydipsia, polyuria, symptoms of hypoglycemia (dizziness, hunger, sweating) Hematologic:  Negative for anemia, purpura, petechia, prolonged or excessive bleeding, use of anticoagulants  Allergic:  Negative for difficulty breathing or choking as a result of exposure to anything; no shellfish allergy; no allergic response (rash/itch) to materials, foods  Physical exam: There were no vitals taken for this visit. GENERAL APPEARANCE:  Well appearing, well developed, well nourished, NAD HEENT: Atraumatic, Normocephalic. NECK: Normal appearance LUNGS: Normal inspiratory and expiratory excursion HEART: Regular Rate ABDOMEN: No inguinal hernias GU: Phallus normal, no lesions. Scrotal skin normal. Testicles/epididymal structures normal. Meatus normal. Normal anal sphincter tone, prostate 30 mL, symmetric, non nodular, non tender. EXTREMITIES: Moves all extremities well.  Without clubbing, cyanosis, or edema. NEUROLOGIC:  Alert and oriented x 3, normal gait, CN II-XII grossly intact.  MENTAL STATUS:  Appropriate. SKIN:  Warm, dry and intact.    Results:  I have reviewed referring/prior physicians notes  I have reviewed IPSS  I have reviewed PSA results--0.67 in March 2024  I have reviewed most recent laboratories-BMP in August of this year, CBC in October 2024

## 2024-02-24 NOTE — Progress Notes (Deleted)
 Cardiology Office Note:   Date:  02/24/2024  ID:  Carlos Montgomery, DOB 08-20-1965, MRN [id number] PCP:  [name], MD  [facility] Providers Cardiologist:  Wendel Haws, MD Referring MD: [name], MD  Chief Complaint/Reason for Referral:  CAD follow up ASSESSMENT:    No diagnosis found.   PLAN:   In order of problems listed above: History of acute coronary syndrome: Given complex bifurcation stenting will continue indefinite DAPT with aspirin  81 mg and Plavix  75 mg.  Continue atorvastatin  40 mg, as needed nitroglycerin  T2DM: Continue aspirin  81 mg, atorvastatin  40 mg, Farxiga  5 mg, and valsartan /hydrochlorothiazide  160 x 12.5 mg daily. Hypertension: Continue valsartan /hydrochlorothiazide  160 x 12.5 mg daily.*** Hyperlipidemia:   LDL 41 in August of this year.  Continue atorvastatin  40 mg.  Check hs-CRP for residual inflammatory burden*** CKD stage II: Continue valsartan /hydrochlorothiazide  160 x 12.5 mg daily and Farxiga  5 mg daily. Elevated BMI: Will refer to pharmacy for recommendations regarding GLP-1 receptor agonist therapy.*** Daytime somnolence: Will obtain sleep study.  If he does have sleep apnea which I highly suspect and he is started on GLP-1 receptor agonist is quite possible that the apnea may resolve.***            Dispo:  No follow-ups on file.      Medication Adjustments/Labs and Tests Ordered: Current medicines are reviewed at length with the patient today.  Concerns regarding medicines are outlined above.  The following changes have been made:  no change   Labs/tests ordered: No orders of the defined types were placed in this encounter.   Medication Changes: No orders of the defined types were placed in this encounter.   Current medicines are reviewed at length with the patient today.  The patient does not have concerns regarding medicines.  I spent *** minutes reviewing all clinical data during and prior to this visit including all  relevant imaging studies, laboratories, clinical information from other health systems and prior notes from both Cardiology and other specialties, interviewing the patient, conducting a complete physical examination, and coordinating care in order to formulate a comprehensive and personalized evaluation and treatment plan.   History of Present Illness:      FOCUSED PROBLEM LIST:   Inferior STEMI 2016 Status post culotte bifurcation PCI distal RCA and RPLV On extended duration DAPT High-grade OM1 disease >> med Rx T2DM Not on insulin Hyperlipidemia LDL goal < 55 (ACS) LP(a) less than 8.4 Hypertension CKD stage IIIa BMI 10 Aug 2023: The patient returns for routine follow-up.  He was seen last year and was doing well.  His blood pressure was well-controlled.    He has been experiencing significant fatigue and excessive daytime sleepiness for about a year, particularly in the afternoons. He feels extremely drowsy after lunch, impacting his ability to perform daily activities, including assisting his wife with household tasks. He also experiences tiredness while driving home, which he finds concerning.  He has a history of loud snoring at night, as noted by his wife, and feels particularly tired in the late afternoon. He wakes up early in the morning feeling fine but experiences a significant drop in energy post-lunch. No chest pain, shortness of breath, or palpitations.  He reports frequent urination at night, approximately three times, and has been consulting a urologist for this issue. He takes all his medications in the morning, including Farxiga , which can increase urination. He has tried a medication for urinary symptoms in the past but experienced severe  dizziness, leading him to discontinue it.  He has a history of smoking but has since quit. 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  Plan: Check lipid panel, LFTs, LP(a), refer to pharmacy  for GLP-1, obtain sleep study.  Obtain echocardiogram.  December 2025:  Patient consents to use of AI scribe. In the interim his LP(a) was not elevated and his LDL was 44.  Does not look like he met with pharmacy or had a sleep study.  His echocardiogram showed that his heart function was normal and his Toprol  was stopped.         Current Medications: No outpatient medications have been marked as taking for the 03/03/24 encounter (Appointment) with Kamerin Axford K, MD.     Review of Systems:   Please see the history of present illness.    All other systems reviewed and are negative.     EKGs/Labs/Other Test Reviewed:   EKG: 2025 normal sinus rhythm  EKG Interpretation Date/Time:    Ventricular Rate:    PR Interval:    QRS Duration:    QT Interval:    QTC Calculation:   R Axis:      Text Interpretation:           Risk Assessment/Calculations:          Physical Exam:   VS:  There were no vitals taken for this visit.   No BP recorded.  {Refresh Note OR Click here to enter BP  :1}***   Wt Readings from Last 3 Encounters:  01/23/24 170 lb 4.8 oz (77.2 kg)  12/01/23 168 lb (76.2 kg)  10/23/23 168 lb 6.4 oz (76.4 kg)      GENERAL:  No apparent distress, AOx3 HEENT:  No carotid bruits, +2 carotid impulses, no scleral icterus CAR: RRR no murmurs, gallops, rubs, or thrills RES:  Clear to auscultation bilaterally ABD:  Soft, nontender, nondistended, positive bowel sounds x 4 VASC:  +2 radial pulses, +2 carotid pulses NEURO:  CN 2-12 grossly intact; motor and sensory grossly intact PSYCH:  No active depression or anxiety EXT:  No edema, ecchymosis, or cyanosis  Signed, Sevilla Murtagh K Arnav Cregg, MD  02/24/2024 7:48 AM    [facility] [address] [place], [place], [place]  [address] Phone: [phone]; Fax: [phone]   Note:  This document was prepared using Dragon voice recognition software and may include unintentional dictation errors.

## 2024-02-25 ENCOUNTER — Encounter: Payer: Self-pay | Admitting: Urology

## 2024-02-25 ENCOUNTER — Ambulatory Visit: Admitting: Urology

## 2024-02-25 VITALS — BP 136/87 | HR 77 | Ht 70.0 in | Wt 165.0 lb

## 2024-02-25 DIAGNOSIS — N401 Enlarged prostate with lower urinary tract symptoms: Secondary | ICD-10-CM

## 2024-02-25 DIAGNOSIS — R351 Nocturia: Secondary | ICD-10-CM

## 2024-02-25 DIAGNOSIS — R3915 Urgency of urination: Secondary | ICD-10-CM

## 2024-02-25 DIAGNOSIS — N5201 Erectile dysfunction due to arterial insufficiency: Secondary | ICD-10-CM

## 2024-02-25 DIAGNOSIS — R3989 Other symptoms and signs involving the genitourinary system: Secondary | ICD-10-CM | POA: Diagnosis not present

## 2024-02-25 DIAGNOSIS — N529 Male erectile dysfunction, unspecified: Secondary | ICD-10-CM | POA: Diagnosis not present

## 2024-02-25 DIAGNOSIS — E291 Testicular hypofunction: Secondary | ICD-10-CM

## 2024-02-25 MED ORDER — SILDENAFIL CITRATE 100 MG PO TABS: ORAL_TABLET | ORAL | 99 refills | Status: AC

## 2024-02-25 NOTE — Addendum Note (Signed)
 Addended by: OBADIAH ROSELEE RAMAN on: 02/25/2024 03:51 PM   Modules accepted: Orders

## 2024-03-02 NOTE — Progress Notes (Unsigned)
 Cardiology Office Note:   Date:  03/03/2024  ID:  Carlos Montgomery, DOB 1966/03/14, MRN [id number] PCP:  [name], MD  [facility] Providers Cardiologist:  Wendel Haws, MD Referring MD: [name], MD  Chief Complaint/Reason for Referral:  CAD follow up ASSESSMENT:    1. Acute ST elevation myocardial infarction (STEMI) involving right coronary artery (HCC)   2. Type 2 diabetes mellitus with complication, without long-term current use of insulin (HCC)   3. Hypertension associated with diabetes (HCC)   4. Hyperlipidemia associated with type 2 diabetes mellitus (HCC)   5. CKD stage 2 due to type 2 diabetes mellitus (HCC)   6. BMI 25.0-25.9,adult   7. Daytime somnolence      PLAN:   In order of problems listed above: History of acute coronary syndrome: Given complex bifurcation stenting will continue indefinite DAPT with aspirin  81 mg and Plavix  75 mg.  Continue atorvastatin  40 mg, as needed nitroglycerin .  EF normal but will continue Toprol  50 mg daily given high-grade obtuse marginal disease T2DM: Continue aspirin  81 mg, atorvastatin  40 mg, Farxiga  5 mg, and valsartan /hydrochlorothiazide  160 x 12.5 mg daily. Hypertension: Continue valsartan /hydrochlorothiazide  160 x 12.5 mg daily, Toprol  50 mg.  BP is well-controlled today. Hyperlipidemia:   LDL 41 in August of this year.  Continue atorvastatin  40 mg.  Check hs-CRP for residual inflammatory burden; consider low-dose colchicine if elevated CKD stage II: Continue valsartan /hydrochlorothiazide  160 x 12.5 mg daily and Farxiga  5 mg daily. Elevated BMI: Hemoglobin A1c 6.8 recently and patient is on metformin  and Farxiga .  Will refer to pharmacy for recommendations regarding GLP-1 receptor agonist therapy. Daytime somnolence: Will obtain sleep study.            Dispo:  Return in about 6 months (around 09/01/2024).      Medication Adjustments/Labs and Tests Ordered: Current medicines are reviewed at length with the  patient today.  Concerns regarding medicines are outlined above.  The following changes have been made:  no change   Labs/tests ordered: Orders Placed This Encounter  Procedures   High sensitivity CRP   AMB Referral to [facility] Pharm-D   [place] Sleep Study    Medication Changes: No orders of the defined types were placed in this encounter.   Current medicines are reviewed at length with the patient today.  The patient does not have concerns regarding medicines.  I spent 39 minutes reviewing all clinical data during and prior to this visit including all relevant imaging studies, laboratories, clinical information from other health systems and prior notes from both Cardiology and other specialties, interviewing the patient, conducting a complete physical examination, and coordinating care in order to formulate a comprehensive and personalized evaluation and treatment plan.   History of Present Illness:      FOCUSED PROBLEM LIST:   Inferior STEMI 2016 Status post culotte bifurcation PCI distal RCA and RPLV On extended duration DAPT High-grade OM1 disease >> med Rx No significant valve issues, EF 55 to 60% TTE June 2025 T2DM Not on insulin Hyperlipidemia LDL goal < 55 (ACS) LP(a) less than 8.4 Hypertension CKD stage IIIa BMI 10 Aug 2023: The patient returns for routine follow-up.  He was seen last year and was doing well.  His blood pressure was well-controlled.    He has been experiencing significant fatigue and excessive daytime sleepiness for about a year, particularly in the afternoons. He feels extremely drowsy after lunch, impacting his ability to perform daily activities, including assisting his  wife with household tasks. He also experiences tiredness while driving home, which he finds concerning.  He has a history of loud snoring at night, as noted by his wife, and feels particularly tired in the late afternoon. He wakes up early in the morning feeling fine but  experiences a significant drop in energy post-lunch. No chest pain, shortness of breath, or palpitations.  He reports frequent urination at night, approximately three times, and has been consulting a urologist for this issue. He takes all his medications in the morning, including Farxiga , which can increase urination. He has tried a medication for urinary symptoms in the past but experienced severe dizziness, leading him to discontinue it.  He has a history of smoking but has since quit. 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  Plan: Check lipid panel, LFTs, LP(a), refer to pharmacy for GLP-1, obtain sleep study.  Obtain echocardiogram.  December 2025:  Patient consents to use of AI scribe. In the interim his LP(a) was not elevated and his LDL was 44.  Does not look like he met with pharmacy or had a sleep study.  His echocardiogram showed that his heart function was normal.  He woke up yesterday morning with significant facial swelling, particularly around his lips, described by his wife as resembling 'Mick Jagger.' He also experienced irritation and bleeding of the gums, and a sore throat that made swallowing difficult. He has not had any recent changes in diet or medication that could have triggered this reaction. He took Benadryl, which alleviated some swelling but caused drowsiness. No shortness of breath, chest pain, or leg swelling.          Current Medications: Current Meds  Medication Sig   ALPRAZolam  (XANAX ) 0.5 MG tablet TAKE 1/2 TO 1 TABLET BY MOUTH TWICE A DAY AS NEEDED FOR ANXIETY   Ascorbic Acid (VITAMIN C) 1000 MG tablet Take 1,000 mg by mouth daily.   aspirin  81 MG tablet Take 81 mg by mouth daily.    atorvastatin  (LIPITOR ) 40 MG tablet TAKE 1 TABLET BY MOUTH EVERY DAY   Cinnamon 500 MG TABS Take 3 tablets by mouth daily.   clopidogrel  (PLAVIX ) 75 MG tablet TAKE 1 TABLET BY MOUTH EVERY DAY   dapagliflozin  propanediol (FARXIGA ) 5  MG TABS tablet TAKE 1 TABLET (5 MG TOTAL) BY MOUTH DAILY.   ezetimibe  (ZETIA ) 10 MG tablet TAKE 1 TABLET BY MOUTH EVERY DAY   fenofibrate  160 MG tablet TAKE 1 TABLET BY MOUTH EVERY DAY   magnesium oxide (MAG-OX) 400 MG tablet Take 400 mg by mouth daily.   metFORMIN  (GLUCOPHAGE ) 500 MG tablet TAKE 1 TABLET BY MOUTH 2 TIMES DAILY WITH A MEAL.   metoprolol  succinate (TOPROL -XL) 50 MG 24 hr tablet Take 1 tablet (50 mg total) by mouth daily. Take with or immediately following a meal.   Multiple Vitamin (THERA) TABS Take 1 tablet by mouth daily.   nitroGLYCERIN  (NITROSTAT ) 0.4 MG SL tablet Place 1 tablet (0.4 mg total) under the tongue every 5 (five) minutes as needed for chest pain (X3 DOSES BEFORE CALLING 911).   Omega-3 Fatty Acids (FISH OIL) 1200 MG CAPS Take 2,400 mg by mouth daily.   pantoprazole  (PROTONIX ) 40 MG tablet TAKE 1 TABLET BY MOUTH EVERY DAY   sildenafil  (VIAGRA ) 100 MG tablet Take 1/2 to 1 tablet p.o. as needed   Sodium Sulfate-Mag Sulfate-KCl (SUTAB ) 1479-225-188 MG TABS Use as directed for colonoscopy. MANUFACTURER CODES!! EBONY: [id number] PCN: [id number]  GROUP: [id number] MEMBER ID: [id number];MLW AS SECONDARY INSURANCE ;NO PRIOR AUTHORIZATION   solifenacin  (VESICARE ) 10 MG tablet Take 1 tablet (10 mg total) by mouth daily.   valsartan -hydrochlorothiazide  (DIOVAN -HCT) 160-12.5 MG tablet TAKE 1 TABLET BY MOUTH EVERY DAY   zinc gluconate 50 MG tablet Take 50 mg by mouth daily.     Review of Systems:   Please see the history of present illness.    All other systems reviewed and are negative.     EKGs/Labs/Other Test Reviewed:   EKG: 2025 normal sinus rhythm  EKG Interpretation Date/Time:    Ventricular Rate:    PR Interval:    QRS Duration:    QT Interval:    QTC Calculation:   R Axis:      Text Interpretation:           Risk Assessment/Calculations:     STOP-Bang Score:  5       Physical Exam:   VS:  BP 122/74   Pulse (!) 101   Ht 5' 10 (1.778 m)   Wt 172 lb 4.8 oz  (78.2 kg)   SpO2 96%   BMI 24.72 kg/m        Wt Readings from Last 3 Encounters:  03/03/24 172 lb 4.8 oz (78.2 kg)  02/25/24 165 lb (74.8 kg)  01/23/24 170 lb 4.8 oz (77.2 kg)      GENERAL:  No apparent distress, AOx3 HEENT:  No carotid bruits, +2 carotid impulses, no scleral icterus CAR: RRR no murmurs, gallops, rubs, or thrills RES:  Clear to auscultation bilaterally ABD:  Soft, nontender, nondistended, positive bowel sounds x 4 VASC:  +2 radial pulses, +2 carotid pulses NEURO:  CN 2-12 grossly intact; motor and sensory grossly intact PSYCH:  No active depression or anxiety EXT:  No edema, ecchymosis, or cyanosis  Signed, Dorsie Sethi K Afiya Ferrebee, MD  03/03/2024 4:51 PM    [facility] [address] [place], [place], [place]  [address] Phone: [phone]; Fax: [phone]   Note:  This document was prepared using Dragon voice recognition software and may include unintentional dictation errors.

## 2024-03-03 ENCOUNTER — Ambulatory Visit: Admitting: Internal Medicine

## 2024-03-03 ENCOUNTER — Ambulatory Visit: Attending: Internal Medicine | Admitting: Internal Medicine

## 2024-03-03 ENCOUNTER — Encounter: Payer: Self-pay | Admitting: Internal Medicine

## 2024-03-03 VITALS — BP 122/74 | HR 101 | Ht 70.0 in | Wt 172.3 lb

## 2024-03-03 DIAGNOSIS — I152 Hypertension secondary to endocrine disorders: Secondary | ICD-10-CM

## 2024-03-03 DIAGNOSIS — Z6825 Body mass index (BMI) 25.0-25.9, adult: Secondary | ICD-10-CM

## 2024-03-03 DIAGNOSIS — E1122 Type 2 diabetes mellitus with diabetic chronic kidney disease: Secondary | ICD-10-CM | POA: Diagnosis not present

## 2024-03-03 DIAGNOSIS — I2111 ST elevation (STEMI) myocardial infarction involving right coronary artery: Secondary | ICD-10-CM | POA: Diagnosis not present

## 2024-03-03 DIAGNOSIS — E1169 Type 2 diabetes mellitus with other specified complication: Secondary | ICD-10-CM | POA: Diagnosis not present

## 2024-03-03 DIAGNOSIS — E118 Type 2 diabetes mellitus with unspecified complications: Secondary | ICD-10-CM

## 2024-03-03 DIAGNOSIS — E785 Hyperlipidemia, unspecified: Secondary | ICD-10-CM

## 2024-03-03 DIAGNOSIS — E1159 Type 2 diabetes mellitus with other circulatory complications: Secondary | ICD-10-CM | POA: Diagnosis not present

## 2024-03-03 DIAGNOSIS — R400 Somnolence: Secondary | ICD-10-CM

## 2024-03-03 DIAGNOSIS — N182 Chronic kidney disease, stage 2 (mild): Secondary | ICD-10-CM | POA: Diagnosis not present

## 2024-03-03 NOTE — Patient Instructions (Addendum)
 Medication Instructions:  No medication changes were made at this visit. Continue current regimen.   *If you need a refill on your cardiac medications before your next appointment, please call your pharmacy*  Lab Work: To be completed today: hs-CRP  If you have labs (blood work) drawn today and your tests are completely normal, you will receive your results only by: MyChart Message (if you have MyChart) OR A paper copy in the mail If you have any lab test that is abnormal or we need to change your treatment, we will call you to review the results.  Testing/Procedures: Your physician has recommended that you have a sleep study. This test records several body functions during sleep, including: brain activity, eye movement, oxygen and carbon dioxide blood levels, heart rate and rhythm, breathing rate and rhythm, the flow of air through your mouth and nose, snoring, body muscle movements, and chest and belly movement.  Follow-Up: At [facility], you and your health needs are our priority.  As part of our continuing mission to provide you with exceptional heart care, our providers are all part of one team.  This team includes your primary Cardiologist (physician) and Advanced Practice Providers or APPs (Physician Assistants and Nurse Practitioners) who all work together to provide you with the care you need, when you need it.  Your next appointment:   6 month(s)  Provider:   Glendia Ferrier, PA-C    We recommend signing up for the patient portal called MyChart.  Sign up information is provided on this After Visit Summary.  MyChart is used to connect with patients for Virtual Visits (Telemedicine).  Patients are able to view lab/test results, encounter notes, upcoming appointments, etc.  Non-urgent messages can be sent to your provider as well.   To learn more about what you can do with MyChart, go to [email or web address].   Other Instructions You have been referred to our PharmD  clinic to meet with our pharmacy team. They will be reaching out to schedule your appointment.

## 2024-03-04 ENCOUNTER — Emergency Department (HOSPITAL_BASED_OUTPATIENT_CLINIC_OR_DEPARTMENT_OTHER)
Admission: EM | Admit: 2024-03-04 | Discharge: 2024-03-04 | Disposition: A | Discharge status: Home / Self Care | Source: Home / Self Care | Attending: Emergency Medicine | Admitting: Emergency Medicine

## 2024-03-04 ENCOUNTER — Encounter (HOSPITAL_BASED_OUTPATIENT_CLINIC_OR_DEPARTMENT_OTHER): Payer: Self-pay

## 2024-03-04 ENCOUNTER — Other Ambulatory Visit: Payer: Self-pay

## 2024-03-04 ENCOUNTER — Ambulatory Visit: Payer: Self-pay

## 2024-03-04 DIAGNOSIS — I129 Hypertensive chronic kidney disease with stage 1 through stage 4 chronic kidney disease, or unspecified chronic kidney disease: Secondary | ICD-10-CM | POA: Insufficient documentation

## 2024-03-04 DIAGNOSIS — N179 Acute kidney failure, unspecified: Secondary | ICD-10-CM | POA: Insufficient documentation

## 2024-03-04 DIAGNOSIS — D72829 Elevated white blood cell count, unspecified: Secondary | ICD-10-CM | POA: Insufficient documentation

## 2024-03-04 DIAGNOSIS — N189 Chronic kidney disease, unspecified: Secondary | ICD-10-CM | POA: Diagnosis not present

## 2024-03-04 DIAGNOSIS — Z91010 Allergy to peanuts: Secondary | ICD-10-CM | POA: Insufficient documentation

## 2024-03-04 DIAGNOSIS — B370 Candidal stomatitis: Secondary | ICD-10-CM | POA: Insufficient documentation

## 2024-03-04 DIAGNOSIS — I2510 Atherosclerotic heart disease of native coronary artery without angina pectoris: Secondary | ICD-10-CM | POA: Insufficient documentation

## 2024-03-04 DIAGNOSIS — E1122 Type 2 diabetes mellitus with diabetic chronic kidney disease: Secondary | ICD-10-CM | POA: Diagnosis not present

## 2024-03-04 DIAGNOSIS — E860 Dehydration: Secondary | ICD-10-CM | POA: Insufficient documentation

## 2024-03-04 DIAGNOSIS — Z7982 Long term (current) use of aspirin: Secondary | ICD-10-CM | POA: Diagnosis not present

## 2024-03-04 LAB — URINALYSIS, W/ REFLEX TO CULTURE (INFECTION SUSPECTED)
Bilirubin Urine: NEGATIVE
Glucose, UA: >=500 mg/dL — AB
Hgb urine dipstick: NEGATIVE
Ketones, ur: 15 mg/dL — AB
Leukocytes,Ua: NEGATIVE
Nitrite: NEGATIVE
Protein, ur: NEGATIVE mg/dL
Specific Gravity, Urine: 1.02 (ref 1.005–1.030)
pH: 6 (ref 5.0–8.0)

## 2024-03-04 LAB — CBC WITH DIFFERENTIAL/PLATELET
Abs Immature Granulocytes: 0.05 K/uL (ref 0.00–0.07)
Basophils Absolute: 0.1 K/uL (ref 0.0–0.1)
Basophils Relative: 1 %
Eosinophils Absolute: 0.1 K/uL (ref 0.0–0.5)
Eosinophils Relative: 1 %
HCT: 39.1 % (ref 39.0–52.0)
Hemoglobin: 13.4 g/dL (ref 13.0–17.0)
Immature Granulocytes: 0 %
Lymphocytes Relative: 10 %
Lymphs Abs: 1.2 K/uL (ref 0.7–4.0)
MCH: 31.5 pg (ref 26.0–34.0)
MCHC: 34.3 g/dL (ref 30.0–36.0)
MCV: 92 fL (ref 80.0–100.0)
Monocytes Absolute: 1.4 K/uL — ABNORMAL HIGH (ref 0.1–1.0)
Monocytes Relative: 12 %
Neutro Abs: 8.7 K/uL — ABNORMAL HIGH (ref 1.7–7.7)
Neutrophils Relative %: 76 %
Platelets: 232 K/uL (ref 150–400)
RBC: 4.25 MIL/uL (ref 4.22–5.81)
RDW: 13 % (ref 11.5–15.5)
WBC: 11.5 K/uL — ABNORMAL HIGH (ref 4.0–10.5)
nRBC: 0 % (ref 0.0–0.2)

## 2024-03-04 LAB — COMPREHENSIVE METABOLIC PANEL WITH GFR
ALT: 20 U/L (ref 0–44)
AST: 16 U/L (ref 15–41)
Albumin: 4.7 g/dL (ref 3.5–5.0)
Alkaline Phosphatase: 39 U/L (ref 38–126)
Anion gap: 18 — ABNORMAL HIGH (ref 5–15)
BUN: 25 mg/dL — ABNORMAL HIGH (ref 6–20)
CO2: 24 mmol/L (ref 22–32)
Calcium: 9.4 mg/dL (ref 8.9–10.3)
Chloride: 97 mmol/L — ABNORMAL LOW (ref 98–111)
Creatinine, Ser: 2.41 mg/dL — ABNORMAL HIGH (ref 0.61–1.24)
GFR, Estimated: 31 mL/min — ABNORMAL LOW (ref >60)
Glucose, Bld: 96 mg/dL (ref 70–99)
Potassium: 4.4 mmol/L (ref 3.5–5.1)
Sodium: 139 mmol/L (ref 135–145)
Total Bilirubin: 0.4 mg/dL (ref 0.0–1.2)
Total Protein: 7.6 g/dL (ref 6.5–8.1)

## 2024-03-04 LAB — RAPID HIV SCREEN (HIV 1/2 AB+AG)
HIV 1/2 Antibodies: NONREACTIVE
HIV-1 P24 Antigen - HIV24: NONREACTIVE

## 2024-03-04 LAB — GROUP A STREP BY PCR: Group A Strep by PCR: NOT DETECTED

## 2024-03-04 MED ORDER — FLUCONAZOLE 100 MG PO TABS
100.0000 mg | ORAL_TABLET | Freq: Once | ORAL | Status: DC
  Administered 2024-03-04: 22:00:00 100 mg via ORAL

## 2024-03-04 MED ORDER — DEXAMETHASONE SOD PHOSPHATE PF 10 MG/ML IJ SOLN
10.0000 mg | Freq: Once | INTRAMUSCULAR | Status: AC
  Administered 2024-03-04: 16:00:00 10 mg via INTRAVENOUS

## 2024-03-04 MED ORDER — SODIUM CHLORIDE 0.9 % IV BOLUS
1000.0000 mL | Freq: Once | INTRAVENOUS | Status: AC
  Administered 2024-03-04: 16:00:00 1000 mL via INTRAVENOUS

## 2024-03-04 MED ORDER — FLUCONAZOLE 150 MG PO TABS
150.0000 mg | ORAL_TABLET | Freq: Once | ORAL | Status: AC
  Administered 2024-03-04: 22:00:00 150 mg via ORAL
  Filled 2024-03-04: qty 1

## 2024-03-04 MED ORDER — NYSTATIN 100000 UNIT/ML MT SUSP: 5.0000 mL | Freq: Four times a day (QID) | OROMUCOSAL | 0 refills | Status: AC | PRN

## 2024-03-04 MED ORDER — ALUM & MAG HYDROXIDE-SIMETH 200-200-20 MG/5ML PO SUSP
30.0000 mL | Freq: Once | ORAL | Status: AC
  Administered 2024-03-04: 16:00:00 30 mL via ORAL
  Filled 2024-03-04: qty 30

## 2024-03-04 MED ORDER — FLUCONAZOLE 50 MG PO TABS: 50.0000 mg | ORAL_TABLET | Freq: Every day | ORAL | 0 refills | Status: AC

## 2024-03-04 MED ORDER — LIDOCAINE VISCOUS HCL 2 % MT SOLN
15.0000 mL | Freq: Once | OROMUCOSAL | Status: AC
  Administered 2024-03-04: 16:00:00 15 mL via ORAL
  Filled 2024-03-04: qty 15

## 2024-03-04 MED ADMIN — Sodium Chloride IV Soln 0.9%: 1000 mL | INTRAVENOUS | @ 20:00:00 | NDC 00264580200

## 2024-03-04 NOTE — Telephone Encounter (Signed)
 FYI Only or Action Required?: FYI only for provider: ED advised.  Patient was last seen in primary care on 01/23/2024 by Thedora Garnette HERO, MD.  Called Nurse Triage reporting Facial Swelling (Lip , tongue swelling).  Symptoms began several days ago.  Interventions attempted: Prescription medications: Nystatin .  Symptoms are: unchanged.  Triage Disposition: Go to ED Now (Notify PCP)  Patient/caregiver understands and will follow disposition?: Unsure  Reason for Disposition  Pain in tongue, mouth, or tooth  Answer Assessment - Initial Assessment Questions Patient reports lip, tongue and gum swelling that started 3 days ago- patient states benadryl has helped but swelling is not gone. Patient is also reporting severe pain in mouth and throat. Patient states there are white patches in the mouth and he did contact his dentist and was prescribed Nystatin  yesterday. Patient states he has not been able to eat food due to the pain and swelling- he is drinking and taking medications. ED advised per protocol- patient is not happy with disposition- wanted to come to office.    1. ONSET: When did the swelling start? (e.g., minutes, hours, days)     3 days ago 2. LOCATION: What part of the tongue is swollen?     Whole tongue 3. SEVERITY: How swollen is it?     Whole tongue - effecting voice 4. CAUSE: What do you think is causing the tongue swelling? (e.g., history of angioedema, allergies)     Unsure- allergy/thrush 5. RECURRENT SYMPTOM: Have you had tongue swelling before? If Yes, ask: When was the last time? What happened that time?     no 6. OTHER SYMPTOMS: Do you have any other symptoms? (e.g., breathing difficulty, facial swelling)     White patches on tongue, pain tongue , throat , gum pain  Protocols used: Tongue Swelling-A-AH

## 2024-03-04 NOTE — Telephone Encounter (Signed)
 FYI Only or Action Required?: FYI only for provider: ED advised.  Patient was last seen in primary care on 01/23/2024 by Thedora Garnette HERO, MD.  Called Nurse Triage reporting information only.    Triage Disposition: Information or Advice Only Call  Patient/caregiver understands and will follow disposition?: Yes    Reason for Disposition  Health information question, no triage required and triager able to answer question  Answer Assessment - Initial Assessment Questions 1. REASON FOR CALL: What is the main reason for your call? or How can I best help you?     Patient called back into triage and was advised per the symptoms it was recommended that he is seen urgently in the ED, he agrees with plan ov care and will go now. See previous encounter.  Protocols used: Information Only Call - No Triage-A-AH

## 2024-03-04 NOTE — Telephone Encounter (Signed)
 Called and left a VM to rtn call.  He needs to be seen urgently.  Would suggests going to Urgent Care or ER.  Dm/cma

## 2024-03-04 NOTE — Telephone Encounter (Signed)
 Noted. Dm/cma

## 2024-03-04 NOTE — ED Triage Notes (Addendum)
 Per pt, he woke up Tuesday morning with spontaneous swelling around his mouth. Now has painful swallowing even with drinking water. Pt's mouth/throat with 9/10 pain, bright red, with white patches on tongue and throat. EDP at bedside.

## 2024-03-04 NOTE — ED Notes (Signed)
 Provided pt with urinal and requested urine sample.

## 2024-03-04 NOTE — Telephone Encounter (Signed)
 Copied from CRM #[id number]. Topic: Clinical - Red Word Triage >> Mar 04, 2024 10:27 AM Russell PARAS wrote: Red Word that prompted transfer to Nurse Triage:   Swelling of lips, have gone down with Benadryl Throat is very inflamed Mouth and tongue is covered in a white substance  No fever Feels he may be having allergic reaction   Pt of [place]  Phone call placed to patient-no answer. Voicemail left for patient to call back to Nurse Triage. Will place in callbacks.

## 2024-03-04 NOTE — Discharge Instructions (Signed)
 Your history, exam, and evaluation today are consistent with a severe case of oral thrush which is a fungal infection causing the pain and led to your dehydration and acute kidney injury.  We had a shared decision making conversation and feel comfortable with you being discharged home to use the oral antibiotics to help treat it as well as use the mouthwash to help with symptoms.  Please increase your hydration for the next few days given your kidney injury although we did give you to bags of fluids to help with that as well.  Please follow-up with your primary doctor.  If any symptoms change or worsen acutely, please return to the nearest emergency department.

## 2024-03-04 NOTE — ED Provider Notes (Signed)
 [place] [facility]ERG[facility] DEPARTMENT AT [facility] [place] Provider Note   CSN: [id number] Arrival date & time: 03/04/24  1549     Patient presents with: No chief complaint on file.   KJ IMBERT is a 58 y.o. male.   The history is provided by the patient and medical records. No language interpreter was used.  Sore Throat This is a new problem. The current episode started 2 days ago. The problem occurs constantly. The problem has not changed since onset.Pertinent negatives include no chest pain, no abdominal pain, no headaches and no shortness of breath. Nothing aggravates the symptoms. Nothing relieves the symptoms. He has tried nothing for the symptoms. The treatment provided no relief.       Prior to Admission medications  Medication Sig Start Date End Date Taking? Authorizing Provider  ALPRAZolam  (XANAX ) 0.5 MG tablet TAKE 1/2 TO 1 TABLET BY MOUTH TWICE A DAY AS NEEDED FOR ANXIETY 01/23/24   Thedora Garnette HERO, MD  Ascorbic Acid (VITAMIN C) 1000 MG tablet Take 1,000 mg by mouth daily.    [provider]  aspirin  81 MG tablet Take 81 mg by mouth daily.     [provider]  atorvastatin  (LIPITOR ) 40 MG tablet TAKE 1 TABLET BY MOUTH EVERY DAY 10/09/23   Thedora Garnette HERO, MD  Cinnamon 500 MG TABS Take 3 tablets by mouth daily.    [provider]  clopidogrel  (PLAVIX ) 75 MG tablet TAKE 1 TABLET BY MOUTH EVERY DAY 01/05/24   Thedora Garnette HERO, MD  dapagliflozin  propanediol (FARXIGA ) 5 MG TABS tablet TAKE 1 TABLET (5 MG TOTAL) BY MOUTH DAILY. 10/13/23   Thedora Garnette HERO, MD  ezetimibe  (ZETIA ) 10 MG tablet TAKE 1 TABLET BY MOUTH EVERY DAY 10/09/23   Thedora Garnette HERO, MD  fenofibrate  160 MG tablet TAKE 1 TABLET BY MOUTH EVERY DAY 11/18/23   Thedora Garnette HERO, MD  magnesium oxide (MAG-OX) 400 MG tablet Take 400 mg by mouth daily.    [provider]  metFORMIN  (GLUCOPHAGE ) 500 MG tablet TAKE 1 TABLET BY MOUTH 2 TIMES DAILY WITH A MEAL. 11/18/23   Thedora Garnette HERO, MD   metoprolol  succinate (TOPROL -XL) 50 MG 24 hr tablet Take 1 tablet (50 mg total) by mouth daily. Take with or immediately following a meal. 10/23/23   Thedora Garnette HERO, MD  Multiple Vitamin (THERA) TABS Take 1 tablet by mouth daily.    [provider]  nitroGLYCERIN  (NITROSTAT ) 0.4 MG SL tablet Place 1 tablet (0.4 mg total) under the tongue every 5 (five) minutes as needed for chest pain (X3 DOSES BEFORE CALLING 911). 06/24/22   Dann Candyce RAMAN, MD  Omega-3 Fatty Acids (FISH OIL) 1200 MG CAPS Take 2,400 mg by mouth daily.    [provider]  pantoprazole  (PROTONIX ) 40 MG tablet TAKE 1 TABLET BY MOUTH EVERY DAY 10/09/23   Thedora Garnette HERO, MD  sildenafil  (VIAGRA ) 100 MG tablet Take 1/2 to 1 tablet p.o. as needed 02/25/24   Matilda Garnette, MD  Sodium Sulfate-Mag Sulfate-KCl (SUTAB ) 1479-225-188 MG TABS Use as directed for colonoscopy. MANUFACTURER CODES!! BIN: [id number] PCN: CN GROUP: [id number] MEMBER ID: [id number];MLW AS SECONDARY INSURANCE ;NO PRIOR AUTHORIZATION 12/31/22   Federico Rosario BROCKS, MD  solifenacin  (VESICARE ) 10 MG tablet Take 1 tablet (10 mg total) by mouth daily. 12/01/23   Matilda Garnette, MD  valsartan -hydrochlorothiazide  (DIOVAN -HCT) 160-12.5 MG tablet TAKE 1 TABLET BY MOUTH EVERY DAY 10/09/23   Thedora Garnette HERO, MD  zinc gluconate 50 MG  tablet Take 50 mg by mouth daily.    [provider]    Allergies: Peanut-containing drug products, Codeine, Niacin, Penicillins, and Rosuvastatin    Review of Systems  Constitutional:  Negative for chills, fatigue and fever.  HENT:  Positive for sore throat and trouble swallowing. Negative for congestion, facial swelling (improved from yesterday per pt) and sinus pain.   Eyes:  Negative for visual disturbance.  Respiratory:  Negative for cough, chest tightness, shortness of breath and wheezing.   Cardiovascular:  Negative for chest pain and palpitations.  Gastrointestinal:  Negative for abdominal pain, constipation,  diarrhea, nausea and vomiting.  Genitourinary:  Negative for dysuria and flank pain.  Musculoskeletal:  Negative for back pain, neck pain and neck stiffness.  Skin:  Negative for rash and wound.  Neurological:  Negative for weakness, light-headedness, numbness and headaches.  Psychiatric/Behavioral:  Negative for agitation and confusion.   All other systems reviewed and are negative.   Updated Vital Signs BP (!) 163/88   Pulse 93   Temp 98.8 F (37.1 C) (Oral)   Resp 18   Ht 5' 10 (1.778 m)   Wt 78 kg   SpO2 96%   BMI 24.68 kg/m   Physical Exam Vitals and nursing note reviewed.  Constitutional:      General: He is not in acute distress.    Appearance: He is well-developed. He is not ill-appearing, toxic-appearing or diaphoretic.  HENT:     Mouth/Throat:     Lips: Lesions present.     Mouth: Mucous membranes are dry. Oral lesions present.     Tongue: Lesions present.     Palate: Lesions present.     Pharynx: Uvula midline. Oropharyngeal exudate and posterior oropharyngeal erythema present.     Tonsils: Tonsillar exudate present.     Comments: Patient has white ration exudate all over his oral mucosa on his tongue, palate, posterior oropharynx, gums, and near his lips.  It is dry and cracked.  There is no stridor.  Uvula is midline.  No clear evidence of PTA or RPA. Eyes:     Extraocular Movements: Extraocular movements intact.     Conjunctiva/sclera: Conjunctivae normal.     Pupils: Pupils are equal, round, and reactive to light.  Cardiovascular:     Rate and Rhythm: Normal rate and regular rhythm.     Heart sounds: No murmur heard. Pulmonary:     Effort: Pulmonary effort is normal. No respiratory distress.     Breath sounds: Normal breath sounds. No wheezing, rhonchi or rales.  Chest:     Chest wall: No tenderness.  Abdominal:     General: Abdomen is flat.     Palpations: Abdomen is soft.     Tenderness: There is no abdominal tenderness.  Musculoskeletal:         General: No swelling or tenderness.     Cervical back: Neck supple. No tenderness.  Skin:    General: Skin is warm and dry.     Capillary Refill: Capillary refill takes less than 2 seconds.     Findings: Erythema present.  Neurological:     General: No focal deficit present.     Mental Status: He is alert.  Psychiatric:        Mood and Affect: Mood normal.           (all labs ordered are listed, but only abnormal results are displayed) Labs Reviewed  CBC WITH DIFFERENTIAL/PLATELET - Abnormal; Notable for the following components:  Result Value   WBC 11.5 (*)    Neutro Abs 8.7 (*)    Monocytes Absolute 1.4 (*)    All other components within normal limits  COMPREHENSIVE METABOLIC PANEL WITH GFR - Abnormal; Notable for the following components:   Chloride 97 (*)    BUN 25 (*)    Creatinine, Ser 2.41 (*)    GFR, Estimated 31 (*)    Anion gap 18 (*)    All other components within normal limits  URINALYSIS, W/ REFLEX TO CULTURE (INFECTION SUSPECTED) - Abnormal; Notable for the following components:   Glucose, UA >=500 (*)    Ketones, ur 15 (*)    Bacteria, UA FEW (*)    All other components within normal limits  GROUP A STREP BY PCR  RAPID HIV SCREEN (HIV 1/2 AB+AG)    EKG: None  Radiology: No results found.   Procedures   Medications Ordered in the ED  fluconazole  (DIFLUCAN ) tablet 100 mg (has no administration in time range)  dexamethasone  (DECADRON ) injection 10 mg (10 mg Intravenous Given 03/04/24 1625)  sodium chloride  0.9 % bolus 1,000 mL (0 mLs Intravenous Stopped 03/04/24 1738)  alum & mag hydroxide-simeth (MAALOX/MYLANTA) 200-200-20 MG/5ML suspension 30 mL (30 mLs Oral Given 03/04/24 1616)    And  lidocaine  (XYLOCAINE ) 2 % viscous mouth solution 15 mL (15 mLs Oral Given 03/04/24 1616)  sodium chloride  0.9 % bolus 1,000 mL (0 mLs Intravenous Stopped 03/04/24 2049)                                    Medical Decision Making Amount and/or  Complexity of Data Reviewed Labs: ordered.  Risk OTC drugs. Prescription drug management.    ROGERICK BALDWIN is a 58 y.o. male with a past medical history significant for hypertension, hyperlipidemia, anxiety, GERD, hyperlipidemia, hepatic steatosis, CAD with previous MI, CKD, and diabetes who presents with oral pain and swelling.  According to patient, he noticed some irritation on his tongue on Monday but otherwise did not have significant problem with it.  He reports he woke up Tuesday with significant irritation and pain and swelling in his lips, mouth, and difficulty with swallowing.  He reports that the pain was severe he barely could drink water.  He reports some brief shortness of breath that has resolved.  Never denied chest pain.  Did not have other neck tenderness.  He reports that he did not drink any caustic substances or any chemicals that he knows of.  He reports that he does have an allergy to Christmas trees and pine and has been staying in his living room with the tree but does not think that he has ingested any pine.  He reports that his wife did have a yeast infection several weeks ago but does not think there was any exposure to that infection orally.  He denies fevers, chills, congestion, or cough.  He denies any nausea, vomiting, constipation, diarrhea, or urinary change.  Denies any pruritus or rash elsewhere.  He reports his lips were swollen and he took some Benadryl and that has helped with that but he still has the burning severe.  He reports he was seen by his family ember who is a armed forces operational officer who says he has a medication thrush and he tried nystatin  yesterday but has not helped.  On exam, lungs clear.  Chest nontender.  Back nontender.  No rash on arms  or legs.  No rash on neck.  Externally, patient does not have significant facial swelling or lip swelling however in his oropharynx and mouth he has significant rash.  He has what appears to be a ropelike white rash  all over his tongue, inside of his cheeks, oral mucosa, gums, and posterior oropharynx.  His uvula is midline and did not see evidence of PTA or RPA.  He did not have stridor.  No lacerations but there was some cracked skin and dryness as well.  We had a shared decision-making conversation.  Given the low suspicion for PTA or RPA, we will hold on CT scan as he does not think it feels like it is closing the farther back.  He suspects it does hurt so much due to the rash in his mouth.  We did agree to check some blood work to make sure there is no critical abnormality and also check for HIV although patient does not know of any known HIV exposures.  We also agreed to give him a dose of Decadron  to help with any swelling and tightness in his oropharynx.  We will give him a GI cocktail with lidocaine  and Maalox and we will see what the labs show.  Will give him some fluids as he appears dehydrated from decreased oral intake.  Will get a strep swab given the sore throat he was having earlier.  Anticipate patient likely has new severe strep throat and will need a prolonged course of antiviral treatment.  Anticipate reassessment for workup.     7:39 PM Workup continues to return.  Patient is negative for HIV and his strep test is also negative.  His CBC shows mild leukocytosis likely due to the inflammation we see but his hemoglobin and platelets are normal.  His metabolic panel does show acute kidney injury with creatinine up to 2.41.  He reports that he is only urinated a small amount this morning and has not urinated since and I suspect this is due to his dehydration.  Will give another bag of fluids and we will get a urinalysis.  Patient says he does not want to be admitted for AKI and would rather try going home.  Will give the fluids, check the urine, and likely give prescription for oral antifungal medication with oral fluconazole  and also given prescription for a Magic mouthwash combination as he  reports it did have enough relief of discomfort earlier to be able to tolerate p.o.  If he is feeling better after fluids and gets the urine, anticipate discharge home for outpatient management and follow-up.  Patient feels much better after fluids.  Urinalysis shows some ketones but no clear infection.  I called and spoke with pharmacy who recommended a decreased dose of fluconazole  given his kidney function.  They recommended a single dose of 100 mg today and then 50 mg daily for the next 2 weeks.  We will also give prescription for Magic mouthwash or Dukes mouthwash as pharmacy recommended it be ordered as.  Patient agrees with this plan and will increase his hydration and follow-up with his primary doctor for renal function recheck.  Patient had no other questions or concerns and was discharged in stable condition with understanding return precautions.     Final diagnoses:  AKI (acute kidney injury)  Thrush  Dehydration    ED Discharge Orders          Ordered    magic mouthwash (nystatin , hydrocortisone, diphenhydrAMINE, lidocaine ) suspension  4 times  daily PRN        03/04/24 2212    fluconazole  (DIFLUCAN ) 50 MG tablet  Daily        03/04/24 2212           Clinical Impression: 1. AKI (acute kidney injury)   2. Thrush   3. Dehydration     Disposition: Discharge  Condition: Good  I have discussed the results, Dx and Tx plan with the pt(& family if present). He/she/they expressed understanding and agree(s) with the plan. Discharge instructions discussed at great length. Strict return precautions discussed and pt &/or family have verbalized understanding of the instructions. No further questions at time of discharge.    New Prescriptions   FLUCONAZOLE  (DIFLUCAN ) 50 MG TABLET    Take 1 tablet (50 mg total) by mouth daily for 14 days.   MAGIC MOUTHWASH (NYSTATIN , HYDROCORTISONE, DIPHENHYDRAMINE, LIDOCAINE ) SUSPENSION    Swish and spit 5 mLs 4 (four) times daily as needed  for up to 14 days for mouth pain.    Follow Up: Thedora Garnette HERO, MD [address] [place] [address] [phone]     [facility] Emergency Department at [place] [address] [place]  [address] [phone]        Sherley Mckenney, Lonni PARAS, MD 03/04/24 2216

## 2024-03-10 LAB — HIGH SENSITIVITY CRP: CRP, High Sensitivity: 56.23 mg/L — ABNORMAL HIGH (ref 0.00–3.00)

## 2024-03-12 ENCOUNTER — Ambulatory Visit: Payer: Self-pay | Admitting: Internal Medicine

## 2024-03-15 ENCOUNTER — Other Ambulatory Visit: Payer: Self-pay

## 2024-03-15 DIAGNOSIS — R7982 Elevated C-reactive protein (CRP): Secondary | ICD-10-CM

## 2024-03-29 ENCOUNTER — Encounter: Payer: Self-pay | Admitting: Urology

## 2024-04-05 ENCOUNTER — Other Ambulatory Visit: Payer: Self-pay | Admitting: Urology

## 2024-04-05 DIAGNOSIS — E291 Testicular hypofunction: Secondary | ICD-10-CM

## 2024-04-05 MED ORDER — TESTOSTERONE 20.25 MG/ACT (1.62%) TD GEL: 1.0000 | Freq: Every day | TRANSDERMAL | 3 refills | Status: AC

## 2024-04-15 ENCOUNTER — Ambulatory Visit: Admitting: Internal Medicine

## 2024-05-03 ENCOUNTER — Ambulatory Visit: Admitting: Pharmacist Clinician (PhC)/ Clinical Pharmacy Specialist

## 2024-08-23 ENCOUNTER — Other Ambulatory Visit

## 2024-08-25 ENCOUNTER — Ambulatory Visit: Admitting: Urology

## 2024-09-14 ENCOUNTER — Ambulatory Visit: Admitting: Physician Assistant

## 2024-10-18 ENCOUNTER — Ambulatory Visit: Admitting: Dermatology
